# Patient Record
Sex: Female | Born: 1995 | Race: White | Hispanic: No | Marital: Married | Smoking: Never smoker
Health system: Southern US, Community
[De-identification: ages and names within clinical notes are randomized; demographics above are authoritative.]

## PROBLEM LIST (undated history)

## (undated) ENCOUNTER — Inpatient Hospital Stay (HOSPITAL_COMMUNITY): Payer: Self-pay

## (undated) DIAGNOSIS — N183 Chronic kidney disease, stage 3 unspecified: Secondary | ICD-10-CM

## (undated) DIAGNOSIS — N390 Urinary tract infection, site not specified: Secondary | ICD-10-CM

## (undated) DIAGNOSIS — E079 Disorder of thyroid, unspecified: Secondary | ICD-10-CM

## (undated) DIAGNOSIS — E0590 Thyrotoxicosis, unspecified without thyrotoxic crisis or storm: Secondary | ICD-10-CM

## (undated) DIAGNOSIS — E039 Hypothyroidism, unspecified: Secondary | ICD-10-CM

## (undated) DIAGNOSIS — M797 Fibromyalgia: Secondary | ICD-10-CM

## (undated) HISTORY — PX: WISDOM TOOTH EXTRACTION: SHX21

## (undated) HISTORY — PX: APPENDECTOMY: SHX54

## (undated) HISTORY — DX: Hypothyroidism, unspecified: E03.9

---
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
## 2015-09-23 ENCOUNTER — Encounter: Payer: Self-pay | Admitting: Obstetrics and Gynecology

## 2015-10-09 ENCOUNTER — Ambulatory Visit (INDEPENDENT_AMBULATORY_CARE_PROVIDER_SITE_OTHER): Payer: Medicaid Other | Admitting: Obstetrics and Gynecology

## 2015-10-09 ENCOUNTER — Encounter: Payer: Self-pay | Admitting: Obstetrics and Gynecology

## 2015-10-09 VITALS — BP 100/60 | HR 84 | Ht 59.0 in | Wt 172.0 lb

## 2015-10-09 DIAGNOSIS — E039 Hypothyroidism, unspecified: Secondary | ICD-10-CM | POA: Diagnosis not present

## 2015-10-09 DIAGNOSIS — N970 Female infertility associated with anovulation: Secondary | ICD-10-CM

## 2015-10-09 DIAGNOSIS — N926 Irregular menstruation, unspecified: Secondary | ICD-10-CM | POA: Diagnosis not present

## 2015-10-09 NOTE — Progress Notes (Signed)
Patient ID: Hailey Thompson, female   DOB: 1995-11-21, 20 y.o.   MRN: [id number]   [facility] Visit  @DATE @            Patient name: Hailey Thompson MRN [id number]  Date of birth: 02-09-1996  CC & HPI:  Hailey Thompson is a 20 y.o. female presenting today for shortening menstrual cycles. Pt states her cycles have gotten shorter and she is concerned for fertility. She has been tracking her periods through an app and notes her cycles have decreased from 29-32 to 22-29 days since January 2017. Pt has also noticed new spotting then an increase in bleeding during periods. Last period June 24-30. Pt states she does not skip periods. Pt has one child delivered through cesarean section due to breech presentation. She reports complications with her incision following cesarean section with 3 large stromas. Pt has recently adjusted levothyroxine dosage levels to 112 mg/day, managed by Roberto Scales at [facility].   ROS:  Review of Systems  Genitourinary:       Short menstrual cycles  All other systems reviewed and are negative.   Pertinent History Reviewed:   Reviewed: Significant for cesarean section Medical         Past Medical History  Diagnosis Date  . Migraine   . Thyroid disease   . UTI (lower urinary tract infection)   . Overactive thyroid gland                               Surgical Hx:    Past Surgical History  Procedure Laterality Date  . Appendectomy    . Cesarean section     Medications: Reviewed & Updated - see associated section                       Current outpatient prescriptions:  .  acetaminophen (TYLENOL) 500 MG tablet, Take 500 mg by mouth every 6 (six) hours as needed for headache., Disp: , Rfl:  .  levothyroxine (SYNTHROID, LEVOTHROID) 125 MCG tablet, Take 1 tablet (125 mcg total) by mouth daily., Disp: 30 tablet, Rfl: 6 .  [DISCONTINUED] famotidine (PEPCID) 20 MG tablet, Take 1 tablet (20 mg total) by mouth 2 (two) times daily., Disp: 20 tablet, Rfl:  0   Social History: Reviewed -  reports that she has never smoked. She has never used smokeless tobacco.  Objective Findings:  Vitals: Blood pressure 100/60, pulse 84, height 4\' 11"  (1.499 m), weight 172 lb (78.019 kg), last menstrual period 09/19/2015, unknown if currently breastfeeding.  Physical Examination:  General appearance - alert, well appearing, and in no distress, oriented to person, place, and time and overweight Skin - normal coloration and turgor, no rashes, no suspicious skin lesions noted   Assessment & Plan:   A:  1. Hypothyroidism 2. Possible oligoovulation  P:  1. Continue to manage hypothyroidism 2. Recommended My [email or web address] [email or web address] and ovulation guides 3. Draw progesterone level 7 days after ovulation     By signing my name below, I, Rowan Blase, attest that this documentation has been prepared under the direction and in the presence of Jonnie Kind, MD . Electronically Signed: Rowan Blase, Scribe. 10/09/2015. 10:04 AM.  Alecia Lemming I personally performed the services described in this documentation, which was SCRIBED in my presence. The recorded information has been reviewed and considered accurate. It has been edited as necessary during review. Jonnie Kind, MD

## 2015-10-22 ENCOUNTER — Emergency Department (HOSPITAL_COMMUNITY)
Admission: EM | Admit: 2015-10-22 | Discharge: 2015-10-23 | Disposition: A | Discharge status: Home / Self Care | Payer: Medicaid Other | Attending: Emergency Medicine | Admitting: Emergency Medicine

## 2015-10-22 ENCOUNTER — Encounter (HOSPITAL_COMMUNITY): Payer: Self-pay

## 2015-10-22 ENCOUNTER — Emergency Department (HOSPITAL_COMMUNITY): Payer: Medicaid Other

## 2015-10-22 DIAGNOSIS — M549 Dorsalgia, unspecified: Secondary | ICD-10-CM | POA: Insufficient documentation

## 2015-10-22 DIAGNOSIS — R1011 Right upper quadrant pain: Secondary | ICD-10-CM

## 2015-10-22 DIAGNOSIS — E039 Hypothyroidism, unspecified: Secondary | ICD-10-CM | POA: Insufficient documentation

## 2015-10-22 DIAGNOSIS — K8020 Calculus of gallbladder without cholecystitis without obstruction: Secondary | ICD-10-CM | POA: Insufficient documentation

## 2015-10-22 DIAGNOSIS — Z79899 Other long term (current) drug therapy: Secondary | ICD-10-CM | POA: Diagnosis not present

## 2015-10-22 LAB — URINALYSIS, ROUTINE W REFLEX MICROSCOPIC
Bilirubin Urine: NEGATIVE
GLUCOSE, UA: NEGATIVE mg/dL
KETONES UR: NEGATIVE mg/dL
Nitrite: NEGATIVE
PH: 6 (ref 5.0–8.0)
PROTEIN: NEGATIVE mg/dL
Specific Gravity, Urine: 1.015 (ref 1.005–1.030)

## 2015-10-22 LAB — COMPREHENSIVE METABOLIC PANEL
ALT: 19 U/L (ref 14–54)
ANION GAP: 4 — AB (ref 5–15)
AST: 22 U/L (ref 15–41)
Albumin: 3.3 g/dL — ABNORMAL LOW (ref 3.5–5.0)
Alkaline Phosphatase: 93 U/L (ref 38–126)
BILIRUBIN TOTAL: 0.2 mg/dL — AB (ref 0.3–1.2)
BUN: 13 mg/dL (ref 6–20)
CHLORIDE: 105 mmol/L (ref 101–111)
CO2: 25 mmol/L (ref 22–32)
Calcium: 8.1 mg/dL — ABNORMAL LOW (ref 8.9–10.3)
Creatinine, Ser: 0.78 mg/dL (ref 0.44–1.00)
Glucose, Bld: 105 mg/dL — ABNORMAL HIGH (ref 65–99)
POTASSIUM: 3.9 mmol/L (ref 3.5–5.1)
Sodium: 134 mmol/L — ABNORMAL LOW (ref 135–145)
TOTAL PROTEIN: 7.6 g/dL (ref 6.5–8.1)

## 2015-10-22 LAB — CBC
HEMATOCRIT: 35 % — AB (ref 36.0–46.0)
Hemoglobin: 10.7 g/dL — ABNORMAL LOW (ref 12.0–15.0)
MCH: 24 pg — ABNORMAL LOW (ref 26.0–34.0)
MCHC: 30.6 g/dL (ref 30.0–36.0)
MCV: 78.7 fL (ref 78.0–100.0)
Platelets: 324 10*3/uL (ref 150–400)
RBC: 4.45 MIL/uL (ref 3.87–5.11)
RDW: 15.1 % (ref 11.5–15.5)
WBC: 13.6 10*3/uL — AB (ref 4.0–10.5)

## 2015-10-22 LAB — URINE MICROSCOPIC-ADD ON

## 2015-10-22 LAB — LIPASE, BLOOD: LIPASE: 173 U/L — AB (ref 11–51)

## 2015-10-22 LAB — PREGNANCY, URINE: Preg Test, Ur: NEGATIVE

## 2015-10-22 MED ORDER — IOPAMIDOL (ISOVUE-300) INJECTION 61%
100.0000 mL | Freq: Once | INTRAVENOUS | Status: AC | PRN
  Administered 2015-10-23: 100 mL via INTRAVENOUS

## 2015-10-22 NOTE — ED Triage Notes (Signed)
Reports of upper abdominal pain that radiates to back. Denies n/v/d. Reports pain increases after eating.

## 2015-10-22 NOTE — ED Provider Notes (Signed)
[facility] DEPT Provider Note   CSN: [id number] Arrival date & time: 10/22/15  2104  First Provider Contact:  First MD Initiated Contact with Patient 10/22/15 2252        History   Chief Complaint Chief Complaint  Patient presents with  . Abdominal Pain    HPI Hailey Thompson is a 20 y.o. female.    Pain started yesterday about lunch. The pain felt like a pushing sensation. Pain got worse last night and even more intense today. No vomiting, No diarrhea, No constipation. No injury to abd or back. Pt has similar pain during pregnancy. It was suggested that she may have a gall bladder problem. She did not have this checked during this time.  Pt reports having tatar tots for lunch yesterday. Dinner was potatoes and gravy, and steak and squash. Pt does not eat breakfast.  No blood in the stool or the urine.   The history is provided by the patient.    Past Medical History:  Diagnosis Date  . Migraine   . Overactive thyroid gland   . Thyroid disease   . UTI (lower urinary tract infection)     Patient Active Problem List   Diagnosis Date Noted  . Irregular menses 05/01/2013  . Vitiligo 10/25/2011  . Short stature 07/19/2011  . Hypothyroidism 07/19/2011  . Obesity 07/19/2011  . Hypercholesterolemia with hypertriglyceridemia 07/19/2011    Past Surgical History:  Procedure Laterality Date  . APPENDECTOMY    . CESAREAN SECTION      OB History    Gravida Para Term Preterm AB Living   1             SAB TAB Ectopic Multiple Live Births                   Home Medications    Prior to Admission medications   Medication Sig Start Date End Date Taking? Authorizing Provider  acetaminophen (TYLENOL) 500 MG tablet Take 500 mg by mouth every 6 (six) hours as needed for headache.    Historical Provider, MD  levothyroxine (SYNTHROID, LEVOTHROID) 125 MCG tablet Take 1 tablet (125 mcg total) by mouth daily. 08/09/13   Lelon Huh, MD    Family History Family History    Problem Relation Age of Onset  . Short stature Sister     s/p kidney failure  . Kidney disease Sister   . Deafness Sister   . Diabetes Mother     type 1 dx as young child. now with multiple complications  . Thyroid disease Mother   . Deep vein thrombosis Mother   . Cancer Maternal Grandmother     breast  . Cancer Maternal Grandfather   . Short stature Sister     paternal half sister 42'8"    Social History Social History  Substance Use Topics  . Smoking status: Never Smoker  . Smokeless tobacco: Never Used  . Alcohol use No     Allergies   Mushroom extract complex; Chocolate; Oxycodone; and Prednisone   Review of Systems Review of Systems  Gastrointestinal: Positive for abdominal pain. Negative for blood in stool, constipation, nausea and vomiting.  Musculoskeletal: Positive for back pain.  All other systems reviewed and are negative.    Physical Exam Updated Vital Signs BP 119/74   Pulse 93   Temp 98.4 F (36.9 C) (Oral)   Resp 20   Ht 4\' 6"  (1.372 m)   Wt 78 kg   LMP 09/13/2015   SpO2  99%   BMI 41.47 kg/m   Physical Exam  Constitutional: She is oriented to person, place, and time. She appears well-developed and well-nourished.  Non-toxic appearance.  HENT:  Head: Normocephalic.  Right Ear: Tympanic membrane and external ear normal.  Left Ear: Tympanic membrane and external ear normal.  Eyes: EOM and lids are normal. Pupils are equal, round, and reactive to light.  Neck: Normal range of motion. Neck supple. Carotid bruit is not present.  Cardiovascular: Normal rate, regular rhythm, normal heart sounds, intact distal pulses and normal pulses.   Pulmonary/Chest: Breath sounds normal. No respiratory distress.  Abdominal: Soft. Bowel sounds are normal. There is tenderness in the right upper quadrant and epigastric area. There is no rigidity, no guarding and no CVA tenderness.  Musculoskeletal: Normal range of motion.  Lymphadenopathy:       Head (right  side): No submandibular adenopathy present.       Head (left side): No submandibular adenopathy present.    She has no cervical adenopathy.  Neurological: She is alert and oriented to person, place, and time. She has normal strength. No cranial nerve deficit or sensory deficit.  Skin: Skin is warm and dry.  Psychiatric: She has a normal mood and affect. Her speech is normal.  Nursing note and vitals reviewed.    ED Treatments / Results  Labs (all labs ordered are listed, but only abnormal results are displayed) Labs Reviewed  LIPASE, BLOOD - Abnormal; Notable for the following:       Result Value   Lipase 173 (*)    All other components within normal limits  COMPREHENSIVE METABOLIC PANEL - Abnormal; Notable for the following:    Sodium 134 (*)    Glucose, Bld 105 (*)    Calcium 8.1 (*)    Albumin 3.3 (*)    Total Bilirubin 0.2 (*)    Anion gap 4 (*)    All other components within normal limits  CBC - Abnormal; Notable for the following:    WBC 13.6 (*)    Hemoglobin 10.7 (*)    HCT 35.0 (*)    MCH 24.0 (*)    All other components within normal limits  URINALYSIS, ROUTINE W REFLEX MICROSCOPIC (NOT AT [facility]) - Abnormal; Notable for the following:    Hgb urine dipstick MODERATE (*)    Leukocytes, UA TRACE (*)    All other components within normal limits  URINE MICROSCOPIC-ADD ON - Abnormal; Notable for the following:    Squamous Epithelial / LPF 6-30 (*)    Bacteria, UA MANY (*)    All other components within normal limits  PREGNANCY, URINE    EKG  EKG Interpretation None       Radiology No results found.  Procedures Procedures (including critical care time)  Medications Ordered in ED Medications - No data to display   Initial Impression / Assessment and Plan / ED Course  I have reviewed the triage vital signs and the nursing notes.  Pertinent labs & imaging results that were available during my care of the patient were reviewed by me and considered in my  medical decision making (see chart for details).  Clinical Course    *I have reviewed nursing notes, vital signs, and all appropriate lab and imaging results for this patient.  Final Clinical Impressions(s) / ED Diagnoses  There is elevation of the white blood cell count. There is elevation of the lipase at 173. Urine pregnancy is negative. Urinalysis is negative for kidney stone  or urinary tract infection. CT scan reveals a contracted gallbladder, but otherwise within normal limits.  The plan at this time is for the patient to have an ultrasound of the right upper quadrant as an outpatient. The patient is scheduled for 3:15 on July 28. I discussed with the patient the importance of remaining nothing by mouth after 10 AM. I also discussed with patient the need to come to the waiting area of the emergency department after her tests to receive her results. I discussed with the patient the possibility of need for consultation with general surgery, and/or GI after the results of the ultrasound and then released. Patient is in agreement with this discharge plan. A to go pack of Zofran is been given to the patient.    Final diagnoses:  Right upper quadrant pain    New Prescriptions New Prescriptions   No medications on file     Lily Kocher, PA-C 10/23/15 0122    Daleen Bo, MD 10/27/15 [phone]

## 2015-10-23 ENCOUNTER — Telehealth (HOSPITAL_COMMUNITY): Payer: Self-pay | Admitting: Emergency Medicine

## 2015-10-23 ENCOUNTER — Ambulatory Visit (HOSPITAL_COMMUNITY)
Admit: 2015-10-23 | Discharge: 2015-10-23 | Disposition: A | Discharge status: Home / Self Care | Payer: Medicaid Other | Attending: Physician Assistant | Admitting: Physician Assistant

## 2015-10-23 DIAGNOSIS — R1011 Right upper quadrant pain: Secondary | ICD-10-CM | POA: Insufficient documentation

## 2015-10-23 DIAGNOSIS — K8020 Calculus of gallbladder without cholecystitis without obstruction: Secondary | ICD-10-CM

## 2015-10-23 MED ORDER — ONDANSETRON 4 MG PREPACK (~~LOC~~): ORAL_TABLET | ORAL | 0 refills | Status: DC

## 2015-10-23 MED ORDER — HYDROCODONE-ACETAMINOPHEN 5-325 MG PO TABS: 1.0000 | ORAL_TABLET | ORAL | 0 refills | Status: DC | PRN

## 2015-10-23 NOTE — Telephone Encounter (Signed)
Pt returning for RUQ [place]. Cholelithiasis. No pericholecystic fluid. Normal diameter CBD. Symptoms improved. Script for PRN pain meds provided. New instructions with general surgery contact information. Return precautions discussed.

## 2015-10-23 NOTE — Discharge Instructions (Addendum)
Your white blood cell count is elevated. Your lipase, a test that monitors your pancreas is also elevated. Your CT scan reveals your gallbladder to be contracted. An ultrasound of your abdomen has been ordered. Please come to the radiology department at 3:15 on July 28. Please do not eat or drink after 10 AM. Please use Tylenol for soreness or fever. Use Zofran for nausea.

## 2015-10-23 NOTE — ED Provider Notes (Signed)
Pt returned for RUQ [place]. Cholelithiasis. No GB wall thickening. No pericholecystic fluid. CBD normal diameter. Will be discharged with PRN pain meds and general surgical contact information. Results and return precautions discussed.    Virgel Manifold, MD 10/23/15 1524

## 2015-11-03 NOTE — Patient Instructions (Signed)
Hailey Thompson  11/03/2015     @PREFPERIOPPHARMACY @   Your procedure is scheduled on  11/06/2015   Report to [facility] at  730  A.M.  Call this number if you have problems the morning of surgery:  [phone]   Remember:  Do not eat food or drink liquids after midnight.  Take these medicines the morning of surgery with A SIP OF WATER norco, levothyroxine, zofran.   Do not wear jewelry, make-up or nail polish.  Do not wear lotions, powders, or perfumes.  You may wear deoderant.  Do not shave 48 hours prior to surgery.  Men may shave face and neck.  Do not bring valuables to the hospital.  [facility] is not responsible for any belongings or valuables.  Contacts, dentures or bridgework may not be worn into surgery.  Leave your suitcase in the car.  After surgery it may be brought to your room.  For patients admitted to the hospital, discharge time will be determined by your treatment team.  Patients discharged the day of surgery will not be allowed to drive home.   Name and phone number of your driver:   family Special instructions:  none  Please read over the following fact sheets that you were given. Anesthesia Post-op Instructions and Care and Recovery After Surgery       Laparoscopic Cholecystectomy Laparoscopic cholecystectomy is surgery to remove the gallbladder. The gallbladder is located in the upper right part of the abdomen, behind the liver. It is a storage sac for bile, which is produced in the liver. Bile aids in the digestion and absorption of fats. Cholecystectomy is often done for inflammation of the gallbladder (cholecystitis). This condition is usually caused by a buildup of gallstones (cholelithiasis) in the gallbladder. Gallstones can block the flow of bile, and that can result in inflammation and pain. In severe cases, emergency surgery may be required. If emergency surgery is not required, you will have time to prepare for the  procedure. Laparoscopic surgery is an alternative to open surgery. Laparoscopic surgery has a shorter recovery time. Your common bile duct may also need to be examined during the procedure. If stones are found in the common bile duct, they may be removed. LET [facility] CARE PROVIDER KNOW ABOUT:  Any allergies you have.  All medicines you are taking, including vitamins, herbs, eye drops, creams, and over-the-counter medicines.  Previous problems you or members of your family have had with the use of anesthetics.  Any blood disorders you have.  Previous surgeries you have had.  Any medical conditions you have. RISKS AND COMPLICATIONS Generally, this is a safe procedure. However, problems may occur, including:  Infection.  Bleeding.  Allergic reactions to medicines.  Damage to other structures or organs.  A stone remaining in the common bile duct.  A bile leak from the cyst duct that is clipped when your gallbladder is removed.  The need to convert to open surgery, which requires a larger incision in the abdomen. This may be necessary if your surgeon thinks that it is not safe to continue with a laparoscopic procedure. BEFORE THE PROCEDURE  Ask your health care provider about:  Changing or stopping your regular medicines. This is especially important if you are taking diabetes medicines or blood thinners.  Taking medicines such as aspirin and ibuprofen. These medicines can thin your blood. Do not take these medicines before your procedure if your health care provider  instructs you not to.  Follow instructions from your health care provider about eating or drinking restrictions.  Let your health care provider know if you develop a cold or an infection before surgery.  Plan to have someone take you home after the procedure.  Ask your health care provider how your surgical site will be marked or identified.  You may be given antibiotic medicine to help prevent  infection. PROCEDURE  To reduce your risk of infection:  Your health care team will wash or sanitize their hands.  Your skin will be washed with soap.  An IV tube may be inserted into one of your veins.  You will be given a medicine to make you fall asleep (general anesthetic).  A breathing tube will be placed in your mouth.  The surgeon will make several small cuts (incisions) in your abdomen.  A thin, lighted tube (laparoscope) that has a tiny camera on the end will be inserted through one of the small incisions. The camera on the laparoscope will send a picture to a TV screen (monitor) in the operating room. This will give the surgeon a good view inside your abdomen.  A gas will be pumped into your abdomen. This will expand your abdomen to give the surgeon more room to perform the surgery.  Other tools that are needed for the procedure will be inserted through the other incisions. The gallbladder will be removed through one of the incisions.  After your gallbladder has been removed, the incisions will be closed with stitches (sutures), staples, or skin glue.  Your incisions may be covered with a bandage (dressing). The procedure may vary among health care providers and hospitals. AFTER THE PROCEDURE  Your blood pressure, heart rate, breathing rate, and blood oxygen level will be monitored often until the medicines you were given have worn off.  You will be given medicines as needed to control your pain.   This information is not intended to replace advice given to you by your health care provider. Make sure you discuss any questions you have with your health care provider.   Document Released: 03/14/2005 Document Revised: 12/03/2014 Document Reviewed: 10/24/2012 Elsevier Interactive Patient Education 2016 [employer]  Laparoscopic Cholecystectomy, Care After Refer to this sheet in the next few weeks. These instructions provide you with information about caring for  yourself after your procedure. Your health care provider may also give you more specific instructions. Your treatment has been planned according to current medical practices, but problems sometimes occur. Call your health care provider if you have any problems or questions after your procedure. WHAT TO EXPECT AFTER THE PROCEDURE After your procedure, it is common to have:  Pain at your incision sites. You will be given pain medicines to control your pain.  Mild nausea or vomiting. This should improve after the first 24 hours.  Bloating and possible shoulder pain from the gas that was used during the procedure. This will improve after the first 24 hours. HOME CARE INSTRUCTIONS Incision Care  Follow instructions from your health care provider about how to take care of your incisions. Make sure you:  Wash your hands with soap and water before you change your bandage (dressing). If soap and water are not available, use hand sanitizer.  Change your dressing as told by your health care provider.  Leave stitches (sutures), skin glue, or adhesive strips in place. These skin closures may need to be in place for 2 weeks or longer. If adhesive strip edges start  to loosen and curl up, you may trim the loose edges. Do not remove adhesive strips completely unless your health care provider tells you to do that.  Do not take baths, swim, or use a hot tub until your health care provider approves. Ask your health care provider if you can take showers. You may only be allowed to take sponge baths for bathing. General Instructions  Take over-the-counter and prescription medicines only as told by your health care provider.  Do not drive or operate heavy machinery while taking prescription pain medicine.  Return to your normal diet as told by your health care provider.  Do not lift anything that is heavier than 10 lb (4.5 kg).  Do not play contact sports for one week or until your health care provider  approves. SEEK MEDICAL CARE IF:   You have redness, swelling, or pain at the site of your incision.  You have fluid, blood, or pus coming from your incision.  You notice a bad smell coming from your incision area.  Your surgical incisions break open.  You have a fever. SEEK IMMEDIATE MEDICAL CARE IF:  You develop a rash.  You have difficulty breathing.  You have chest pain.  You have increasing pain in your shoulders (shoulder strap areas).  You faint or have dizzy episodes while you are standing.  You have severe pain in your abdomen.  You have nausea or vomiting that lasts for more than one day.   This information is not intended to replace advice given to you by your health care provider. Make sure you discuss any questions you have with your health care provider.   Document Released: 03/14/2005 Document Revised: 12/03/2014 Document Reviewed: 10/24/2012 Elsevier Interactive Patient Education 2016 [place] Anesthesia, Adult General anesthesia is a sleep-like state of non-feeling produced by medicines (anesthetics). General anesthesia prevents you from being alert and feeling pain during a medical procedure. Your caregiver may recommend general anesthesia if your procedure:  Is long.  Is painful or uncomfortable.  Would be frightening to see or hear.  Requires you to be still.  Affects your breathing.  Causes significant blood loss. LET YOUR CAREGIVER KNOW ABOUT:  Allergies to food or medicine.  Medicines taken, including vitamins, herbs, eyedrops, over-the-counter medicines, and creams.  Use of steroids (by mouth or creams).  Previous problems with anesthetics or numbing medicines, including problems experienced by relatives.  History of bleeding problems or blood clots.  Previous surgeries and types of anesthetics received.  Possibility of pregnancy, if this applies.  Use of cigarettes, alcohol, or illegal drugs.  Any health  condition(s), especially diabetes, sleep apnea, and high blood pressure. RISKS AND COMPLICATIONS General anesthesia rarely causes complications. However, if complications do occur, they can be life threatening. Complications include:  A lung infection.  A stroke.  A heart attack.  Waking up during the procedure. When this occurs, the patient may be unable to move and communicate that he or she is awake. The patient may feel severe pain. Older adults and adults with serious medical problems are more likely to have complications than adults who are young and healthy. Some complications can be prevented by answering all of your caregiver's questions thoroughly and by following all pre-procedure instructions. It is important to tell your caregiver if any of the pre-procedure instructions, especially those related to diet, were not followed. Any food or liquid in the stomach can cause problems when you are under general anesthesia. BEFORE THE PROCEDURE  Ask your  caregiver if you will have to spend the night at the hospital. If you will not have to spend the night, arrange to have an adult drive you and stay with you for 24 hours.  Follow your caregiver's instructions if you are taking dietary supplements or medicines. Your caregiver may tell you to stop taking them or to reduce your dosage.  Do not smoke for as long as possible before your procedure. If possible, stop smoking 3-6 weeks before the procedure.  Do not take new dietary supplements or medicines within 1 week of your procedure unless your caregiver approves them.  Do not eat within 8 hours of your procedure or as directed by your caregiver. Drink only clear liquids, such as water, black coffee (without milk or cream), and fruit juices (without pulp).  Do not drink within 3 hours of your procedure or as directed by your caregiver.  You may brush your teeth on the morning of the procedure, but make sure to spit out the toothpaste and  water when finished. PROCEDURE  You will receive anesthetics through a mask, through an intravenous (IV) access tube, or through both. A doctor who specializes in anesthesia (anesthesiologist) or a nurse who specializes in anesthesia (nurse anesthetist) or both will stay with you throughout the procedure to make sure you remain unconscious. He or she will also watch your blood pressure, pulse, and oxygen levels to make sure that the anesthetics do not cause any problems. Once you are asleep, a breathing tube or mask may be used to help you breathe. AFTER THE PROCEDURE You will wake up after the procedure is complete. You may be in the room where the procedure was performed or in a recovery area. You may have a sore throat if a breathing tube was used. You may also feel:  Dizzy.  Weak.  Drowsy.  Confused.  Nauseous.  Cold. These are all normal responses and can be expected to last for up to 24 hours after the procedure is complete. A caregiver will tell you when you are ready to go home. This will usually be when you are fully awake and in stable condition.   This information is not intended to replace advice given to you by your health care provider. Make sure you discuss any questions you have with your health care provider.   Document Released: 06/21/2007 Document Revised: 04/04/2014 Document Reviewed: 07/13/2011 Elsevier Interactive Patient Education 2016 [place] Anesthesia, Adult, Care After Refer to this sheet in the next few weeks. These instructions provide you with information on caring for yourself after your procedure. Your health care provider may also give you more specific instructions. Your treatment has been planned according to current medical practices, but problems sometimes occur. Call your health care provider if you have any problems or questions after your procedure. WHAT TO EXPECT AFTER THE PROCEDURE After the procedure, it is typical to  experience:  Sleepiness.  Nausea and vomiting. HOME CARE INSTRUCTIONS  For the first 24 hours after general anesthesia:  Have a responsible person with you.  Do not drive a car. If you are alone, do not take public transportation.  Do not drink alcohol.  Do not take medicine that has not been prescribed by your health care provider.  Do not sign important papers or make important decisions.  You may resume a normal diet and activities as directed by your health care provider.  Change bandages (dressings) as directed.  If you have questions or problems that  seem related to general anesthesia, call the hospital and ask for the anesthetist or anesthesiologist on call. SEEK MEDICAL CARE IF:  You have nausea and vomiting that continue the day after anesthesia.  You develop a rash. SEEK IMMEDIATE MEDICAL CARE IF:   You have difficulty breathing.  You have chest pain.  You have any allergic problems.   This information is not intended to replace advice given to you by your health care provider. Make sure you discuss any questions you have with your health care provider.   Document Released: 06/20/2000 Document Revised: 04/04/2014 Document Reviewed: 07/13/2011 Elsevier Interactive Patient Education [employer].

## 2015-11-04 ENCOUNTER — Encounter (HOSPITAL_COMMUNITY): Payer: Self-pay

## 2015-11-04 ENCOUNTER — Encounter (HOSPITAL_COMMUNITY)
Admission: RE | Admit: 2015-11-04 | Discharge: 2015-11-04 | Disposition: A | Discharge status: Home / Self Care | Payer: Medicaid Other | Source: Ambulatory Visit | Attending: Surgery | Admitting: Surgery

## 2015-11-04 DIAGNOSIS — Z01812 Encounter for preprocedural laboratory examination: Secondary | ICD-10-CM | POA: Insufficient documentation

## 2015-11-04 LAB — HCG, SERUM, QUALITATIVE: Preg, Serum: NEGATIVE

## 2015-11-04 NOTE — Pre-Procedure Instructions (Signed)
Patient given information to sign up for my chart at home. 

## 2015-11-06 ENCOUNTER — Ambulatory Visit (HOSPITAL_COMMUNITY): Payer: Medicaid Other | Admitting: Anesthesiology

## 2015-11-06 ENCOUNTER — Encounter (HOSPITAL_COMMUNITY)
Admission: RE | Disposition: A | Discharge status: Home / Self Care | Payer: Self-pay | Source: Ambulatory Visit | Attending: Surgery

## 2015-11-06 ENCOUNTER — Ambulatory Visit (HOSPITAL_COMMUNITY): Payer: Medicaid Other

## 2015-11-06 ENCOUNTER — Ambulatory Visit (HOSPITAL_COMMUNITY)
Admission: RE | Admit: 2015-11-06 | Discharge: 2015-11-06 | Disposition: A | Discharge status: Home / Self Care | Payer: Medicaid Other | Source: Ambulatory Visit | Attending: Surgery | Admitting: Surgery

## 2015-11-06 ENCOUNTER — Encounter (HOSPITAL_COMMUNITY): Payer: Self-pay | Admitting: *Deleted

## 2015-11-06 DIAGNOSIS — E039 Hypothyroidism, unspecified: Secondary | ICD-10-CM | POA: Insufficient documentation

## 2015-11-06 DIAGNOSIS — Z6841 Body Mass Index (BMI) 40.0 and over, adult: Secondary | ICD-10-CM | POA: Diagnosis not present

## 2015-11-06 DIAGNOSIS — Z8349 Family history of other endocrine, nutritional and metabolic diseases: Secondary | ICD-10-CM | POA: Diagnosis not present

## 2015-11-06 DIAGNOSIS — K8010 Calculus of gallbladder with chronic cholecystitis without obstruction: Secondary | ICD-10-CM | POA: Diagnosis not present

## 2015-11-06 DIAGNOSIS — Z01811 Encounter for preprocedural respiratory examination: Secondary | ICD-10-CM

## 2015-11-06 DIAGNOSIS — Z841 Family history of disorders of kidney and ureter: Secondary | ICD-10-CM | POA: Insufficient documentation

## 2015-11-06 DIAGNOSIS — Z809 Family history of malignant neoplasm, unspecified: Secondary | ICD-10-CM | POA: Diagnosis not present

## 2015-11-06 DIAGNOSIS — E785 Hyperlipidemia, unspecified: Secondary | ICD-10-CM | POA: Insufficient documentation

## 2015-11-06 DIAGNOSIS — Z833 Family history of diabetes mellitus: Secondary | ICD-10-CM | POA: Diagnosis not present

## 2015-11-06 DIAGNOSIS — G43909 Migraine, unspecified, not intractable, without status migrainosus: Secondary | ICD-10-CM | POA: Diagnosis not present

## 2015-11-06 HISTORY — PX: CHOLECYSTECTOMY: SHX55

## 2015-11-06 SURGERY — LAPAROSCOPIC CHOLECYSTECTOMY
Anesthesia: General

## 2015-11-06 MED ORDER — DEXAMETHASONE SODIUM PHOSPHATE 4 MG/ML IJ SOLN
INTRAMUSCULAR | Status: AC
  Filled 2015-11-06: qty 1

## 2015-11-06 MED ORDER — PROPOFOL 10 MG/ML IV BOLUS
INTRAVENOUS | Status: DC | PRN
  Administered 2015-11-06: 120 mg via INTRAVENOUS

## 2015-11-06 MED ORDER — FENTANYL CITRATE (PF) 250 MCG/5ML IJ SOLN
INTRAMUSCULAR | Status: AC
  Filled 2015-11-06: qty 5

## 2015-11-06 MED ORDER — MIDAZOLAM HCL 2 MG/2ML IJ SOLN
INTRAMUSCULAR | Status: AC
  Filled 2015-11-06: qty 2

## 2015-11-06 MED ORDER — MIDAZOLAM HCL 2 MG/2ML IJ SOLN
1.0000 mg | INTRAMUSCULAR | Status: DC | PRN
  Administered 2015-11-06 (×2): 1 mg via INTRAVENOUS

## 2015-11-06 MED ORDER — CHLORHEXIDINE GLUCONATE CLOTH 2 % EX PADS: 6.0000 | MEDICATED_PAD | Freq: Once | CUTANEOUS | Status: DC

## 2015-11-06 MED ORDER — ONDANSETRON HCL 4 MG/2ML IJ SOLN
INTRAMUSCULAR | Status: AC
  Filled 2015-11-06: qty 2

## 2015-11-06 MED ORDER — BUPIVACAINE HCL (PF) 0.5 % IJ SOLN
INTRAMUSCULAR | Status: AC
  Filled 2015-11-06: qty 30

## 2015-11-06 MED ORDER — NEOSTIGMINE METHYLSULFATE 10 MG/10ML IV SOLN
INTRAVENOUS | Status: DC | PRN
  Administered 2015-11-06: 3.5 mg via INTRAVENOUS

## 2015-11-06 MED ORDER — CEFAZOLIN SODIUM-DEXTROSE 2-4 GM/100ML-% IV SOLN
INTRAVENOUS | Status: AC
  Filled 2015-11-06: qty 100

## 2015-11-06 MED ORDER — LACTATED RINGERS IV SOLN
INTRAVENOUS | Status: DC
  Administered 2015-11-06 (×2): via INTRAVENOUS

## 2015-11-06 MED ORDER — GLYCOPYRROLATE 0.2 MG/ML IJ SOLN
INTRAMUSCULAR | Status: DC | PRN
  Administered 2015-11-06: .65 mg via INTRAVENOUS

## 2015-11-06 MED ORDER — ONDANSETRON HCL 4 MG/2ML IJ SOLN
4.0000 mg | Freq: Once | INTRAMUSCULAR | Status: AC
  Administered 2015-11-06: 4 mg via INTRAVENOUS

## 2015-11-06 MED ORDER — HYDROMORPHONE HCL 1 MG/ML IJ SOLN
0.2500 mg | INTRAMUSCULAR | Status: DC | PRN
  Administered 2015-11-06 (×2): 0.5 mg via INTRAVENOUS
  Filled 2015-11-06: qty 1

## 2015-11-06 MED ORDER — SUCCINYLCHOLINE CHLORIDE 20 MG/ML IJ SOLN
INTRAMUSCULAR | Status: AC
  Filled 2015-11-06: qty 1

## 2015-11-06 MED ORDER — OXYCODONE-ACETAMINOPHEN 5-325 MG PO TABS: 1.0000 | ORAL_TABLET | ORAL | 0 refills | Status: DC | PRN

## 2015-11-06 MED ORDER — ROCURONIUM BROMIDE 50 MG/5ML IV SOLN
INTRAVENOUS | Status: AC
  Filled 2015-11-06: qty 1

## 2015-11-06 MED ORDER — LIDOCAINE HCL 1 % IJ SOLN
INTRAMUSCULAR | Status: DC | PRN
  Administered 2015-11-06: 20 mL via INTRAMUSCULAR

## 2015-11-06 MED ORDER — LIDOCAINE HCL (PF) 1 % IJ SOLN
INTRAMUSCULAR | Status: AC
  Filled 2015-11-06: qty 30

## 2015-11-06 MED ORDER — HEMOSTATIC AGENTS (NO CHARGE) OPTIME
TOPICAL | Status: DC | PRN
  Administered 2015-11-06: 1 via TOPICAL

## 2015-11-06 MED ORDER — CEFAZOLIN SODIUM-DEXTROSE 2-4 GM/100ML-% IV SOLN
2.0000 g | INTRAVENOUS | Status: AC
  Administered 2015-11-06: 2 g via INTRAVENOUS

## 2015-11-06 MED ORDER — PROPOFOL 10 MG/ML IV BOLUS
INTRAVENOUS | Status: AC
  Filled 2015-11-06: qty 20

## 2015-11-06 MED ORDER — LIDOCAINE HCL (CARDIAC) 10 MG/ML IV SOLN
INTRAVENOUS | Status: DC | PRN
  Administered 2015-11-06: 40 mg via INTRAVENOUS

## 2015-11-06 MED ORDER — FENTANYL CITRATE (PF) 100 MCG/2ML IJ SOLN
INTRAMUSCULAR | Status: DC | PRN
  Administered 2015-11-06 (×2): 50 ug via INTRAVENOUS

## 2015-11-06 MED ORDER — DIPHENHYDRAMINE HCL 50 MG/ML IJ SOLN
25.0000 mg | Freq: Once | INTRAMUSCULAR | Status: AC
  Administered 2015-11-06: 25 mg via INTRAVENOUS

## 2015-11-06 MED ORDER — SUCCINYLCHOLINE 20MG/ML (10ML) SYRINGE FOR MEDFUSION PUMP - OPTIME
INTRAMUSCULAR | Status: DC | PRN
  Administered 2015-11-06: 100 mg via INTRAVENOUS

## 2015-11-06 MED ORDER — GLYCOPYRROLATE 0.2 MG/ML IJ SOLN
INTRAMUSCULAR | Status: AC
  Filled 2015-11-06: qty 3

## 2015-11-06 MED ORDER — ROCURONIUM 10MG/ML (10ML) SYRINGE FOR MEDFUSION PUMP - OPTIME
INTRAVENOUS | Status: DC | PRN
  Administered 2015-11-06: 24 mg via INTRAVENOUS
  Administered 2015-11-06: 6 mg via INTRAVENOUS

## 2015-11-06 MED ORDER — LIDOCAINE HCL (PF) 1 % IJ SOLN
INTRAMUSCULAR | Status: AC
  Filled 2015-11-06: qty 5

## 2015-11-06 MED ORDER — DIPHENHYDRAMINE HCL 50 MG/ML IJ SOLN
INTRAMUSCULAR | Status: AC
  Filled 2015-11-06: qty 1

## 2015-11-06 MED ORDER — SODIUM CHLORIDE 0.9 % IR SOLN
Status: DC | PRN
  Administered 2015-11-06: 1000 mL

## 2015-11-06 MED FILL — Oxycodone w/ Acetaminophen Tab 5-325 MG: ORAL | Qty: 6 | Status: CN

## 2015-11-06 MED FILL — Ondansetron HCl Tab 4 MG: ORAL | Qty: 4 | Status: AC

## 2015-11-06 SURGICAL SUPPLY — 45 items
APPLIER CLIP LAPSCP 10X32 DD (CLIP) ×3 IMPLANT
BAG HAMPER (MISCELLANEOUS) ×3 IMPLANT
CHLORAPREP W/TINT 26ML (MISCELLANEOUS) ×3 IMPLANT
CLOTH BEACON ORANGE TIMEOUT ST (SAFETY) ×3 IMPLANT
COVER LIGHT HANDLE STERIS (MISCELLANEOUS) ×6 IMPLANT
DECANTER SPIKE VIAL GLASS SM (MISCELLANEOUS) ×6 IMPLANT
DERMABOND ADVANCED (GAUZE/BANDAGES/DRESSINGS) ×2
DERMABOND ADVANCED .7 DNX12 (GAUZE/BANDAGES/DRESSINGS) ×1 IMPLANT
DEVICE TROCAR PUNCTURE CLOSURE (ENDOMECHANICALS) ×3 IMPLANT
ELECT REM PT RETURN 9FT ADLT (ELECTROSURGICAL) ×3
ELECTRODE REM PT RTRN 9FT ADLT (ELECTROSURGICAL) ×1 IMPLANT
FILTER SMOKE EVAC LAPAROSHD (FILTER) ×3 IMPLANT
FORMALIN 10 PREFIL 120ML (MISCELLANEOUS) ×3 IMPLANT
GLOVE BIOGEL M 7.0 STRL (GLOVE) ×3 IMPLANT
GLOVE BIOGEL PI IND STRL 7.0 (GLOVE) ×3 IMPLANT
GLOVE BIOGEL PI IND STRL 7.5 (GLOVE) ×1 IMPLANT
GLOVE BIOGEL PI INDICATOR 7.0 (GLOVE) ×6
GLOVE BIOGEL PI INDICATOR 7.5 (GLOVE) ×2
GLOVE ECLIPSE 6.5 STRL STRAW (GLOVE) ×3 IMPLANT
GLOVE ECLIPSE 7.0 STRL STRAW (GLOVE) ×3 IMPLANT
GOWN STRL REUS W/ TWL XL LVL3 (GOWN DISPOSABLE) ×1 IMPLANT
GOWN STRL REUS W/TWL LRG LVL3 (GOWN DISPOSABLE) ×6 IMPLANT
GOWN STRL REUS W/TWL XL LVL3 (GOWN DISPOSABLE) ×2
HEMOSTAT SNOW SURGICEL 2X4 (HEMOSTASIS) ×3 IMPLANT
INST SET LAPROSCOPIC AP (KITS) ×3 IMPLANT
IV NS IRRIG 3000ML ARTHROMATIC (IV SOLUTION) IMPLANT
KIT ROOM TURNOVER APOR (KITS) ×3 IMPLANT
MANIFOLD NEPTUNE II (INSTRUMENTS) ×3 IMPLANT
NEEDLE INSUFFLATION 14GA 120MM (NEEDLE) ×3 IMPLANT
NS IRRIG 1000ML POUR BTL (IV SOLUTION) ×3 IMPLANT
PACK LAP CHOLE LZT030E (CUSTOM PROCEDURE TRAY) ×3 IMPLANT
PAD ARMBOARD 7.5X6 YLW CONV (MISCELLANEOUS) ×3 IMPLANT
POUCH SPECIMEN RETRIEVAL 10MM (ENDOMECHANICALS) ×3 IMPLANT
SET BASIN LINEN APH (SET/KITS/TRAYS/PACK) ×3 IMPLANT
SET TUBE IRRIG SUCTION NO TIP (IRRIGATION / IRRIGATOR) IMPLANT
SLEEVE ENDOPATH XCEL 5M (ENDOMECHANICALS) ×6 IMPLANT
SUT VIC AB 4-0 PS2 27 (SUTURE) ×3 IMPLANT
SUT VICRYL 0 UR6 27IN ABS (SUTURE) ×3 IMPLANT
SUT VICRYL AB 3-0 FS1 BRD 27IN (SUTURE) ×3 IMPLANT
TROCAR ENDO BLADELESS 11MM (ENDOMECHANICALS) ×3 IMPLANT
TROCAR XCEL NON-BLD 5MMX100MML (ENDOMECHANICALS) ×3 IMPLANT
TUBE CONNECTING 12'X1/4 (SUCTIONS)
TUBE CONNECTING 12X1/4 (SUCTIONS) IMPLANT
TUBING INSUFFLATION (TUBING) ×3 IMPLANT
WARMER LAPAROSCOPE (MISCELLANEOUS) ×3 IMPLANT

## 2015-11-06 NOTE — Anesthesia Procedure Notes (Signed)
Procedure Name: Intubation Date/Time: 11/06/2015 9:45 AM Performed by: Tressie Stalker E Pre-anesthesia Checklist: Patient identified, Patient being monitored, Timeout performed, Emergency Drugs available and Suction available Patient Re-evaluated:Patient Re-evaluated prior to inductionOxygen Delivery Method: Circle system utilized Preoxygenation: Pre-oxygenation with 100% oxygen Intubation Type: IV induction Ventilation: Mask ventilation without difficulty Laryngoscope Size: Mac and 3 Grade View: Grade I Tube type: Oral Tube size: 7.0 mm Number of attempts: 1 Placement Confirmation: ETT inserted through vocal cords under direct vision,  positive ETCO2 and breath sounds checked- equal and bilateral Secured at: 21 cm Tube secured with: Tape Dental Injury: Teeth and Oropharynx as per pre-operative assessment

## 2015-11-06 NOTE — Discharge Instructions (Signed)
In addition to included general post-operative instructions for Laparoscopic Cholecystectomy,  Diet: Resume home heart healthy diet.   Activity: No heavy lifting (children, pets, laundry) or strenuous activity until follow-up, but light activity and walking are encouraged. Do not drive or drink alcohol if taking narcotic pain medications.   Wound care: 2 days after surgery (Sunday, 8/13), may shower/get incision wet with soapy water and pat dry (do not rub incisions), but no baths or submerging incision underwater until follow-up.   Medications: Resume all home medications. For mild to moderate pain: acetaminophen (Tylenol) or ibuprofen (if no kidney disease). Narcotic pain medications, if prescribed, can be used for severe pain, though may cause nausea, constipation, and drowsiness. Do not combine Tylenol and Percocet within a 6 hour period as Percocet contains Tylenol.  Call office [phone]) at any time if any questions, worsening pain, fevers/chills, bleeding, drainage from incision site, or other concerns.

## 2015-11-06 NOTE — Anesthesia Preprocedure Evaluation (Signed)
Anesthesia Evaluation  Patient identified by MRN, date of birth, ID band Patient awake    Reviewed: Allergy & Precautions, NPO status , Patient's Chart, lab work & pertinent test results  Airway Mallampati: II  TM Distance: >3 FB     Dental  (+) Poor Dentition, Chipped, Dental Advisory Given,    Pulmonary neg pulmonary ROS,    breath sounds clear to auscultation       Cardiovascular negative cardio ROS   Rhythm:Regular Rate:Normal     Neuro/Psych  Headaches,    GI/Hepatic negative GI ROS,   Endo/Other  Hypothyroidism Morbid obesity  Renal/GU      Musculoskeletal   Abdominal   Peds  Hematology   Anesthesia Other Findings   Reproductive/Obstetrics                             Anesthesia Physical Anesthesia Plan  ASA: II  Anesthesia Plan: General   Post-op Pain Management:    Induction: Intravenous  Airway Management Planned: Oral ETT  Additional Equipment:   Intra-op Plan:   Post-operative Plan: Extubation in OR  Informed Consent: I have reviewed the patients History and Physical, chart, labs and discussed the procedure including the risks, benefits and alternatives for the proposed anesthesia with the patient or authorized representative who has indicated his/her understanding and acceptance.     Plan Discussed with:   Anesthesia Plan Comments:         Anesthesia Quick Evaluation

## 2015-11-06 NOTE — Interval H&P Note (Signed)
History and Physical Interval Note:  11/06/2015 9:30 AM  Hailey Thompson  has presented today for surgery, with the diagnosis of cholelithiasis  The various methods of treatment have been discussed with the patient and family. After consideration of risks, benefits and other options for treatment, the patient has consented to  Procedure(s): LAPAROSCOPIC CHOLECYSTECTOMY (N/A) as a surgical intervention .  The patient's history has been reviewed, patient examined, no change in status, stable for surgery.  I have reviewed the patient's chart and labs.  Questions were answered to the patient's satisfaction.     Vickie Epley

## 2015-11-06 NOTE — Anesthesia Postprocedure Evaluation (Signed)
Anesthesia Post Note  Patient: Hailey Thompson  Procedure(s) Performed: Procedure(s) (LRB): LAPAROSCOPIC CHOLECYSTECTOMY (N/A)  Patient location during evaluation: PACU Anesthesia Type: General Level of consciousness: awake and alert and oriented Pain management: pain level controlled Vital Signs Assessment: post-procedure vital signs reviewed and stable Respiratory status: spontaneous breathing Cardiovascular status: blood pressure returned to baseline Postop Assessment: no signs of nausea or vomiting Anesthetic complications: no    Last Vitals:  Vitals:   11/06/15 1130 11/06/15 1140  BP: 121/61   Pulse: 72 75  Resp: (!) 24 (!) 24  Temp:      Last Pain:  Vitals:   11/06/15 1140  TempSrc:   PainSc: Asleep                 Latrish Mogel

## 2015-11-06 NOTE — Op Note (Signed)
SURGICAL OPERATIVE REPORT   DATE OF PROCEDURE: 11/06/2015  ATTENDING Surgeon(s): Vickie Epley, MD  ANESTHESIA: GETA  PRE-OPERATIVE DIAGNOSIS: Symptomatic Cholelithiasis  POST-OPERATIVE DIAGNOSIS: Symptomatic Cholelithiasis  PROCEDURE(S):  1.) Laparoscopic Cholecystectomy  INTRAOPERATIVE FINDINGS: Abnormally dilated mildly inflamed gallbladder   INTRAOPERATIVE FLUIDS: 1000 mL crystalloid   ESTIMATED BLOOD LOSS: Minimal (<30 mL)   URINE OUTPUT: No foley  SPECIMENS: Gallbladder  IMPLANTS: None  DRAINS: None   COMPLICATIONS: None apparent   CONDITION AT COMPLETION: Hemodynamically stable and extubated  DISPOSITION: PACU   INDICATION(S) FOR PROCEDURE:  Patient is a 20 y.o. female who previously presented with post-prandial RUQ > epigastric abdominal pain after eating fatty foods in particular. Ultrasound suggested cholelithiasis. All risks, benefits, and alternatives to above elective procedures were discussed with the patient, who elected to proceed, and informed consent was accordingly obtained at that time.   DETAILS OF PROCEDURE:  Patient was brought to the operating suite and appropriately identified. General anesthesia was administered along with peri-operative prophylactic IV antibiotics, and endotracheal intubation was performed by anesthesiologist, along with NG/OG tube for gastric decompression. In supine position, operative site was prepped and draped in usual sterile fashion, and following a brief time out, initial 5 mm incision was made in a natural skin crease just above the umbilicus. Fascia was then elevated, and a Verress needle was inserted and its proper position confirmed using aspiration and saline meniscus test.  Upon insufflation of the abdominal cavity with carbon dioxide to a well-tolerated pressure of 12-15 mmHg, 5 mm peri-umbilical port followed by laparoscope were inserted and used to inspect the abdominal cavity and its contents with no injuries  from insertion of the first trochar noted. Three additional trocars were inserted, one at the epigastric position (10 mm) and two along the Right costal margin (5 mm). The table was then placed in reverse Trendelenburg position with the Right side up. Filmy adhesions between the gallbladder and omentum/duodenum/transverse colon were lysed using combined blunt and sharp dissection. The apex/dome of the gallbladder was grasped with an atraumatic grasper passed through the lateral port and retracted apically over the liver. The infundibulum was also grasped and retracted, exposing Calot's triangle. The peritoneum overlying the gallbladder infundibulum was incised and dissected free of surrounding peritoneal attachments, revealing the cystic duct and cystic artery, which were clipped twice on the patient side and once on the gallbladder specimen side close to the gallbladder. The gallbladder was then dissected from its peritoneal attachments to the liver using electrocautery, and the gallbladder was placed into a laparoscopic specimen bag and removed from the abdominal cavity via the epigastric port site. Hemostasis and secure placement of clips were confirmed, and intra-peritoneal cavity was inspected with no additional findings. Endoclose laparoscopic fascial closure device was then used to re-approximate fascia at the 10 mm epigastric port site.  All ports were then removed under direct visualization, and abdominal cavity was desuflated. All port sites were irrigated/cleaned, additional local anesthetic was injected at each incision, 3-0 Vicryl was used to re-approximate dermis at 10 mm port site(s), and subcuticular 4-0 Vicryl suture was used to re-approximate skin. Skin was then cleaned, dried, and sterile skin glue was applied. Patient was then safely able to be awakened, extubated, and transferred to PACU for post-operative monitoring and care.   I was present for all aspects of procedure, and there were no  intra-operative complications apparent.

## 2015-11-06 NOTE — H&P (Signed)
Surgical History and Physical  Subjective: This 20 year old female presents forpost-prandial abdominal pain that she notes is worse after fatty foods and began during her pregnancy, at which time she was advised her symptoms were likely due to her gallbladder. However, it was not until she experienced worse symptoms 10/21/2015 after eating tater tots, potatoes and gravy, and steak that she sought attention at the ED and was diagnosed with cholelithiasis. Since she was advised to avoid fatty foods, she has not since had recurrence of the pain. She expresses that she would like to have the problem fixed, so she doesn't have to worry about everything she eats. Of note, her WBC and lipase at the time of that presentation were 13.6 and 173, respectively, but CT was unrevealing, and patient was discharged with plans for outpatient RUQ abdominal ultrasound, which demonstrated cholelithiasis.   Review of Symptoms:  Constitutional:No fevers, chills, or unexplained weight loss Head:Atraumatic; no masses; no abnormalities Eyes:No visual changes or eye pain Cardiovascular: No chest pain or palpitations.  Respiratory:No cough, shortness of breath or wheezing  GastrointestinAbdominal pain as per HPI, no diarrhea, constipation, blood in stools, vomiting or heartburn Genitourinary:No urinary frequency, hematuria, incontinence, or dysuria Musculoskeletal:No arthalgias, myalgias or joint swelling Hematolgic/Lymphatic:No easy bruising, easy bleeding or swollen glands   Past Medical History:Reviewed  Past Medical History  Pregnancy Gravida:  1 Pregnancy Para:  1 Surgical History:  appendectomy, cesarean childbirth Medical Problems:  hypothyroidism, migraine headaches, obesity, hyperlipidemia, short stature   Social History:Reviewed  Social History  Preferred Language: English Race:  White Ethnicity: Not Hispanic / Latino Age: 48 year Marital Status:  M  Smoking Status: Never smoker  reviewed on 11/04/2015  Functional Status ------------------------------------------------ Bathing: Normal Cooking: Normal Dressing: Normal Driving: Normal Eating: Normal Managing Meds: Normal Oral Care: Normal Shopping: Normal Toileting: Normal Transferring: Normal Walking: Normal  Cognitive Status ------------------------------------------------ Attention: Normal Decision Making: Normal Language: Normal Memory: Normal Motor: Normal Perception: Normal Problem Solving: Normal Visual and Spatial: Normal   Family History:Reviewed  Family Health History Mother, Living; Diabetes mellitus, type 1 (childhood); Disorder of thyroid gland;  Father  Sister, Living; Short stature disorder; Complete deafness; Kidney disease;  Maternal Grandfather, Unknown; Cancer unspecified;  Maternal Grandmother, Unknown; Cancer unspecified;   Vital Signs as of [id number]:  Systolic 99991111: Diastolic 62: Heart Rate 95: Temp 98.28F (Temporal) Height 94ft 6in: Weight 173Lbs 0 Ounces   BMI: 41.71 kg/m2  Physical Exam: General:Well appearing, well nourished in no distress. Skin:no rash or prominent lesions Head:Atraumatic; no masses; no abnormalities Eyes:conjunctiva clear, EOM intact, PERRL Neck:Supple without lymphadenopathy.  Heart:RRR, no murmur Lungs:CTA bilaterally, no wheezes, rhonchi, rales.  Breathing unlabored. Abdomen:Soft, NT/ND, no HSM, no masses. Extremities:Short stature; otherwise no deformities, clubbing, cyanosis, or edema.   Assessment: 20 year old Female with symptomatic (painful) cholelithiasis (gallstones)   Plan:      - avoid/minimize fatty foods      - all risks, benefits, and alternatives to cholecystectomy discussed with the patient, who elects to proceed, and informed consent was obtained at that time      - will plan for elective outpatient laparoscopic cholecystectomy 8/11      - surgical follow-up 2 weeks after surgery  -- Corene Cornea E. Rosana Hoes, MD,  [place] Office #: [phone]

## 2015-11-06 NOTE — Transfer of Care (Signed)
Immediate Anesthesia Transfer of Care Note  Patient: Hailey Thompson  Procedure(s) Performed: Procedure(s): LAPAROSCOPIC CHOLECYSTECTOMY (N/A)  Patient Location: PACU  Anesthesia Type:General  Level of Consciousness: awake  Airway & Oxygen Therapy: Patient Spontanous Breathing and Patient connected to face mask oxygen  Post-op Assessment: Report given to RN  Post vital signs: Reviewed and stable  Last Vitals:  Vitals:   11/06/15 0845 11/06/15 0850  BP: (!) 96/54 (!) 102/58  Pulse:    Resp: 17 20  Temp:      Last Pain:  Vitals:   11/06/15 0738  TempSrc: Oral      Patients Stated Pain Goal: 6 ([id number])  Complications: No apparent anesthesia complications

## 2015-11-06 NOTE — Progress Notes (Signed)
Patient given versed and zofran IV in preop. After second dose of versed whelps noted to appear to left arm above IV access. Dr. Patsey Berthold notified. Patient allergic to prednisone. Benadryl given IV at this time.

## 2015-11-12 ENCOUNTER — Encounter (HOSPITAL_COMMUNITY): Payer: Self-pay | Admitting: Surgery

## 2015-12-10 ENCOUNTER — Ambulatory Visit (INDEPENDENT_AMBULATORY_CARE_PROVIDER_SITE_OTHER): Payer: Medicaid Other | Admitting: Adult Health

## 2015-12-10 ENCOUNTER — Encounter: Payer: Self-pay | Admitting: Adult Health

## 2015-12-10 ENCOUNTER — Ambulatory Visit: Payer: Medicaid Other | Admitting: Obstetrics and Gynecology

## 2015-12-10 VITALS — BP 100/70 | HR 96 | Ht <= 58 in | Wt 174.0 lb

## 2015-12-10 DIAGNOSIS — Z319 Encounter for procreative management, unspecified: Secondary | ICD-10-CM | POA: Insufficient documentation

## 2015-12-10 DIAGNOSIS — N926 Irregular menstruation, unspecified: Secondary | ICD-10-CM | POA: Diagnosis not present

## 2015-12-10 MED ORDER — PRENATAL PLUS 27-1 MG PO TABS: 1.0000 | ORAL_TABLET | Freq: Every day | ORAL | 11 refills | Status: DC

## 2015-12-10 NOTE — Patient Instructions (Signed)
Have sex day 7-24 of cycle every other day Pee before sex and lay there 30 minutes after sex Check progesterone level October 3 Follow up prn

## 2015-12-10 NOTE — Progress Notes (Signed)
Subjective:     Patient ID: Hailey Thompson, female   DOB: 09-May-1995, 20 y.o.   MRN: [id number]  HPI Yasmyn is a 20 year old white female in to discuss her periods, PMP 8/17, LMP 9/13, so they were 28 days apart, but cycles have seemed to have shortened over last several months, she had seen Dr Glo Herring in July, but was confused over what to do next.She has had unprotected sex for over 6 months, and wants a baby. She has a 33 month old little boy.  Review of Systems  Irregular periods  Desires pregnancy  Reviewed past medical,surgical, social and family history. Reviewed medications and allergies.     Objective:   Physical Exam BP 100/70 (BP Location: Left Arm, Patient Position: Sitting, Cuff Size: Normal)   Pulse 96   Ht 4\' 6"  (1.372 m)   Wt 174 lb (78.9 kg)   LMP 12/09/2015 (Exact Date)   Breastfeeding? No   BMI 41.95 kg/m  Skin warm and dry. Neck: mid line trachea, normal thyroid, good ROM, no lymphadenopathy noted. Lungs: clear to ausculation bilaterally. Cardiovascular: regular rate and rhythm.Discussed timing of sex and will check progesterone level to assess ovulation.   She is aware could take 6-18 months of active trying, but if not ovulating could try clomid. Face time 15 minutes with 50% counseling.   Assessment:     1. Patient desires pregnancy    2.   Irregular periods     Plan:     Rx prenatal plus #30 take 1 daily with 11 refills Check progesterone level October 3 Have sex day 7-24 of cycle every other day Pee before sex and lay there 30 minutes after sex Follow up prn

## 2015-12-23 ENCOUNTER — Encounter: Payer: Self-pay | Admitting: Adult Health

## 2015-12-30 ENCOUNTER — Telehealth: Payer: Self-pay | Admitting: Adult Health

## 2015-12-30 LAB — PROGESTERONE: Progesterone: 9.7 ng/mL

## 2015-12-30 NOTE — Telephone Encounter (Signed)
Pt aware progesterone 9.7, which looks like you ovulated

## 2016-01-11 ENCOUNTER — Ambulatory Visit: Payer: Medicaid Other | Admitting: Adult Health

## 2016-03-11 ENCOUNTER — Encounter: Payer: Self-pay | Admitting: Adult Health

## 2016-03-11 ENCOUNTER — Ambulatory Visit (INDEPENDENT_AMBULATORY_CARE_PROVIDER_SITE_OTHER): Payer: Medicaid Other | Admitting: Adult Health

## 2016-03-11 VITALS — BP 122/78 | HR 72 | Ht <= 58 in | Wt 176.0 lb

## 2016-03-11 DIAGNOSIS — F329 Major depressive disorder, single episode, unspecified: Secondary | ICD-10-CM | POA: Diagnosis not present

## 2016-03-11 DIAGNOSIS — F32A Depression, unspecified: Secondary | ICD-10-CM

## 2016-03-11 DIAGNOSIS — R51 Headache: Secondary | ICD-10-CM

## 2016-03-11 DIAGNOSIS — Z3202 Encounter for pregnancy test, result negative: Secondary | ICD-10-CM | POA: Diagnosis not present

## 2016-03-11 DIAGNOSIS — R12 Heartburn: Secondary | ICD-10-CM

## 2016-03-11 DIAGNOSIS — Z319 Encounter for procreative management, unspecified: Secondary | ICD-10-CM

## 2016-03-11 LAB — POCT URINE PREGNANCY: Preg Test, Ur: NEGATIVE

## 2016-03-11 NOTE — Progress Notes (Signed)
Subjective:     Patient ID: Hailey Thompson, female   DOB: 1995/12/25, 20 y.o.   MRN: [id number]  HPI Hailey Thompson is a 20 year old white female in because she is having heartburn, and cravings and headaches every other day(had these symptoms with last pregnancy) and had +HPT 12/3 then bleed lightly 12/4 to 12/6, and she wants to be pregnant. She said it took a while with last pregnancy for test to be show up positive.  Review of Systems +heartburn Headaches every other day +cravings Reviewed past medical,surgical, social and family history. Reviewed medications and allergies.     Objective:   Physical Exam BP 122/78 (BP Location: Left Arm, Patient Position: Sitting, Cuff Size: Normal)   Pulse 72   Ht 4\' 6"  (1.372 m)   Wt 176 lb (79.8 kg)   LMP 02/29/2016   BMI 42.44 kg/m UPT negative, Skin warm and dry.  Lungs: clear to ausculation bilaterally. Cardiovascular: regular rate and rhythm.   PHQ 9 score 8, denies any suicidal ideations but is depressed at times, declines meds.She says it comes and goes with her thyroid. Will check [facility] today, and discuss Monday.  Face time 15 minutes with 50 % talking.  Assessment:     1. Heartburn   2. Patient desires pregnancy   3. Pregnancy examination or test, negative result   4 .     Depression    Plan:     Take TUMS prn Check [facility] Get thyroid labs as scheduled 12/18 from clinic  Follow up prn

## 2016-03-12 LAB — BETA HCG QUANT (REF LAB): hCG Quant: <1 m[IU]/mL

## 2016-03-14 ENCOUNTER — Encounter: Payer: Self-pay | Admitting: Adult Health

## 2016-06-22 LAB — TSH: TSH: 100 u[IU]/mL — AB (ref <=5.90)

## 2016-08-03 ENCOUNTER — Ambulatory Visit (INDEPENDENT_AMBULATORY_CARE_PROVIDER_SITE_OTHER): Payer: Medicaid Other | Admitting: "Endocrinology

## 2016-08-03 ENCOUNTER — Encounter: Payer: Self-pay | Admitting: "Endocrinology

## 2016-08-03 VITALS — BP 113/73 | HR 74 | Ht <= 58 in | Wt 186.0 lb

## 2016-08-03 DIAGNOSIS — E039 Hypothyroidism, unspecified: Secondary | ICD-10-CM | POA: Diagnosis not present

## 2016-08-03 DIAGNOSIS — R6252 Short stature (child): Secondary | ICD-10-CM

## 2016-08-03 DIAGNOSIS — Z6841 Body Mass Index (BMI) 40.0 and over, adult: Secondary | ICD-10-CM

## 2016-08-03 MED ORDER — LEVOTHYROXINE SODIUM 112 MCG PO TABS: 112.0000 ug | ORAL_TABLET | Freq: Every day | ORAL | 2 refills | Status: DC

## 2016-08-03 NOTE — Progress Notes (Signed)
Subjective:    Patient ID: Hailey Thompson, female    DOB: [birth year], PCP Muse, Noel Journey., PA-C   Past Medical History:  Diagnosis Date  . Migraine   . Overactive thyroid gland   . Thyroid disease   . UTI (lower urinary tract infection)    Past Surgical History:  Procedure Laterality Date  . APPENDECTOMY    . CESAREAN SECTION    . CHOLECYSTECTOMY N/A 11/06/2015   Procedure: LAPAROSCOPIC CHOLECYSTECTOMY;  Surgeon: Vickie Epley, MD;  Location: [facility];  Service: General;  Laterality: N/A;   Social History   Social History  . Marital status: Married    Spouse name: N/A  . Number of children: N/A  . Years of education: N/A   Social History Main Topics  . Smoking status: Never Smoker  . Smokeless tobacco: Never Used  . Alcohol use No  . Drug use: No  . Sexual activity: Yes    Birth control/ protection: None   Other Topics Concern  . None   Social History Narrative   Hailey Thompson is in 11 th grade at [employer].  Lives with Hailey Thompson (Cousin/legal guardian)   Outpatient Encounter Prescriptions as of 08/03/2016  Medication Sig  . [DISCONTINUED] levothyroxine (SYNTHROID, LEVOTHROID) 88 MCG tablet Take 88 mcg by mouth daily before breakfast.  . levothyroxine (SYNTHROID, LEVOTHROID) 112 MCG tablet Take 1 tablet (112 mcg total) by mouth daily before breakfast.  . [DISCONTINUED] acetaminophen (TYLENOL) 500 MG tablet Take 500 mg by mouth every 6 (six) hours as needed for headache.  . [DISCONTINUED] levothyroxine (SYNTHROID, LEVOTHROID) 112 MCG tablet Take 112 mcg by mouth daily before breakfast.  . [DISCONTINUED] prenatal vitamin w/FE, FA (PRENATAL 1 + 1) 27-1 MG TABS tablet Take 1 tablet by mouth daily at 12 noon.   No facility-administered encounter medications on file as of 08/03/2016.    ALLERGIES: Allergies  Allergen Reactions  . Mushroom Extract Complex Anaphylaxis  . Chocolate Itching  . Oxycodone Diarrhea  . Prednisone Other (See Comments)    hallucinations   . Versed [Midazolam] Hives    Hives after IV administration of zofran and versed. Unsure which medication caused reaction  . Zofran [Ondansetron Hcl] Hives    Hives after IV adminstration of zofran and versed. Unsure of which medication caused reaction.     VACCINATION STATUS: Immunization History  Administered Date(s) Administered  . Influenza Whole 02/14/2011    HPI 21 year old female patient with medical history as above. She is being seen in consultation for long-standing hypothyroidism requested by [facility]. - She reports that she was diagnosed with hypothyroidism at age 8 years, treated with various doses of levothyroxine over the years. She is currently on 88 g of levothyroxine by mouth every morning. - There has been documented inconsistencies in her intake of his thyroid hormone. - She states that she has been more consistent lately, especially over the last 2 years since when she gave birth to her first child. - She has gained significant amount of weight over the years progressively, gained 12 pounds since September 2017. She reports chronic fatigue. - She has family history of hypothyroidism in her mother. She denies family history of thyroid cancer. She denies any personal history of goiter. - Her most recent labs from 06/22/2016 showed that her TSH was > 150. No associated free T4 with that.  Review of Systems  Constitutional: + weight gain, + fatigue, no subjective hyperthermia, no subjective hypothermia Eyes: no blurry  vision, no xerophthalmia ENT: no sore throat, no nodules palpated in throat, no dysphagia/odynophagia, no hoarseness Cardiovascular: no Chest Pain, no Shortness of Breath, no palpitations, no leg swelling Respiratory: no cough, no SOB Gastrointestinal: no Nausea/Vomiting/Diarhhea Musculoskeletal: + Short stature, no muscle/joint aches Skin: no rashes, hypopigmented patches consistent with vitiligo. Neurological: no tremors, no  numbness, no tingling, no dizziness Psychiatric: no depression, no anxiety  Objective:    BP 113/73   Pulse 74   Ht 4\' 6"  (1.372 m)   Wt 186 lb (84.4 kg)   BMI 44.85 kg/m   Wt Readings from Last 3 Encounters:  08/03/16 186 lb (84.4 kg)  03/11/16 176 lb (79.8 kg)  12/10/15 174 lb (78.9 kg)    Physical Exam  Constitutional: Significantly over weight for hight, not in acute distress, normal state of mind Eyes: PERRLA, EOMI, no exophthalmos ENT: moist mucous membranes, no thyromegaly, no cervical lymphadenopathy Cardiovascular: normal precordial activity, Regular Rate and Rhythm, no Murmur/Rubs/Gallops Respiratory:  adequate breathing efforts, no gross chest deformity, Clear to auscultation bilaterally Gastrointestinal: abdomen soft, Non -tender, No distension, Bowel Sounds present Musculoskeletal: no gross deformities, strength intact in all four extremities Skin: moist, warm, no rashes, +vitiligo Neurological: no tremor with outstretched hands, Deep tendon reflexes normal in all four extremities.  CMP ( most recent) CMP     Component Value Date/Time   NA 134 (L) 10/22/2015 2141   K 3.9 10/22/2015 2141   CL 105 10/22/2015 2141   CO2 25 10/22/2015 2141   GLUCOSE 105 (H) 10/22/2015 2141   BUN 13 10/22/2015 2141   CREATININE 0.78 10/22/2015 2141   CREATININE 0.56 07/23/2013 0815   CALCIUM 8.1 (L) 10/22/2015 2141   CALCIUM 8.9 07/19/2011 1547   PROT 7.6 10/22/2015 2141   ALBUMIN 3.3 (L) 10/22/2015 2141   AST 22 10/22/2015 2141   ALT 19 10/22/2015 2141   ALKPHOS 93 10/22/2015 2141   BILITOT 0.2 (L) 10/22/2015 2141   GFRNONAA >60 10/22/2015 2141   GFRAA >60 10/22/2015 2141     Diabetic Labs (most recent): Lab Results  Component Value Date   HGBA1C 5.1 07/16/2012   HGBA1C 5.4 10/25/2011   HGBA1C 4.9 07/19/2011     Lipid Panel ( most recent) Lipid Panel     Component Value Date/Time   CHOL 156 07/23/2013 0815   TRIG 131 07/23/2013 0815   HDL 42 07/23/2013 0815    CHOLHDL 3.7 07/23/2013 0815   VLDL 26 07/23/2013 0815   LDLCALC 88 07/23/2013 0815   06/22/2016: TSH > 150  Assessment & Plan:   1. Hypothyroidism, unspecified type 2. Class 3 severe obesity due to excess calories with serious comorbidity and body mass index (BMI) of 40.0 to 44.9 in adult [facility]) - I have reviewed her available records and clinically evaluated this patient. She appears to have long-standing hypothyroidism since age 56 years. - Based on her most recent labs showing TSH of >150 , she will require significantly higher dose of levothyroxine. - I will increase her levothyroxine to 112 g by mouth every morning.  - We discussed about correct intake of levothyroxine, at fasting, with water, separated by at least 30 minutes from breakfast, and separated by more than 4 hours from calcium, iron, multivitamins, acid reflux medications (PPIs). -Patient is made aware of the fact that thyroid hormone replacement is needed for life, dose to be adjusted by periodic monitoring of thyroid function tests.  -Given her obesity, I will add A1c to screen for diabetes along  with her next thyroid function test in 7 weeks. I have given her individualized carbs/exercise regimen. - I advised patient to maintain close follow up with Raiford Simmonds., PA-C for primary care needs. Follow up plan: Return in about 8 weeks (around 09/28/2016) for follow up with pre-visit labs.  Glade Lloyd, MD Phone: [phone]  Fax: [phone]   08/03/2016, 3:23 PM

## 2016-10-06 ENCOUNTER — Ambulatory Visit: Payer: Medicaid Other | Admitting: "Endocrinology

## 2016-11-01 ENCOUNTER — Other Ambulatory Visit: Payer: Self-pay | Admitting: "Endocrinology

## 2016-11-08 ENCOUNTER — Ambulatory Visit: Payer: Medicaid Other | Admitting: "Endocrinology

## 2016-11-14 ENCOUNTER — Ambulatory Visit: Payer: Medicaid Other | Admitting: Adult Health

## 2016-11-24 ENCOUNTER — Ambulatory Visit: Payer: Medicaid Other | Admitting: "Endocrinology

## 2016-12-07 ENCOUNTER — Ambulatory Visit: Payer: Medicaid Other | Admitting: "Endocrinology

## 2017-02-06 LAB — TSH: TSH: 0.13 mIU/L — ABNORMAL LOW

## 2017-02-06 LAB — THYROGLOBULIN ANTIBODY: Thyroglobulin Ab: 2 IU/mL — ABNORMAL HIGH (ref <=1)

## 2017-02-06 LAB — HEMOGLOBIN A1C
HEMOGLOBIN A1C: 5 %{Hb} (ref <5.7)
Mean Plasma Glucose: 97 (calc)
eAG (mmol/L): 5.4 (calc)

## 2017-02-06 LAB — T4, FREE: FREE T4: 1.8 ng/dL (ref 0.8–1.8)

## 2017-02-06 LAB — THYROID PEROXIDASE ANTIBODY: Thyroperoxidase Ab SerPl-aCnc: <1 IU/mL (ref <9)

## 2017-02-10 ENCOUNTER — Encounter: Payer: Self-pay | Admitting: "Endocrinology

## 2017-02-10 ENCOUNTER — Ambulatory Visit (INDEPENDENT_AMBULATORY_CARE_PROVIDER_SITE_OTHER): Payer: Self-pay | Admitting: "Endocrinology

## 2017-02-10 VITALS — BP 119/81 | HR 103 | Ht <= 58 in | Wt 179.0 lb

## 2017-02-10 DIAGNOSIS — E039 Hypothyroidism, unspecified: Secondary | ICD-10-CM

## 2017-02-10 DIAGNOSIS — Z6841 Body Mass Index (BMI) 40.0 and over, adult: Secondary | ICD-10-CM

## 2017-02-10 MED ORDER — LEVOTHYROXINE SODIUM 112 MCG PO TABS: ORAL_TABLET | ORAL | 6 refills | Status: DC

## 2017-02-10 NOTE — Patient Instructions (Signed)

## 2017-02-10 NOTE — Progress Notes (Signed)
Subjective:    Patient ID: Hailey Thompson, female    DOB: July 19, 1995, PCP Muse, Noel Journey., PA-C   Past Medical History:  Diagnosis Date  . Migraine   . Overactive thyroid gland   . Thyroid disease   . UTI (lower urinary tract infection)    Past Surgical History:  Procedure Laterality Date  . APPENDECTOMY    . CESAREAN SECTION    . LAPAROSCOPIC CHOLECYSTECTOMY N/A 11/06/2015   Performed by Vickie Epley, MD at [facility]   Social History   Socioeconomic History  . Marital status: Married    Spouse name: None  . Number of children: None  . Years of education: None  . Highest education level: None  Social Needs  . Financial resource strain: None  . Food insecurity - worry: None  . Food insecurity - inability: None  . Transportation needs - medical: None  . Transportation needs - non-medical: None  Occupational History  . None  Tobacco Use  . Smoking status: Never Smoker  . Smokeless tobacco: Never Used  Substance and Sexual Activity  . Alcohol use: No  . Drug use: No  . Sexual activity: Yes    Birth control/protection: None  Other Topics Concern  . None  Social History Narrative   Hailey Thompson is in 11 th grade at [employer].  Lives with Benita Stabile (Cousin/legal guardian)   Outpatient Encounter Medications as of 02/10/2017  Medication Sig  . levothyroxine (SYNTHROID, LEVOTHROID) 112 MCG tablet take 1 tablet by mouth BEFORE BREAKFAST  . [DISCONTINUED] levothyroxine (SYNTHROID, LEVOTHROID) 112 MCG tablet take 1 tablet by mouth BEFORE BREAKFAST   No facility-administered encounter medications on file as of 02/10/2017.    ALLERGIES: Allergies  Allergen Reactions  . Mushroom Extract Complex Anaphylaxis  . Chocolate Itching  . Oxycodone Diarrhea  . Prednisone Other (See Comments)    hallucinations  . Versed [Midazolam] Hives    Hives after IV administration of zofran and versed. Unsure which medication caused reaction  . Zofran [Ondansetron Hcl] Hives    Hives after IV adminstration of zofran and versed. Unsure of which medication caused reaction.     VACCINATION STATUS: Immunization History  Administered Date(s) Administered  . Influenza Whole 02/14/2011    HPI 21 year old female patient with medical history as above. She is being seen in f/u for long-standing hypothyroidism. - She reports that she was diagnosed with hypothyroidism at age 89 years, treated with various doses of levothyroxine over the years. She is currently on 112 g of levothyroxine by mouth every morning. - There has been documented inconsistencies in her intake of his thyroid hormone prior to her last visit. - She began to take her medication more consistently lately. - She lost 7 pounds since last visit. - She has family history of hypothyroidism in her mother. She denies family history of thyroid cancer. She denies any personal history of goiter.   Review of Systems  Constitutional: + weight loss, + fatigue, no subjective hyperthermia, no subjective hypothermia Eyes: no blurry vision, no xerophthalmia ENT: no sore throat, no nodules palpated in throat, no dysphagia/odynophagia, no hoarseness Cardiovascular: no Chest Pain, no Shortness of Breath, no palpitations, no leg swelling Respiratory: no cough, no SOB Gastrointestinal: no Nausea/Vomiting/Diarhhea Musculoskeletal: + Short stature, no muscle/joint aches Skin: no rashes, hypopigmented patches consistent with vitiligo. Neurological: no tremors, no numbness, no tingling, no dizziness Psychiatric: no depression, no anxiety  Objective:    BP 119/81   Pulse (!) 103  Ht 4\' 6"  (1.372 m)   Wt 179 lb (81.2 kg)   BMI 43.16 kg/m   Wt Readings from Last 3 Encounters:  02/10/17 179 lb (81.2 kg)  08/03/16 186 lb (84.4 kg)  03/11/16 176 lb (79.8 kg)    Physical Exam  Constitutional: Significantly over weight for hight, not in acute distress, normal state of mind Eyes: PERRLA, EOMI, no exophthalmos ENT:  moist mucous membranes, no thyromegaly, no cervical lymphadenopathy Cardiovascular: normal precordial activity, Regular Rate and Rhythm, no Murmur/Rubs/Gallops Respiratory:  adequate breathing efforts, no gross chest deformity, Clear to auscultation bilaterally Gastrointestinal: abdomen soft, Non -tender, No distension, Bowel Sounds present Musculoskeletal: no gross deformities, strength intact in all four extremities Skin: moist, warm, no rashes, +vitiligo Neurological: no tremor with outstretched hands, Deep tendon reflexes normal in all four extremities.  CMP ( most recent) CMP     Component Value Date/Time   NA 134 (L) 10/22/2015 2141   K 3.9 10/22/2015 2141   CL 105 10/22/2015 2141   CO2 25 10/22/2015 2141   GLUCOSE 105 (H) 10/22/2015 2141   BUN 13 10/22/2015 2141   CREATININE 0.78 10/22/2015 2141   CREATININE 0.56 07/23/2013 0815   CALCIUM 8.1 (L) 10/22/2015 2141   CALCIUM 8.9 07/19/2011 1547   PROT 7.6 10/22/2015 2141   ALBUMIN 3.3 (L) 10/22/2015 2141   AST 22 10/22/2015 2141   ALT 19 10/22/2015 2141   ALKPHOS 93 10/22/2015 2141   BILITOT 0.2 (L) 10/22/2015 2141   GFRNONAA >60 10/22/2015 2141   GFRAA >60 10/22/2015 2141     Diabetic Labs (most recent): Lab Results  Component Value Date   HGBA1C 5.0 02/04/2017   HGBA1C 5.1 07/16/2012   HGBA1C 5.4 10/25/2011     Lipid Panel ( most recent) Lipid Panel     Component Value Date/Time   CHOL 156 07/23/2013 0815   TRIG 131 07/23/2013 0815   HDL 42 07/23/2013 0815   CHOLHDL 3.7 07/23/2013 0815   VLDL 26 07/23/2013 0815   LDLCALC 88 07/23/2013 0815   Results for Hailey, Thompson (MRN [id number]) as of 02/10/2017 10:00  Ref. Range 06/22/2016 00:00 02/04/2017 09:53  TSH Latest Units: mIU/L 100.00 (A) 0.13 (L)  T4,Free(Direct) Latest Ref Range: 0.8 - 1.8 ng/dL  1.8  Thyroglobulin Ab Latest Ref Range: < or = 1 IU/mL  2 (H)  Thyroperoxidase Ab SerPl-aCnc Latest Ref Range: <9 IU/mL  <1    Assessment & Plan:   1.  Hypothyroidism due to Hashimoto's thyroiditis 2. Class 3 severe obesity due to excess calories with serious comorbidity and body mass index (BMI) of 40.0 to 44.9 in adult [facility]) - I have reviewed her available records and clinically evaluated this patient. She appears to have long-standing hypothyroidism since age 64 years. - Based on her most recent labs , her thyroid function tests are consistent with appropriate replacement. - I will continue her levothyroxine to 112 g by mouth every morning.  - We discussed about correct intake of levothyroxine, at fasting, with water, separated by at least 30 minutes from breakfast, and separated by more than 4 hours from calcium, iron, multivitamins, acid reflux medications (PPIs). -Patient is made aware of the fact that thyroid hormone replacement is needed for life, dose to be adjusted by periodic monitoring of thyroid function tests.  -Given her obesity, A1c was measured indicates 5% indicating absence of prediabetes/diabetes. - She will benefit from more weight loss.  -  Suggestion is made for her to avoid simple carbohydrates  from her diet including Cakes, Sweet Desserts / Pastries, Ice Cream, Soda (diet and regular), Sweet Tea, Candies, Chips, Cookies, Store Bought Juices, Alcohol in Excess of  1-2 drinks a day, Artificial Sweeteners, and "Sugar-free" Products. This will help patient to have stable blood glucose profile and potentially avoid unintended weight gain.  - I advised patient to maintain close follow up with Raiford Simmonds., PA-C for primary care needs. Follow up plan: Return in about 6 months (around 08/10/2017) for follow up with pre-visit labs.  Glade Lloyd, MD Phone: [phone]  Fax: [phone]  -  This note was partially dictated with voice recognition software. Similar sounding words can be transcribed inadequately or may not  be corrected upon review.  02/10/2017, 9:57 AM

## 2017-02-22 ENCOUNTER — Ambulatory Visit: Payer: Self-pay | Admitting: "Endocrinology

## 2017-07-12 IMAGING — CT CT ABD-PELV W/ CM
2 of 4 series · 16 of 46 positions shown, 18 images · IV contrast (iopamidol)
Comparison: None.

CLINICAL DATA: 20-year-old female mid upper abdominal pain
radiating to the back

EXAM:
CT ABDOMEN AND PELVIS WITH CONTRAST
TECHNIQUE: Multidetector CT imaging of the abdomen and pelvis was performed
using the standard protocol following bolus administration of
intravenous contrast.
CONTRAST:  100mL [id number] IOPAMIDOL ([id number]) INJECTION 61%

[Series 2: routine abd pel with · axial · 0.75mm/px · z∈[-422,-17]mm · 13 of 89 slices shown, 15 images]
[im 4/89  soft-tissue]
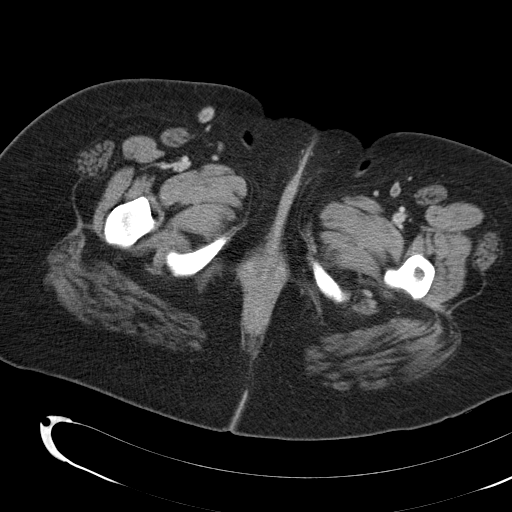
[im 4/89  bone]
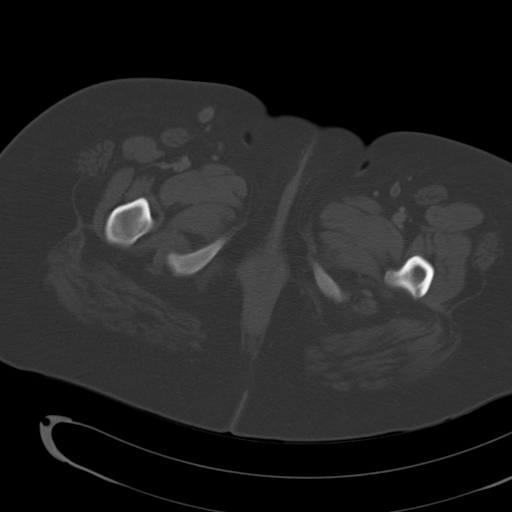
[im 12/89  soft-tissue]
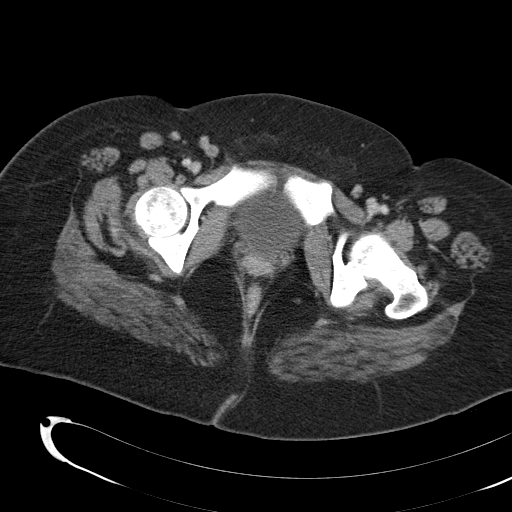
[im 19/89  soft-tissue]
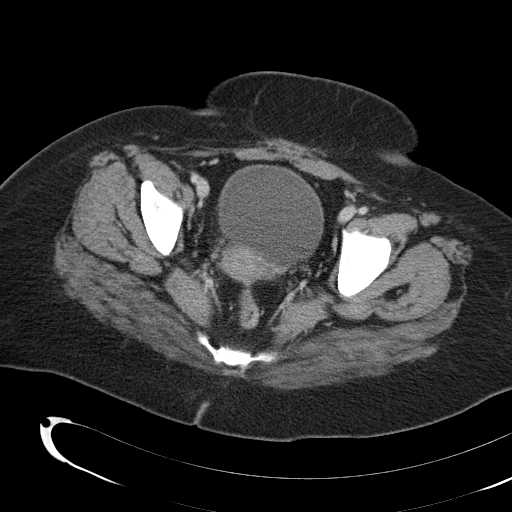
[im 26/89  soft-tissue]
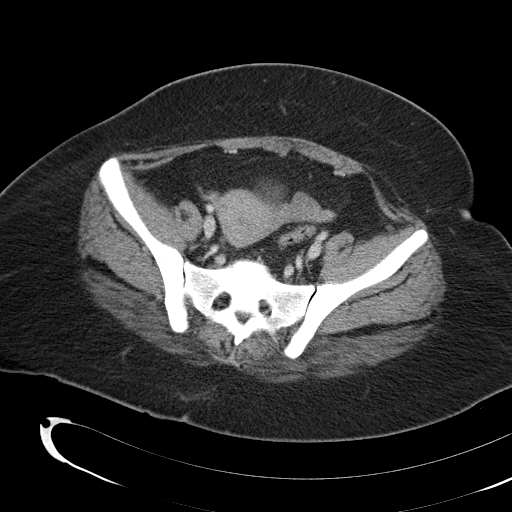
[im 30/89  soft-tissue]
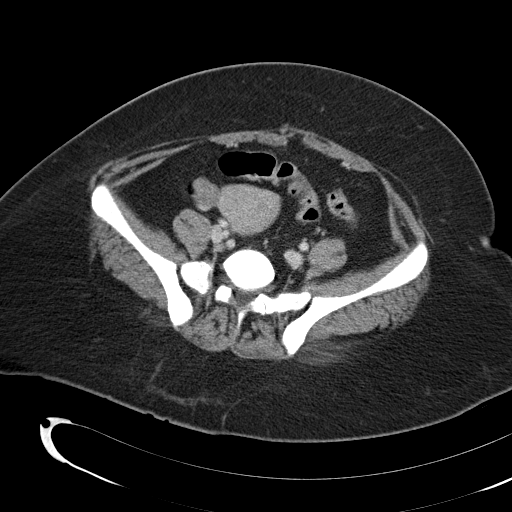
[im 37/89  soft-tissue]
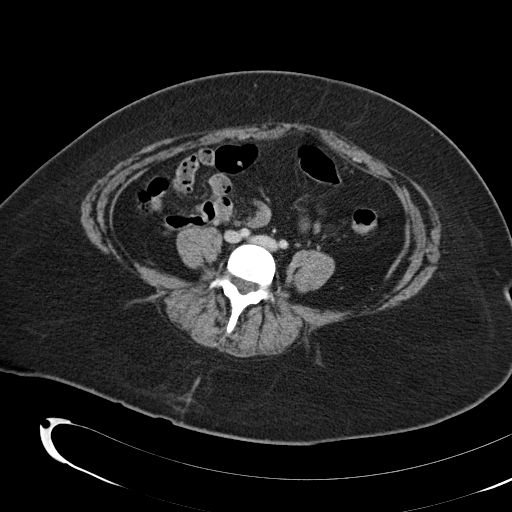
[im 45/89  soft-tissue]
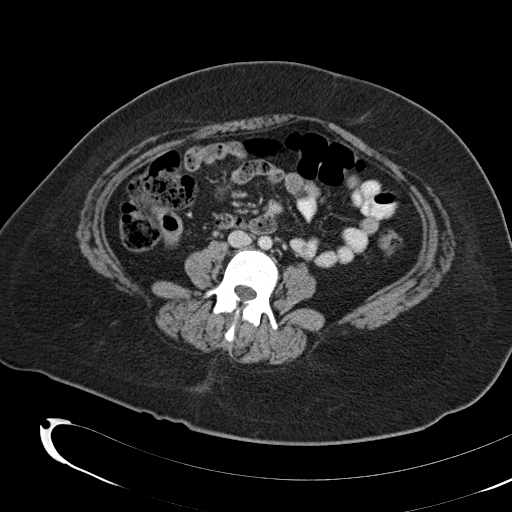
[im 52/89  soft-tissue]
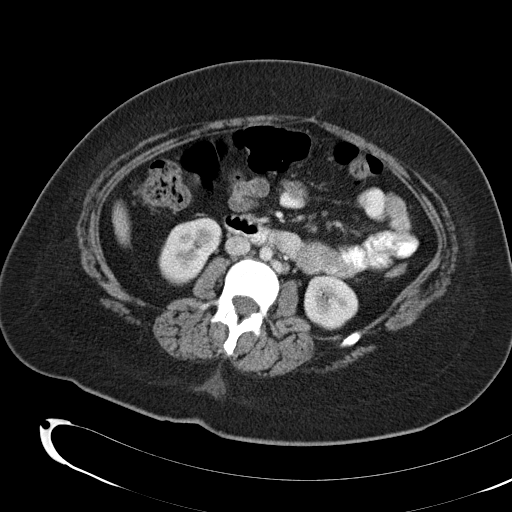
[im 59/89  soft-tissue]
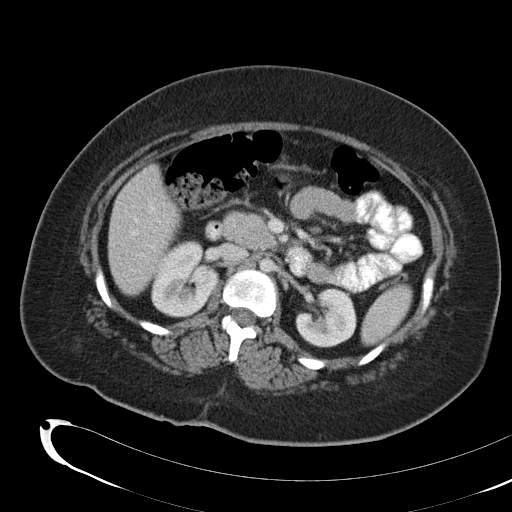
[im 59/89  bone]
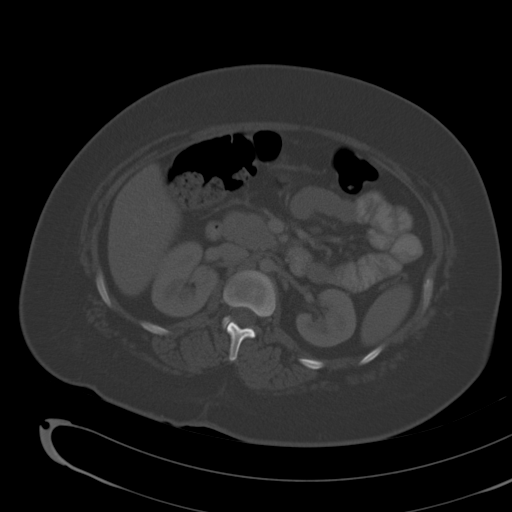
[im 63/89  soft-tissue]
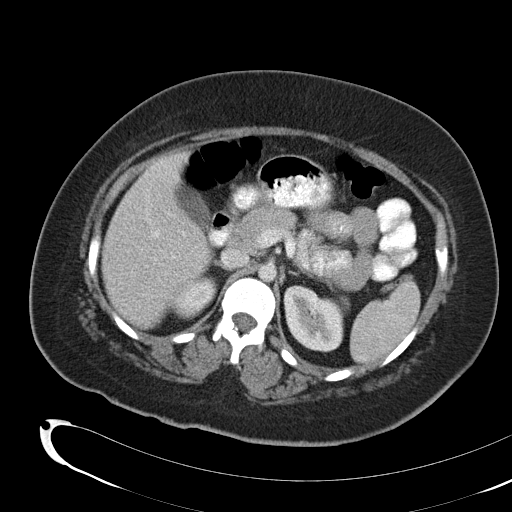
[im 70/89  soft-tissue]
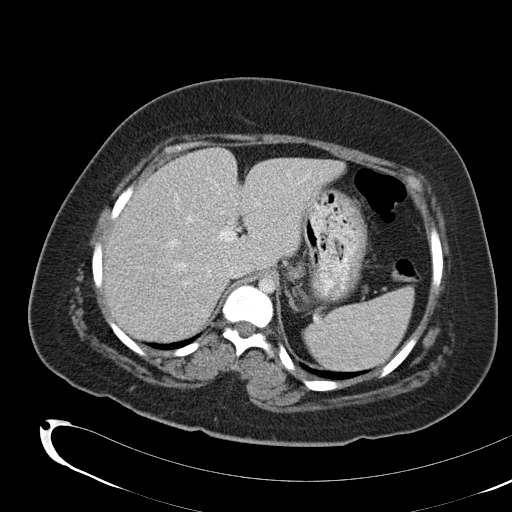
[im 78/89  soft-tissue]
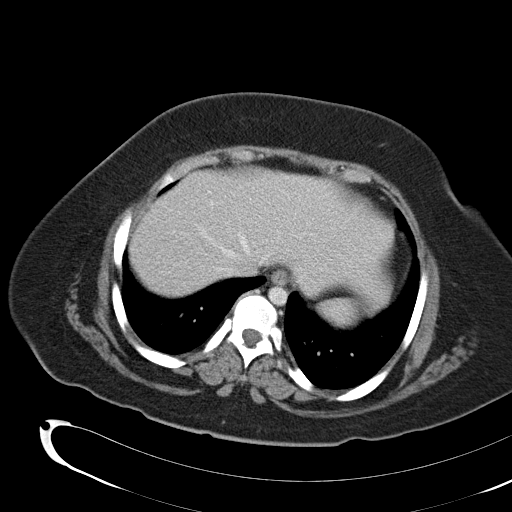
[im 85/89  soft-tissue]
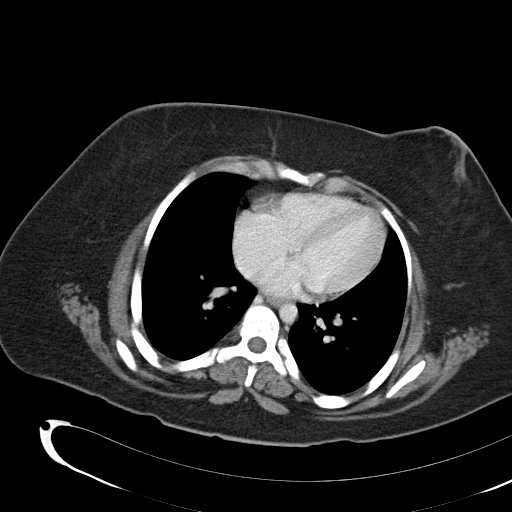

[Series 3: coronal · coronal · 0.66mm/px · 3 of 108 slices shown]
[im 36/108  soft-tissue]
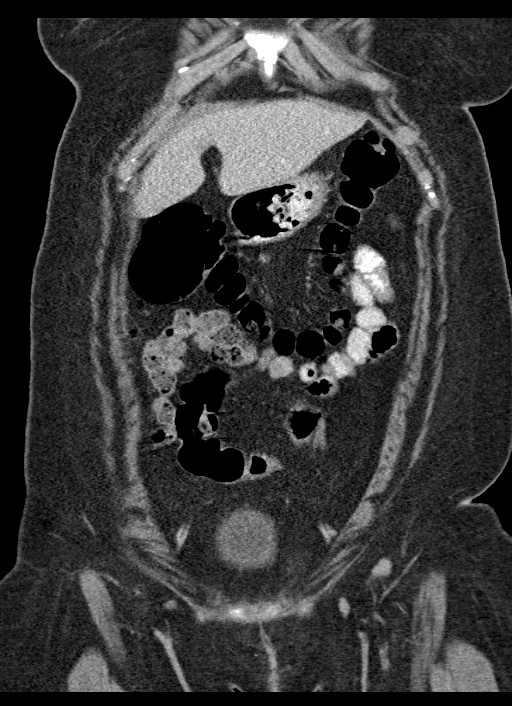
[im 48/108  soft-tissue]
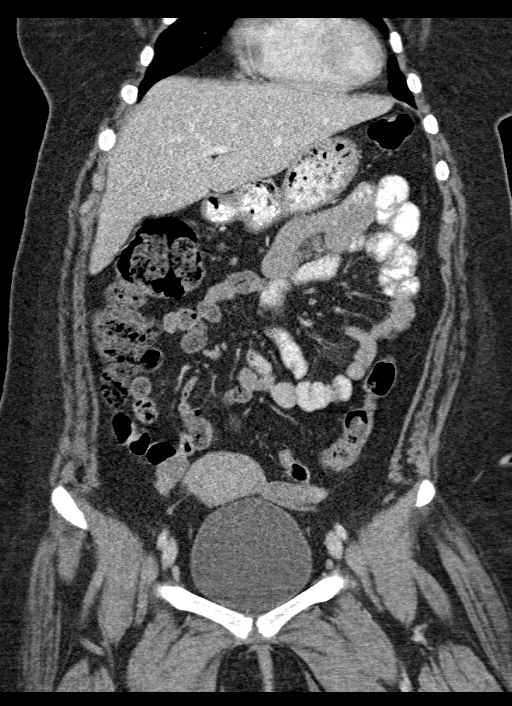
[im 60/108  soft-tissue]
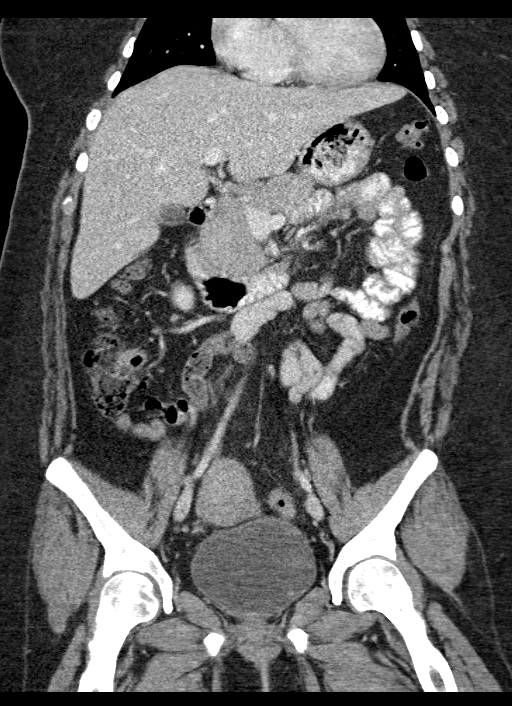

[16 of 46 positions shown; findings below may reference images not displayed]

FINDINGS: The visualized lung bases are clear. No intra-abdominal free air or
free fluid.

The gallbladder is contracted. The liver, pancreas, spleen, adrenal
glands, kidneys, visualized ureters, and urinary bladder appear
unremarkable. The uterus is anteverted and grossly unremarkable. The
ovaries appear unremarkable as well.

There is no evidence of bowel obstruction or active inflammation.
Appendectomy.

The abdominal aorta and IVC appear unremarkable. No portal venous
gas identified. There is no adenopathy. There is a small fat
containing umbilical hernia. The abdominal wall soft tissues are
otherwise unremarkable. The osseous structures are intact.
IMPRESSION: No acute intra-abdominal pelvic pathology.

## 2017-08-10 ENCOUNTER — Ambulatory Visit: Payer: Self-pay | Admitting: "Endocrinology

## 2017-09-10 ENCOUNTER — Other Ambulatory Visit: Payer: Self-pay | Admitting: "Endocrinology

## 2017-10-11 ENCOUNTER — Other Ambulatory Visit (HOSPITAL_COMMUNITY)
Admission: RE | Admit: 2017-10-11 | Discharge: 2017-10-11 | Disposition: A | Discharge status: Home / Self Care | Payer: Medicaid Other | Source: Ambulatory Visit | Attending: "Endocrinology | Admitting: "Endocrinology

## 2017-10-11 DIAGNOSIS — E039 Hypothyroidism, unspecified: Secondary | ICD-10-CM | POA: Diagnosis present

## 2017-10-11 LAB — T4, FREE: FREE T4: 1.36 ng/dL (ref 0.82–1.77)

## 2017-10-11 LAB — TSH: TSH: 0.124 u[IU]/mL — AB (ref 0.350–4.500)

## 2017-10-18 ENCOUNTER — Ambulatory Visit: Payer: Self-pay | Admitting: "Endocrinology

## 2017-10-18 ENCOUNTER — Ambulatory Visit (INDEPENDENT_AMBULATORY_CARE_PROVIDER_SITE_OTHER): Payer: Self-pay | Admitting: "Endocrinology

## 2017-10-18 ENCOUNTER — Encounter: Payer: Self-pay | Admitting: "Endocrinology

## 2017-10-18 VITALS — BP 108/72 | HR 102 | Ht <= 58 in | Wt 172.0 lb

## 2017-10-18 DIAGNOSIS — E039 Hypothyroidism, unspecified: Secondary | ICD-10-CM

## 2017-10-18 MED ORDER — LEVOTHYROXINE SODIUM 100 MCG PO TABS: ORAL_TABLET | ORAL | 4 refills | Status: DC

## 2017-10-18 NOTE — Patient Instructions (Signed)

## 2017-10-18 NOTE — Progress Notes (Signed)
Endocrinology follow-up note  Subjective:    Patient ID: Hailey Thompson, female    DOB: 12-31-1995, PCP Muse, Noel Journey., PA-C   Past Medical History:  Diagnosis Date  . Migraine   . Overactive thyroid gland   . Thyroid disease   . UTI (lower urinary tract infection)    Past Surgical History:  Procedure Laterality Date  . APPENDECTOMY    . CESAREAN SECTION    . CHOLECYSTECTOMY N/A 11/06/2015   Procedure: LAPAROSCOPIC CHOLECYSTECTOMY;  Surgeon: Vickie Epley, MD;  Location: [facility];  Service: General;  Laterality: N/A;   Social History   Socioeconomic History  . Marital status: Married    Spouse name: Not on file  . Number of children: Not on file  . Years of education: Not on file  . Highest education level: Not on file  Occupational History  . Not on file  Social Needs  . Financial resource strain: Not on file  . Food insecurity:    Worry: Not on file    Inability: Not on file  . Transportation needs:    Medical: Not on file    Non-medical: Not on file  Tobacco Use  . Smoking status: Never Smoker  . Smokeless tobacco: Never Used  Substance and Sexual Activity  . Alcohol use: No  . Drug use: No  . Sexual activity: Yes    Birth control/protection: None  Lifestyle  . Physical activity:    Days per week: Not on file    Minutes per session: Not on file  . Stress: Not on file  Relationships  . Social connections:    Talks on phone: Not on file    Gets together: Not on file    Attends religious service: Not on file    Active member of club or organization: Not on file    Attends meetings of clubs or organizations: Not on file    Relationship status: Not on file  Other Topics Concern  . Not on file  Social History Narrative   Hailey Thompson is in 44 th grade at [employer].  Lives with Benita Stabile (Cousin/legal guardian)   Outpatient Encounter Medications as of 10/18/2017  Medication Sig  . levothyroxine (SYNTHROID, LEVOTHROID) 100 MCG tablet TAKE 1 TABLET  BY MOUTH ONCE DAILY BEFORE BREAKFAST  . [DISCONTINUED] famotidine (PEPCID) 20 MG tablet Take 1 tablet (20 mg total) by mouth 2 (two) times daily.  . [DISCONTINUED] levothyroxine (SYNTHROID, LEVOTHROID) 112 MCG tablet TAKE 1 TABLET BY MOUTH ONCE DAILY BEFORE BREAKFAST   No facility-administered encounter medications on file as of 10/18/2017.    ALLERGIES: Allergies  Allergen Reactions  . Mushroom Extract Complex Anaphylaxis  . Chocolate Itching  . Oxycodone Diarrhea  . Prednisone Other (See Comments)    hallucinations  . Versed [Midazolam] Hives    Hives after IV administration of zofran and versed. Unsure which medication caused reaction  . Zofran [Ondansetron Hcl] Hives    Hives after IV adminstration of zofran and versed. Unsure of which medication caused reaction.     VACCINATION STATUS: Immunization History  Administered Date(s) Administered  . Influenza Whole 02/14/2011    HPI 22 year old female patient with medical history as above.  She is being seen in follow-up for hypothyroidism.  She is on levothyroxine 112 mcg p.o. every morning.   - She reports that she was diagnosed with hypothyroidism at age 24 years, treated with various doses of levothyroxine over the years. - There has been documented inconsistencies in her  intake of his thyroid hormone prior to her last visit. - She began to take her medication more consistently lately. - She lost 14 pounds since May 2019. . - She has family history of hypothyroidism in her mother. She denies family history of thyroid cancer. She denies any personal history of goiter.   Review of Systems  Constitutional: + weight loss, + fatigue, no subjective hyperthermia, no subjective hypothermia Eyes: no blurry vision, no xerophthalmia ENT: no sore throat, no nodules palpated in throat, no dysphagia/odynophagia, no hoarseness Cardiovascular: no Chest Pain, no Shortness of Breath, + palpitations, no leg swelling Respiratory: no cough, no  SOB Gastrointestinal: no Nausea/Vomiting/Diarhhea Musculoskeletal: + Short stature, no muscle/joint aches Skin: no rashes, hypopigmented patches consistent with vitiligo. Neurological: + tremors, no numbness, no tingling, no dizziness Psychiatric: no depression, + anxiety  Objective:    BP 108/72   Pulse (!) 102   Ht 4\' 6"  (1.372 m)   Wt 172 lb (78 kg)   BMI 41.47 kg/m   Wt Readings from Last 3 Encounters:  10/18/17 172 lb (78 kg)  02/10/17 179 lb (81.2 kg)  08/03/16 186 lb (84.4 kg)    Physical Exam  Constitutional: Significantly over weight for hight, not in acute distress, normal state of mind Eyes: PERRLA, EOMI, no exophthalmos ENT: moist mucous membranes, no thyromegaly, no cervical lymphadenopathy Cardiovascular: normal precordial activity, + mildly tachycardic at 102, no Murmur/Rubs/Gallops Respiratory:  adequate breathing efforts, no gross chest deformity, Clear to auscultation bilaterally Gastrointestinal: abdomen soft, Non -tender, No distension, Bowel Sounds present Musculoskeletal: no gross deformities, strength intact in all four extremities Skin: moist, warm, no rashes, +vitiligo Neurological: no tremor with outstretched hands   CMP ( most recent) CMP     Component Value Date/Time   NA 134 (L) 10/22/2015 2141   K 3.9 10/22/2015 2141   CL 105 10/22/2015 2141   CO2 25 10/22/2015 2141   GLUCOSE 105 (H) 10/22/2015 2141   BUN 13 10/22/2015 2141   CREATININE 0.78 10/22/2015 2141   CREATININE 0.56 07/23/2013 0815   CALCIUM 8.1 (L) 10/22/2015 2141   CALCIUM 8.9 07/19/2011 1547   PROT 7.6 10/22/2015 2141   ALBUMIN 3.3 (L) 10/22/2015 2141   AST 22 10/22/2015 2141   ALT 19 10/22/2015 2141   ALKPHOS 93 10/22/2015 2141   BILITOT 0.2 (L) 10/22/2015 2141   GFRNONAA >60 10/22/2015 2141   GFRAA >60 10/22/2015 2141     Diabetic Labs (most recent): Lab Results  Component Value Date   HGBA1C 5.0 02/04/2017   HGBA1C 5.1 07/16/2012   HGBA1C 5.4 10/25/2011      Lipid Panel ( most recent) Lipid Panel     Component Value Date/Time   CHOL 156 07/23/2013 0815   TRIG 131 07/23/2013 0815   HDL 42 07/23/2013 0815   CHOLHDL 3.7 07/23/2013 0815   VLDL 26 07/23/2013 0815   LDLCALC 88 07/23/2013 0815   Results for Hailey Thompson, Hailey Thompson (MRN [id number]) as of 10/18/2017 17:32  Ref. Range 02/04/2017 09:53 10/11/2017 15:29  TSH Latest Ref Range: 0.350 - 4.500 uIU/mL 0.13 (L) 0.124 (L)  T4,Free(Direct) Latest Ref Range: 0.82 - 1.77 ng/dL 1.8 1.36  Thyroglobulin Ab Latest Ref Range: < or = 1 IU/mL 2 (H)   Thyroperoxidase Ab SerPl-aCnc Latest Ref Range: <9 IU/mL <1      Assessment & Plan:   1. Hypothyroidism due to Hashimoto's thyroiditis 2. Class 3 severe obesity due to excess calories with serious comorbidity and body mass index (BMI) of  40.0 to 44.9 in adult [facility]) -Review of her medical records indicate that she  has long-standing hypothyroidism since age 47 years. -Her previsit labs indicate slight over replacement with thyroid hormone.  -I discussed and decrease her levothyroxine to 100 mcg p.o. every morning.    - We discussed about correct intake of levothyroxine, at fasting, with water, separated by at least 30 minutes from breakfast, and separated by more than 4 hours from calcium, iron, multivitamins, acid reflux medications (PPIs). -Patient is made aware of the fact that thyroid hormone replacement is needed for life, dose to be adjusted by periodic monitoring of thyroid function tests.   -Given her obesity, A1c was measured indicates 5% indicating absence of prediabetes/diabetes. - She has lost 14 pounds since May 2019, will benefit from more weight loss. -  Suggestion is made for her to avoid simple carbohydrates  from her diet including Cakes, Sweet Desserts / Pastries, Ice Cream, Soda (diet and regular), Sweet Tea, Candies, Chips, Cookies, Store Bought Juices, Alcohol in Excess of  1-2 drinks a day, Artificial Sweeteners, and "Sugar-free" Products. This  will help patient to have stable blood glucose profile and potentially avoid unintended weight gain.   - I advised patient to maintain close follow up with Raiford Simmonds., PA-C for primary care needs.  Follow up plan: Return in about 3 months (around 01/18/2018) for follow up with pre-visit labs.  Glade Lloyd, MD Phone: [phone]  Fax: [phone]  -  This note was partially dictated with voice recognition software. Similar sounding words can be transcribed inadequately or may not  be corrected upon review.  10/18/2017, 5:29 PM

## 2018-01-15 ENCOUNTER — Other Ambulatory Visit (HOSPITAL_COMMUNITY)
Admission: RE | Admit: 2018-01-15 | Discharge: 2018-01-15 | Disposition: A | Discharge status: Home / Self Care | Payer: Medicaid Other | Source: Ambulatory Visit | Attending: "Endocrinology | Admitting: "Endocrinology

## 2018-01-15 DIAGNOSIS — E039 Hypothyroidism, unspecified: Secondary | ICD-10-CM | POA: Insufficient documentation

## 2018-01-15 LAB — T4, FREE: Free T4: 1.28 ng/dL (ref 0.82–1.77)

## 2018-01-15 LAB — TSH: TSH: 1.276 u[IU]/mL (ref 0.350–4.500)

## 2018-01-24 ENCOUNTER — Encounter: Payer: Self-pay | Admitting: "Endocrinology

## 2018-01-24 ENCOUNTER — Ambulatory Visit (INDEPENDENT_AMBULATORY_CARE_PROVIDER_SITE_OTHER): Payer: Medicaid Other | Admitting: "Endocrinology

## 2018-01-24 VITALS — BP 105/72 | HR 102 | Ht <= 58 in | Wt 172.0 lb

## 2018-01-24 DIAGNOSIS — E038 Other specified hypothyroidism: Secondary | ICD-10-CM

## 2018-01-24 DIAGNOSIS — E063 Autoimmune thyroiditis: Secondary | ICD-10-CM

## 2018-01-24 DIAGNOSIS — R5383 Other fatigue: Secondary | ICD-10-CM

## 2018-01-24 MED ORDER — LEVOTHYROXINE SODIUM 100 MCG PO TABS: ORAL_TABLET | ORAL | 4 refills | Status: DC

## 2018-01-24 MED ORDER — LEVOTHYROXINE SODIUM 100 MCG PO TABS: ORAL_TABLET | ORAL | 3 refills | Status: DC

## 2018-01-24 NOTE — Progress Notes (Signed)
Endocrinology follow-up note  Subjective:    Patient ID: Hailey Thompson, female    DOB: [birth year], PCP Muse, Noel Journey., PA-C   Past Medical History:  Diagnosis Date  . Migraine   . Overactive thyroid gland   . Thyroid disease   . UTI (lower urinary tract infection)    Past Surgical History:  Procedure Laterality Date  . APPENDECTOMY    . CESAREAN SECTION    . CHOLECYSTECTOMY N/A 11/06/2015   Procedure: LAPAROSCOPIC CHOLECYSTECTOMY;  Surgeon: Vickie Epley, MD;  Location: [facility];  Service: General;  Laterality: N/A;   Social History   Socioeconomic History  . Marital status: Married    Spouse name: Not on file  . Number of children: Not on file  . Years of education: Not on file  . Highest education level: Not on file  Occupational History  . Not on file  Social Needs  . Financial resource strain: Not on file  . Food insecurity:    Worry: Not on file    Inability: Not on file  . Transportation needs:    Medical: Not on file    Non-medical: Not on file  Tobacco Use  . Smoking status: Never Smoker  . Smokeless tobacco: Never Used  Substance and Sexual Activity  . Alcohol use: No  . Drug use: No  . Sexual activity: Yes    Birth control/protection: None  Lifestyle  . Physical activity:    Days per week: Not on file    Minutes per session: Not on file  . Stress: Not on file  Relationships  . Social connections:    Talks on phone: Not on file    Gets together: Not on file    Attends religious service: Not on file    Active member of club or organization: Not on file    Attends meetings of clubs or organizations: Not on file    Relationship status: Not on file  Other Topics Concern  . Not on file  Social History Narrative   Hailey Thompson is in 82 th grade at [employer].  Lives with Hailey Thompson (Cousin/legal guardian)   Outpatient Encounter Medications as of 01/24/2018  Medication Sig  . levothyroxine (SYNTHROID, LEVOTHROID) 100 MCG tablet TAKE 1 TABLET  BY MOUTH ONCE DAILY BEFORE BREAKFAST  . [DISCONTINUED] famotidine (PEPCID) 20 MG tablet Take 1 tablet (20 mg total) by mouth 2 (two) times daily.  . [DISCONTINUED] levothyroxine (SYNTHROID, LEVOTHROID) 100 MCG tablet TAKE 1 TABLET BY MOUTH ONCE DAILY BEFORE BREAKFAST  . [DISCONTINUED] levothyroxine (SYNTHROID, LEVOTHROID) 100 MCG tablet TAKE 1 TABLET BY MOUTH ONCE DAILY BEFORE BREAKFAST   No facility-administered encounter medications on file as of 01/24/2018.    ALLERGIES: Allergies  Allergen Reactions  . Mushroom Extract Complex Anaphylaxis  . Chocolate Itching  . Oxycodone Diarrhea  . Prednisone Other (See Comments)    hallucinations  . Versed [Midazolam] Hives    Hives after IV administration of zofran and versed. Unsure which medication caused reaction  . Zofran [Ondansetron Hcl] Hives    Hives after IV adminstration of zofran and versed. Unsure of which medication caused reaction.     VACCINATION STATUS: Immunization History  Administered Date(s) Administered  . Influenza Whole 02/14/2011    HPI 22 year old female patient with medical history as above.  She is being seen in follow-up for hypothyroidism.  She is on levothyroxine 100 mcg p.o. every morning.  She reports with better compliance with this medication at this time. - She  reports that she was diagnosed with hypothyroidism at age 22 years, treated with various doses of levothyroxine over the years. - There has been documented inconsistencies in her intake of his thyroid hormone in the past for various reasons including lapses in her insurance coverage.    - She began to take her medication more consistently lately. - She has kept off the 14 pounds she lost prior to her last visit.   -He still complains of fatigue.  Her husband corroborates history of snoring regularly at night. - She has family history of hypothyroidism in her mother. She denies family history of thyroid cancer. She denies any personal history of  goiter.   Review of Systems  Constitutional: + Steady weight, + fatigue, no subjective hyperthermia, no subjective hypothermia Eyes: no blurry vision, no xerophthalmia ENT: no sore throat, no nodules palpated in throat, no dysphagia/odynophagia, no hoarseness Cardiovascular: no Chest Pain, no Shortness of Breath, + palpitations, no leg swelling Respiratory: no cough, no SOB Gastrointestinal: no Nausea/Vomiting/Diarhhea Musculoskeletal: + Short stature, no muscle/joint aches Skin: no rashes, hypopigmented patches consistent with vitiligo. Neurological: + tremors, no numbness, no tingling, no dizziness Psychiatric: no depression, + anxiety  Objective:    BP 105/72   Pulse (!) 102   Ht 4\' 6"  (1.372 m)   Wt 172 lb (78 kg)   BMI 41.47 kg/m   Wt Readings from Last 3 Encounters:  01/24/18 172 lb (78 kg)  10/18/17 172 lb (78 kg)  02/10/17 179 lb (81.2 kg)    Physical Exam  Constitutional: Significantly over weight for hight, not in acute distress, normal state of mind Eyes: PERRLA, EOMI, no exophthalmos ENT: moist mucous membranes, no thyromegaly, no cervical lymphadenopathy Cardiovascular: normal precordial activity, + mildly tachycardic at 102, no Murmur/Rubs/Gallops Respiratory:  adequate breathing efforts, no gross chest deformity, Clear to auscultation bilaterally Gastrointestinal: abdomen soft, Non -tender, No distension, Bowel Sounds present Musculoskeletal: no gross deformities, strength intact in all four extremities Skin: moist, warm, no rashes, +vitiligo Neurological: no tremor with outstretched hands   CMP ( most recent) CMP     Component Value Date/Time   NA 134 (L) 10/22/2015 2141   K 3.9 10/22/2015 2141   CL 105 10/22/2015 2141   CO2 25 10/22/2015 2141   GLUCOSE 105 (H) 10/22/2015 2141   BUN 13 10/22/2015 2141   CREATININE 0.78 10/22/2015 2141   CREATININE 0.56 07/23/2013 0815   CALCIUM 8.1 (L) 10/22/2015 2141   CALCIUM 8.9 07/19/2011 1547   PROT 7.6  10/22/2015 2141   ALBUMIN 3.3 (L) 10/22/2015 2141   AST 22 10/22/2015 2141   ALT 19 10/22/2015 2141   ALKPHOS 93 10/22/2015 2141   BILITOT 0.2 (L) 10/22/2015 2141   GFRNONAA >60 10/22/2015 2141   GFRAA >60 10/22/2015 2141     Diabetic Labs (most recent): Lab Results  Component Value Date   HGBA1C 5.0 02/04/2017   HGBA1C 5.1 07/16/2012   HGBA1C 5.4 10/25/2011     Lipid Panel ( most recent) Lipid Panel     Component Value Date/Time   CHOL 156 07/23/2013 0815   TRIG 131 07/23/2013 0815   HDL 42 07/23/2013 0815   CHOLHDL 3.7 07/23/2013 0815   VLDL 26 07/23/2013 0815   LDLCALC 88 07/23/2013 0815   Recent Results (from the past 2160 hour(s))  TSH     Status: None   Collection Time: 01/15/18  3:13 PM  Result Value Ref Range   TSH 1.276 0.350 - 4.500 uIU/mL  Comment: Performed by a 3rd Generation assay with a functional sensitivity of <=0.01 uIU/mL. Performed at [facility], [address], [place], [place] [address]   T4, free     Status: None   Collection Time: 01/15/18  3:13 PM  Result Value Ref Range   Free T4 1.28 0.82 - 1.77 ng/dL    Comment: (NOTE) Biotin ingestion may interfere with free T4 tests. If the results are inconsistent with the TSH level, previous test results, or the clinical presentation, then consider biotin interference. If needed, order repeat testing after stopping biotin. Performed at [facility], [place] [address], [place], [place] [address]      Assessment & Plan:   1. Hypothyroidism due to Hashimoto's thyroiditis 2. Class 3 severe obesity due to excess calories with serious comorbidity and body mass index (BMI) of 40.0 to 44.9 in adult [facility]) -Review of her medical records indicate that she  has long-standing hypothyroidism since age 88 years. -Her previsit labs are consistent with appropriate replacement with thyroid hormone this time. -She is advised to continue levothyroxine 100 mcg p.o. every morning.  -She is advised to call  clinic if she confirms pregnancy, as she needs up titration of her thyroid hormone during the first 5 weeks of pregnancy.  -She does not have diabetes/prediabetes, her recent A1c was 5%.    -Given her fatigue/snoring, she may have sleep apnea.  She would benefit significantly from weight loss.  She is not ready to give up soda consumption. -  Suggestion is made for her to avoid simple carbohydrates  from her diet including Cakes, Sweet Desserts / Pastries, Ice Cream, Soda (diet and regular), Sweet Tea, Candies, Chips, Cookies, Store Bought Juices, Alcohol in Excess of  1-2 drinks a day, Artificial Sweeteners, and "Sugar-free" Products. This will help patient to have stable blood glucose profile and potentially avoid unintended weight gain.  She will need lab updates including CBC, vitamin D.  I advised her to continue follow-up with her PCP Muse, Noel Journey., PA-C for primary care needs.  Follow up plan: Return in about 1 year (around 01/25/2019) for Follow up with Pre-visit Labs.  Glade Lloyd, MD Phone: [phone]  Fax: [phone]  -  This note was partially dictated with voice recognition software. Similar sounding words can be transcribed inadequately or may not  be corrected upon review.  01/24/2018, 4:47 PM

## 2018-06-04 ENCOUNTER — Telehealth: Payer: Self-pay

## 2018-06-04 NOTE — Telephone Encounter (Signed)
Hailey Thompson is calling asking if it would be ok if she takes Melatonin along with her prescribed medications and she is also asking if you would order a  Hemoglobin she feels her iron is low again, please advise?

## 2018-06-04 NOTE — Telephone Encounter (Signed)
Short-term low-dose melatonin is safe to use.  However, she is supposed to take her thyroid hormone in the morning before breakfast and melatonin before going to bed.  She has to follow up with her PMD regarding her hemoglobin.

## 2018-06-05 NOTE — Telephone Encounter (Signed)
Patient is aware of the recommendation 

## 2018-06-19 ENCOUNTER — Other Ambulatory Visit: Payer: Self-pay

## 2018-06-19 MED ORDER — LEVOTHYROXINE SODIUM 100 MCG PO TABS: ORAL_TABLET | ORAL | 1 refills | Status: DC

## 2018-09-14 ENCOUNTER — Ambulatory Visit: Payer: Medicaid Other | Admitting: "Endocrinology

## 2018-09-14 ENCOUNTER — Other Ambulatory Visit: Payer: Self-pay

## 2018-09-18 ENCOUNTER — Other Ambulatory Visit: Payer: Self-pay

## 2018-09-18 ENCOUNTER — Other Ambulatory Visit (HOSPITAL_COMMUNITY)
Admission: RE | Admit: 2018-09-18 | Discharge: 2018-09-18 | Disposition: A | Discharge status: Home / Self Care | Payer: Medicaid Other | Source: Ambulatory Visit | Attending: "Endocrinology | Admitting: "Endocrinology

## 2018-09-18 DIAGNOSIS — E038 Other specified hypothyroidism: Secondary | ICD-10-CM | POA: Insufficient documentation

## 2018-09-18 DIAGNOSIS — E063 Autoimmune thyroiditis: Secondary | ICD-10-CM | POA: Insufficient documentation

## 2018-09-18 DIAGNOSIS — R5383 Other fatigue: Secondary | ICD-10-CM | POA: Diagnosis present

## 2018-09-18 LAB — CBC WITH DIFFERENTIAL/PLATELET
Abs Immature Granulocytes: 0.03 10*3/uL (ref 0.00–0.07)
Basophils Absolute: 0 10*3/uL (ref 0.0–0.1)
Basophils Relative: 0 %
Eosinophils Absolute: 0.3 10*3/uL (ref 0.0–0.5)
Eosinophils Relative: 3 %
HCT: 30 % — ABNORMAL LOW (ref 36.0–46.0)
Hemoglobin: 8.2 g/dL — ABNORMAL LOW (ref 12.0–15.0)
Immature Granulocytes: 0 %
Lymphocytes Relative: 20 %
Lymphs Abs: 2.1 10*3/uL (ref 0.7–4.0)
MCH: 19.9 pg — ABNORMAL LOW (ref 26.0–34.0)
MCHC: 27.3 g/dL — ABNORMAL LOW (ref 30.0–36.0)
MCV: 72.6 fL — ABNORMAL LOW (ref 80.0–100.0)
Monocytes Absolute: 0.5 10*3/uL (ref 0.1–1.0)
Monocytes Relative: 5 %
Neutro Abs: 7.6 10*3/uL (ref 1.7–7.7)
Neutrophils Relative %: 72 %
Platelets: 433 10*3/uL — ABNORMAL HIGH (ref 150–400)
RBC: 4.13 MIL/uL (ref 3.87–5.11)
RDW: 17.7 % — ABNORMAL HIGH (ref 11.5–15.5)
WBC: 10.6 10*3/uL — ABNORMAL HIGH (ref 4.0–10.5)
nRBC: 0 % (ref 0.0–0.2)

## 2018-09-18 LAB — TSH: TSH: 11.439 u[IU]/mL — ABNORMAL HIGH (ref 0.350–4.500)

## 2018-09-18 LAB — T4, FREE: Free T4: 0.92 ng/dL (ref 0.61–1.12)

## 2018-09-19 ENCOUNTER — Ambulatory Visit: Payer: Medicaid Other | Admitting: "Endocrinology

## 2018-09-19 LAB — VITAMIN D 25 HYDROXY (VIT D DEFICIENCY, FRACTURES): Vit D, 25-Hydroxy: 16.3 ng/mL — ABNORMAL LOW (ref 30.0–100.0)

## 2018-09-26 ENCOUNTER — Encounter: Payer: Self-pay | Admitting: Adult Health

## 2018-09-26 ENCOUNTER — Other Ambulatory Visit: Payer: Self-pay

## 2018-09-26 ENCOUNTER — Encounter: Payer: Self-pay | Admitting: "Endocrinology

## 2018-09-26 ENCOUNTER — Ambulatory Visit (INDEPENDENT_AMBULATORY_CARE_PROVIDER_SITE_OTHER): Payer: Medicaid Other | Admitting: "Endocrinology

## 2018-09-26 ENCOUNTER — Ambulatory Visit (INDEPENDENT_AMBULATORY_CARE_PROVIDER_SITE_OTHER): Payer: Medicaid Other | Admitting: Adult Health

## 2018-09-26 VITALS — Ht <= 58 in

## 2018-09-26 DIAGNOSIS — R5383 Other fatigue: Secondary | ICD-10-CM

## 2018-09-26 DIAGNOSIS — E063 Autoimmune thyroiditis: Secondary | ICD-10-CM

## 2018-09-26 DIAGNOSIS — O3680X0 Pregnancy with inconclusive fetal viability, not applicable or unspecified: Secondary | ICD-10-CM | POA: Diagnosis not present

## 2018-09-26 DIAGNOSIS — Z3201 Encounter for pregnancy test, result positive: Secondary | ICD-10-CM

## 2018-09-26 DIAGNOSIS — E559 Vitamin D deficiency, unspecified: Secondary | ICD-10-CM

## 2018-09-26 DIAGNOSIS — D500 Iron deficiency anemia secondary to blood loss (chronic): Secondary | ICD-10-CM

## 2018-09-26 DIAGNOSIS — E038 Other specified hypothyroidism: Secondary | ICD-10-CM | POA: Diagnosis not present

## 2018-09-26 DIAGNOSIS — Z3A01 Less than 8 weeks gestation of pregnancy: Secondary | ICD-10-CM | POA: Insufficient documentation

## 2018-09-26 DIAGNOSIS — Z98891 History of uterine scar from previous surgery: Secondary | ICD-10-CM | POA: Insufficient documentation

## 2018-09-26 MED ORDER — FERROUS SULFATE 325 (65 FE) MG PO TABS: 325.0000 mg | ORAL_TABLET | Freq: Every day | ORAL | 3 refills | Status: DC

## 2018-09-26 MED ORDER — LEVOTHYROXINE SODIUM 125 MCG PO TABS: 125.0000 ug | ORAL_TABLET | Freq: Every day | ORAL | 3 refills | Status: DC

## 2018-09-26 MED ORDER — VITAMIN D3 125 MCG (5000 UT) PO CAPS: 5000.0000 [IU] | ORAL_CAPSULE | Freq: Every day | ORAL | 0 refills | Status: DC

## 2018-09-26 NOTE — Progress Notes (Signed)
09/26/2018                                Endocrinology Telehealth Visit Follow up Note -During COVID -19 Pandemic  I connected with Hailey Thompson on 09/26/2018   by telephone and verified that I am speaking with the correct person using two identifiers. Hailey Thompson, [birth year]. she has verbally consented to this visit. All issues noted in this document were discussed and addressed. The format was not optimal for physical exam.    Subjective:    Patient ID: Hailey Thompson, female    DOB: 1995/04/20, PCP Muse, Noel Journey., PA-C   Past Medical History:  Diagnosis Date  . Migraine   . Overactive thyroid gland   . Thyroid disease   . UTI (lower urinary tract infection)    Past Surgical History:  Procedure Laterality Date  . APPENDECTOMY    . CESAREAN SECTION    . CHOLECYSTECTOMY N/A 11/06/2015   Procedure: LAPAROSCOPIC CHOLECYSTECTOMY;  Surgeon: Vickie Epley, MD;  Location: [facility];  Service: General;  Laterality: N/A;   Social History   Socioeconomic History  . Marital status: Married    Spouse name: Not on file  . Number of children: Not on file  . Years of education: Not on file  . Highest education level: Not on file  Occupational History  . Occupation: homemaker  Social Needs  . Financial resource strain: Not on file  . Food insecurity    Worry: Not on file    Inability: Not on file  . Transportation needs    Medical: Not on file    Non-medical: Not on file  Tobacco Use  . Smoking status: Never Smoker  . Smokeless tobacco: Never Used  Substance and Sexual Activity  . Alcohol use: No  . Drug use: No  . Sexual activity: Yes    Birth control/protection: None  Lifestyle  . Physical activity    Days per week: Not on file    Minutes per session: Not on file  . Stress: Not on file  Relationships  . Social Herbalist on phone: Not on file    Gets together: Not on file    Attends religious service: Not on file    Active member of club or organization: Not on  file    Attends meetings of clubs or organizations: Not on file    Relationship status: Not on file  Other Topics Concern  . Not on file  Social History Narrative   [name] is in 27 th grade at [employer].  Lives with Benita Stabile (Cousin/legal guardian)   Outpatient Encounter Medications as of 09/26/2018  Medication Sig  . Cholecalciferol (VITAMIN D3) 125 MCG (5000 UT) CAPS Take 1 capsule (5,000 Units total) by mouth daily.  . ferrous sulfate (FERROUSUL) 325 (65 FE) MG tablet Take 1 tablet (325 mg total) by mouth daily with breakfast.  . levothyroxine (SYNTHROID) 125 MCG tablet Take 1 tablet (125 mcg total) by mouth daily before breakfast.  . [DISCONTINUED] famotidine (PEPCID) 20 MG tablet Take 1 tablet (20 mg total) by mouth 2 (two) times daily.   No facility-administered encounter medications on file as of 09/26/2018.    ALLERGIES: Allergies  Allergen Reactions  . Mushroom Extract Complex Anaphylaxis  . Chocolate Itching  . Oxycodone Diarrhea  . Prednisone Other (See Comments)    hallucinations  . Versed [Midazolam] Hives  Hives after IV administration of zofran and versed. Unsure which medication caused reaction  . Zofran [Ondansetron Hcl] Hives    Hives after IV adminstration of zofran and versed. Unsure of which medication caused reaction.     VACCINATION STATUS: Immunization History  Administered Date(s) Administered  . Influenza Whole 02/14/2011    HPI 23 year old female patient with medical history as above.  She is being engaged in telehealth for follow-up of her hypothyroidism.    She is on levothyroxine 100 mcg p.o. every morning.  She reports with better compliance with this medication at this time. - She reports that she was diagnosed with hypothyroidism at age 26 years, treated with various doses of levothyroxine over the years. -The past, there has been documented inconsistencies in her intake of his thyroid hormone .    -She has no new complaints  today, however still complains of fatigue. -Her labs incidentally showed anemia of iron deficiency which she has known previously about.  She has not taken her iron supplements in several.  She also responds affirmatively for heavy menstrual cycles.   -She has not discussed this with her OB/GYN providers. - She has family history of hypothyroidism in her mother. She denies family history of thyroid cancer. She denies any personal history of goiter.   Review of Systems Limited as above.    Objective:    LMP 08/14/2018   Wt Readings from Last 3 Encounters:  01/24/18 172 lb (78 kg)  10/18/17 172 lb (78 kg)  02/10/17 179 lb (81.2 kg)     CMP     Component Value Date/Time   NA 134 (L) 10/22/2015 2141   K 3.9 10/22/2015 2141   CL 105 10/22/2015 2141   CO2 25 10/22/2015 2141   GLUCOSE 105 (H) 10/22/2015 2141   BUN 13 10/22/2015 2141   CREATININE 0.78 10/22/2015 2141   CREATININE 0.56 07/23/2013 0815   CALCIUM 8.1 (L) 10/22/2015 2141   CALCIUM 8.9 07/19/2011 1547   PROT 7.6 10/22/2015 2141   ALBUMIN 3.3 (L) 10/22/2015 2141   AST 22 10/22/2015 2141   ALT 19 10/22/2015 2141   ALKPHOS 93 10/22/2015 2141   BILITOT 0.2 (L) 10/22/2015 2141   GFRNONAA >60 10/22/2015 2141   GFRAA >60 10/22/2015 2141     Diabetic Labs (most recent): Lab Results  Component Value Date   HGBA1C 5.0 02/04/2017   HGBA1C 5.1 07/16/2012   HGBA1C 5.4 10/25/2011     Lipid Panel ( most recent) Lipid Panel     Component Value Date/Time   CHOL 156 07/23/2013 0815   TRIG 131 07/23/2013 0815   HDL 42 07/23/2013 0815   CHOLHDL 3.7 07/23/2013 0815   VLDL 26 07/23/2013 0815   LDLCALC 88 07/23/2013 0815   Recent Results (from the past 2160 hour(s))  TSH     Status: Abnormal   Collection Time: 09/18/18 10:45 AM  Result Value Ref Range   TSH 11.439 (H) 0.350 - 4.500 uIU/mL    Comment: Performed by a 3rd Generation assay with a functional sensitivity of <=0.01 uIU/mL. Performed at [facility],  [address], [place], [place] [address]   T4, free     Status: None   Collection Time: 09/18/18 10:45 AM  Result Value Ref Range   Free T4 0.92 0.61 - 1.12 ng/dL    Comment: (NOTE) Biotin ingestion may interfere with free T4 tests. If the results are inconsistent with the TSH level, previous test results, or the clinical presentation, then  consider biotin interference. If needed, order repeat testing after stopping biotin. Performed at [facility], [place] [address], [place], [place] [address]   VITAMIN D 25 Hydroxy (Vit-D Deficiency, Fractures)     Status: Abnormal   Collection Time: 09/18/18 10:45 AM  Result Value Ref Range   Vit D, 25-Hydroxy 16.3 (L) 30.0 - 100.0 ng/mL    Comment: (NOTE) Vitamin D deficiency has been defined by the Institute of Medicine and an Endocrine Society practice guideline as a level of serum 25-OH vitamin D less than 20 ng/mL (1,2). The Endocrine Society went on to further define vitamin D insufficiency as a level between 21 and 29 ng/mL (2). 1. IOM (Institute of Medicine). 2010. Dietary reference   intakes for calcium and D. [place]: The   [employer]. 2. Holick MF, Binkley Descanso, Bischoff-Ferrari HA, et al.   Evaluation, treatment, and prevention of vitamin D   deficiency: an Endocrine Society clinical practice   guideline. JCEM. 2011 Jul; 96(7):1911-30. Performed At: [facility] [place], [place] [id number] Rush Farmer MD IW:[id number]   CBC with Differential/Platelet     Status: Abnormal   Collection Time: 09/18/18 10:45 AM  Result Value Ref Range   WBC 10.6 (H) 4.0 - 10.5 K/uL   RBC 4.13 3.87 - 5.11 MIL/uL   Hemoglobin 8.2 (L) 12.0 - 15.0 g/dL    Comment: Reticulocyte Hemoglobin testing may be clinically indicated, consider ordering this additional test [id number]    HCT 30.0 (L) 36.0 - 46.0 %   MCV 72.6 (L) 80.0 - 100.0 fL   MCH 19.9 (L) 26.0 - 34.0 pg   MCHC 27.3 (L) 30.0 - 36.0 g/dL   RDW  17.7 (H) 11.5 - 15.5 %   Platelets 433 (H) 150 - 400 K/uL   nRBC 0.0 0.0 - 0.2 %   Neutrophils Relative % 72 %   Neutro Abs 7.6 1.7 - 7.7 K/uL   Lymphocytes Relative 20 %   Lymphs Abs 2.1 0.7 - 4.0 K/uL   Monocytes Relative 5 %   Monocytes Absolute 0.5 0.1 - 1.0 K/uL   Eosinophils Relative 3 %   Eosinophils Absolute 0.3 0.0 - 0.5 K/uL   Basophils Relative 0 %   Basophils Absolute 0.0 0.0 - 0.1 K/uL   Immature Granulocytes 0 %   Abs Immature Granulocytes 0.03 0.00 - 0.07 K/uL    Comment: Performed at [facility], [address], [place], [place] [address]     Assessment & Plan:   1. Hypothyroidism due to Hashimoto's thyroiditis 2. Class 3 severe obesity  3.  Microcytic anemia-chronic blood loss 4.  Vitamin D deficiency -Thyroid function tests is consistent with under replacement with levothyroxine.  I discussed and increase her levothyroxine to 125 mcg p.o. before breakfast.  - We discussed about the correct intake of her thyroid hormone, on empty stomach at fasting, with water, separated by at least 30 minutes from breakfast and other medications,  and separated by more than 4 hours from calcium, iron, multivitamins, acid reflux medications (PPIs). -Patient is made aware of the fact that thyroid hormone replacement is needed for life, dose to be adjusted by periodic monitoring of thyroid function tests.  -Labs incidentally showed severe anemia with hemoglobin of 8.2.  She has known about iron deficiency anemia for some time, rather interrupted her iron supplements for unclear reasons.  I discussed and initiated ferrous sulfate 325 mg p.o.3 times daily for her.  She will have repeat  CBC in 53-month.  She is also advised to seek pelvic ultrasound evaluation through her OB/GYN given her heavy menorrhagia.  -Discussed and initiated vitamin D3 supplement 5000 units daily for the next 90 days. -She is advised to call clinic if she confirms pregnancy, as she needs up titration of her thyroid  hormone during the first 5 weeks of pregnancy.  -She does not have diabetes/prediabetes, her recent A1c was 5%.   - she  admits there is a room for improvement in her diet and drink choices. -  Suggestion is made for her to avoid simple carbohydrates  from her diet including Cakes, Sweet Desserts / Pastries, Ice Cream, Soda (diet and regular), Sweet Tea, Candies, Chips, Cookies, Sweet Pastries,  Store Bought Juices, Alcohol in Excess of  1-2 drinks a day, Artificial Sweeteners, Coffee Creamer, and "Sugar-free" Products. This will help patient to have stable blood glucose profile and potentially avoid unintended weight gain.   Time for this visit: 15 minutes. Hailey Thompson  participated in the discussions, expressed understanding, and voiced agreement with the above plans.  All questions were answered to her satisfaction. she is encouraged to contact clinic should she have any questions or concerns prior to her return visit.   Follow up plan: Return in about 4 months (around 01/27/2019), or she will do CBC 2 x (  in 1 month , as well as part of her next previsit labs).Glade Lloyd, MD Phone: [phone]  Fax: [phone]  -  This note was partially dictated with voice recognition software. Similar sounding words can be transcribed inadequately or may not  be corrected upon review.  09/26/2018, 7:35 PM

## 2018-09-26 NOTE — Progress Notes (Signed)
Patient ID: Hailey Thompson, female   DOB: November 15, 1995, 23 y.o.   MRN: [id number]   TELEHEALTH VIRTUAL GYNECOLOGY VISIT ENCOUNTER NOTE  I connected with Hailey Thompson on 09/26/18 at  1:30 PM EDT by telephone at home and verified that I am speaking with the correct person using two identifiers.   I discussed the limitations, risks, security and privacy concerns of performing an evaluation and management service by telephone and the availability of in person appointments. I also discussed with the patient that there may be a patient responsible charge related to this service. The patient expressed understanding and agreed to proceed.   History:  Herbert Aguinaldo is a 23 y.o. [id number],white female, married, being evaluated today for missing a period and had 4+HPTs, so about 6+1 week by LMP, 08/14/18, with EDD 05/21/19.She has had some nausea and mild cramping every other day.  She denies any abnormal vaginal discharge, bleeding,  or other concerns.    PCP is [employer] PA.    Past Medical History:  Diagnosis Date  . Migraine   . Overactive thyroid gland   . Thyroid disease   . UTI (lower urinary tract infection)    Past Surgical History:  Procedure Laterality Date  . APPENDECTOMY    . CESAREAN SECTION    . CHOLECYSTECTOMY N/A 11/06/2015   Procedure: LAPAROSCOPIC CHOLECYSTECTOMY;  Surgeon: Vickie Epley, MD;  Location: [facility];  Service: General;  Laterality: N/A;   The following portions of the patient's history were reviewed and updated as appropriate: allergies, current medications, past family history, past medical history, past social history, past surgical history and problem list.   Health Maintenance:  Pap at new OB  Review of Systems:  Pertinent items noted in HPI and remainder of comprehensive ROS otherwise negative.  Physical Exam:   General:  Alert, oriented and cooperative.   Mental Status: Normal mood and affect perceived. Normal judgment and thought content.  Physical exam  deferred due to nature of the encounter Ht 4\' 6"  (1.372 m)   LMP 08/14/2018   BMI 41.47 kg/m per pt. Fall risk is low.  Labs and Imaging Results for orders placed or performed during the hospital encounter of 09/18/18 (from the past 336 hour(s))  TSH   Collection Time: 09/18/18 10:45 AM  Result Value Ref Range   TSH 11.439 (H) 0.350 - 4.500 uIU/mL  T4, free   Collection Time: 09/18/18 10:45 AM  Result Value Ref Range   Free T4 0.92 0.61 - 1.12 ng/dL  VITAMIN D 25 Hydroxy (Vit-D Deficiency, Fractures)   Collection Time: 09/18/18 10:45 AM  Result Value Ref Range   Vit D, 25-Hydroxy 16.3 (L) 30.0 - 100.0 ng/mL  CBC with Differential/Platelet   Collection Time: 09/18/18 10:45 AM  Result Value Ref Range   WBC 10.6 (H) 4.0 - 10.5 K/uL   RBC 4.13 3.87 - 5.11 MIL/uL   Hemoglobin 8.2 (L) 12.0 - 15.0 g/dL   HCT 30.0 (L) 36.0 - 46.0 %   MCV 72.6 (L) 80.0 - 100.0 fL   MCH 19.9 (L) 26.0 - 34.0 pg   MCHC 27.3 (L) 30.0 - 36.0 g/dL   RDW 17.7 (H) 11.5 - 15.5 %   Platelets 433 (H) 150 - 400 K/uL   nRBC 0.0 0.0 - 0.2 %   Neutrophils Relative % 72 %   Neutro Abs 7.6 1.7 - 7.7 K/uL   Lymphocytes Relative 20 %   Lymphs Abs 2.1 0.7 - 4.0 K/uL   Monocytes Relative  5 %   Monocytes Absolute 0.5 0.1 - 1.0 K/uL   Eosinophils Relative 3 %   Eosinophils Absolute 0.3 0.0 - 0.5 K/uL   Basophils Relative 0 %   Basophils Absolute 0.0 0.0 - 0.1 K/uL   Immature Granulocytes 0 %   Abs Immature Granulocytes 0.03 0.00 - 0.07 K/uL   No results found.    Assessment and Plan:     1. Positive pregnancy test -4 +HPTs   2. Less than [redacted] weeks gestation of pregnancy -take PNV -eat often and push fluids  3. Encounter to determine fetal viability of pregnancy, single or unspecified fetus -dating [place] in 2 weeks  - US OB Comp Less 14 Wks; Future  4. History of cesarean section -she wants repeat  C-section       I discussed the assessment and treatment plan with the patient. The patient was provided an  opportunity to ask questions and all were answered. The patient agreed with the plan and demonstrated an understanding of the instructions.   The patient was advised to call back or seek an in-person evaluation/go to the ED if the symptoms worsen or if the condition fails to improve as anticipated.  I provided 6  minutes of non-face-to-face time during this encounter.   Derrek Monaco, NP [facility], [place]

## 2018-10-01 ENCOUNTER — Telehealth: Payer: Self-pay | Admitting: Adult Health

## 2018-10-01 MED ORDER — PROMETHAZINE HCL 25 MG PO TABS: 25.0000 mg | ORAL_TABLET | Freq: Four times a day (QID) | ORAL | 1 refills | Status: DC | PRN

## 2018-10-01 NOTE — Addendum Note (Signed)
Addended by: Derrek Monaco A on: 10/01/2018 01:12 PM   Modules accepted: Orders

## 2018-10-01 NOTE — Telephone Encounter (Signed)
Pt would like a nurse to call her needs to get something called in for nausea .  Uses  [facility]

## 2018-10-01 NOTE — Telephone Encounter (Signed)
rx sent for phenergan for nausea to [facility]

## 2018-10-04 ENCOUNTER — Ambulatory Visit (INDEPENDENT_AMBULATORY_CARE_PROVIDER_SITE_OTHER): Payer: Medicaid Other

## 2018-10-04 ENCOUNTER — Other Ambulatory Visit: Payer: Self-pay

## 2018-10-04 DIAGNOSIS — Z3A01 Less than 8 weeks gestation of pregnancy: Secondary | ICD-10-CM

## 2018-10-04 DIAGNOSIS — O3680X0 Pregnancy with inconclusive fetal viability, not applicable or unspecified: Secondary | ICD-10-CM

## 2018-10-04 NOTE — Progress Notes (Signed)
[place] 7+2 wks,single IUP w/ys,positive fht 143 bpm,normal ovaries bilat,crl 11.89 mm

## 2018-10-08 ENCOUNTER — Other Ambulatory Visit: Payer: Medicaid Other

## 2018-10-16 ENCOUNTER — Telehealth: Payer: Self-pay | Admitting: Women's Health

## 2018-10-16 ENCOUNTER — Telehealth: Payer: Self-pay | Admitting: *Deleted

## 2018-10-16 ENCOUNTER — Ambulatory Visit: Payer: Medicaid Other | Admitting: *Deleted

## 2018-10-16 ENCOUNTER — Other Ambulatory Visit: Payer: Self-pay

## 2018-10-16 DIAGNOSIS — Z87440 Personal history of urinary (tract) infections: Secondary | ICD-10-CM

## 2018-10-16 DIAGNOSIS — R102 Pelvic and perineal pain: Secondary | ICD-10-CM

## 2018-10-16 DIAGNOSIS — O26899 Other specified pregnancy related conditions, unspecified trimester: Secondary | ICD-10-CM

## 2018-10-16 LAB — POCT URINALYSIS DIPSTICK OB
Blood, UA: NEGATIVE
Glucose, UA: NEGATIVE
Ketones, UA: NEGATIVE
Leukocytes, UA: NEGATIVE
Nitrite, UA: NEGATIVE
POC,PROTEIN,UA: NEGATIVE

## 2018-10-16 NOTE — Telephone Encounter (Signed)
Patient states she woke up this morning with extreme lower abdominal pressure.  She has not noticed any bleeding and pain is mainly located on her right side and into her back.  States she had issues with UTI's during her last pregnancy.  No pain or burning noted during urination.  Advised patient to come to our office for urine dip and culture.  Verbalized understanding.

## 2018-10-16 NOTE — Telephone Encounter (Signed)
Having concerns she would like to discuss with a nurse

## 2018-10-18 LAB — URINE CULTURE

## 2018-10-29 ENCOUNTER — Other Ambulatory Visit: Payer: Self-pay | Admitting: "Endocrinology

## 2018-10-30 LAB — CBC/DIFF AMBIGUOUS DEFAULT
Basophils Absolute: 0 10*3/uL (ref 0.0–0.2)
Basos: 0 %
EOS (ABSOLUTE): 0.2 10*3/uL (ref 0.0–0.4)
Eos: 2 %
Hematocrit: 32.3 % — ABNORMAL LOW (ref 34.0–46.6)
Hemoglobin: 9.9 g/dL — ABNORMAL LOW (ref 11.1–15.9)
Immature Grans (Abs): 0 10*3/uL (ref 0.0–0.1)
Immature Granulocytes: 0 %
Lymphocytes Absolute: 2.3 10*3/uL (ref 0.7–3.1)
Lymphs: 19 %
MCH: 24.1 pg — ABNORMAL LOW (ref 26.6–33.0)
MCHC: 30.7 g/dL — ABNORMAL LOW (ref 31.5–35.7)
MCV: 79 fL (ref 79–97)
Monocytes Absolute: 0.5 10*3/uL (ref 0.1–0.9)
Monocytes: 4 %
Neutrophils Absolute: 9 10*3/uL — ABNORMAL HIGH (ref 1.4–7.0)
Neutrophils: 75 %
Platelets: 260 10*3/uL (ref 150–450)
RBC: 4.1 x10E6/uL (ref 3.77–5.28)
WBC: 12.1 10*3/uL — ABNORMAL HIGH (ref 3.4–10.8)

## 2018-11-02 ENCOUNTER — Other Ambulatory Visit: Payer: Self-pay | Admitting: Obstetrics & Gynecology

## 2018-11-02 DIAGNOSIS — Z3682 Encounter for antenatal screening for nuchal translucency: Secondary | ICD-10-CM

## 2018-11-05 ENCOUNTER — Encounter: Payer: Self-pay | Admitting: Women's Health

## 2018-11-05 ENCOUNTER — Ambulatory Visit (INDEPENDENT_AMBULATORY_CARE_PROVIDER_SITE_OTHER): Payer: Medicaid Other | Admitting: Women's Health

## 2018-11-05 ENCOUNTER — Other Ambulatory Visit: Payer: Self-pay

## 2018-11-05 ENCOUNTER — Other Ambulatory Visit (HOSPITAL_COMMUNITY)
Admission: RE | Admit: 2018-11-05 | Discharge: 2018-11-05 | Disposition: A | Discharge status: Home / Self Care | Payer: Medicaid Other | Source: Ambulatory Visit | Attending: Obstetrics and Gynecology | Admitting: Obstetrics and Gynecology

## 2018-11-05 ENCOUNTER — Ambulatory Visit (INDEPENDENT_AMBULATORY_CARE_PROVIDER_SITE_OTHER): Payer: Medicaid Other

## 2018-11-05 VITALS — BP 108/64 | HR 84 | Wt 178.0 lb

## 2018-11-05 DIAGNOSIS — Z3481 Encounter for supervision of other normal pregnancy, first trimester: Secondary | ICD-10-CM | POA: Diagnosis not present

## 2018-11-05 DIAGNOSIS — Z1371 Encounter for nonprocreative screening for genetic disease carrier status: Secondary | ICD-10-CM

## 2018-11-05 DIAGNOSIS — E039 Hypothyroidism, unspecified: Secondary | ICD-10-CM

## 2018-11-05 DIAGNOSIS — Z124 Encounter for screening for malignant neoplasm of cervix: Secondary | ICD-10-CM | POA: Insufficient documentation

## 2018-11-05 DIAGNOSIS — Z331 Pregnant state, incidental: Secondary | ICD-10-CM

## 2018-11-05 DIAGNOSIS — Z3A11 11 weeks gestation of pregnancy: Secondary | ICD-10-CM

## 2018-11-05 DIAGNOSIS — Z1389 Encounter for screening for other disorder: Secondary | ICD-10-CM

## 2018-11-05 DIAGNOSIS — Z3682 Encounter for antenatal screening for nuchal translucency: Secondary | ICD-10-CM

## 2018-11-05 DIAGNOSIS — Z98891 History of uterine scar from previous surgery: Secondary | ICD-10-CM | POA: Diagnosis not present

## 2018-11-05 DIAGNOSIS — O99284 Endocrine, nutritional and metabolic diseases complicating childbirth: Secondary | ICD-10-CM

## 2018-11-05 DIAGNOSIS — Z3490 Encounter for supervision of normal pregnancy, unspecified, unspecified trimester: Secondary | ICD-10-CM | POA: Insufficient documentation

## 2018-11-05 DIAGNOSIS — Z363 Encounter for antenatal screening for malformations: Secondary | ICD-10-CM

## 2018-11-05 DIAGNOSIS — Z1379 Encounter for other screening for genetic and chromosomal anomalies: Secondary | ICD-10-CM

## 2018-11-05 LAB — POCT URINALYSIS DIPSTICK OB
Blood, UA: NEGATIVE
Glucose, UA: NEGATIVE
Ketones, UA: NEGATIVE
Leukocytes, UA: NEGATIVE
Nitrite, UA: NEGATIVE
POC,PROTEIN,UA: NEGATIVE

## 2018-11-05 MED ORDER — BLOOD PRESSURE MONITOR MISC: 0 refills | Status: DC

## 2018-11-05 NOTE — Patient Instructions (Signed)
Hailey Thompson, I greatly value your feedback.  If you receive a survey following your visit with [place] today, we appreciate you taking the time to fill it out.  Thanks, Hailey Thompson, CNM, [facility]  [place]!!! It is now [name] ([name], [place] [address]) Entrance located off of [address] parking   Nausea & Vomiting  Have saltine crackers or pretzels by your bed and eat a few bites before you raise your head out of bed in the morning  Eat small frequent meals throughout the day instead of large meals  Drink plenty of fluids throughout the day to stay hydrated, just don't drink a lot of fluids with your meals.  This can make your stomach fill up faster making you feel sick  Do not brush your teeth right after you eat  Products with real ginger are good for nausea, like ginger ale and ginger hard candy Make sure it says made with real ginger!  Sucking on sour candy like lemon heads is also good for nausea  If your prenatal vitamins make you nauseated, take them at night so you will sleep through the nausea  Sea Bands  If you feel like you need medicine for the nausea & vomiting please let us know  If you are unable to keep any fluids or food down please let us know   Constipation  Drink plenty of fluid, preferably water, throughout the day  Eat foods high in fiber such as fruits, vegetables, and grains  Exercise, such as walking, is a good way to keep your bowels regular  Drink warm fluids, especially warm prune juice, or decaf coffee  Eat a 1/2 cup of real oatmeal (not instant), 1/2 cup applesauce, and 1/2-1 cup warm prune juice every day  If needed, you may take Colace (docusate sodium) stool softener once or twice a day to help keep the stool soft.   If you still are having problems with constipation, you may take Miralax once daily as needed to help keep your bowels regular.   Home Blood  Pressure Monitoring for Patients   Your provider has recommended that you check your blood pressure (BP) at least once a week at home. If you do not have a blood pressure cuff at home, one will be provided for you. Contact your provider if you have not received your monitor within 1 week.   Helpful Tips for Accurate Home Blood Pressure Checks  . Don't smoke, exercise, or drink caffeine 30 minutes before checking your BP . Use the restroom before checking your BP (a full bladder can raise your pressure) . Relax in a comfortable upright chair . Feet on the ground . Left arm resting comfortably on a flat surface at the level of your heart . Legs uncrossed . Back supported . Sit quietly and don't talk . Place the cuff on your bare arm . Adjust snuggly, so that only two fingertips can fit between your skin and the top of the cuff . Check 2 readings separated by at least one minute . Keep a log of your BP readings . For a visual, please reference this diagram: http://ccnc.care/bpdiagram  Provider Name: [facility]     Phone: [phone]  Zone 1: ALL CLEAR  Continue to monitor your symptoms:  . BP reading is less than 140 (top number) or less than 90 (bottom number)  . No right upper stomach pain .  No headaches or seeing spots . No feeling nauseated or throwing up . No swelling in face and hands  Zone 2: CAUTION Call your doctor's office for any of the following:  . BP reading is greater than 140 (top number) or greater than 90 (bottom number)  . Stomach pain under your ribs in the middle or right side . Headaches or seeing spots . Feeling nauseated or throwing up . Swelling in face and hands  Zone 3: EMERGENCY  Seek immediate medical care if you have any of the following:  . BP reading is greater than160 (top number) or greater than 110 (bottom number) . Severe headaches not improving with Tylenol . Serious difficulty catching your breath . Any worsening symptoms from Zone  2    First Trimester of Pregnancy The first trimester of pregnancy is from week 1 until the end of week 12 (months 1 through 3). A week after a sperm fertilizes an egg, the egg will implant on the wall of the uterus. This embryo will begin to develop into a baby. Genes from you and your partner are forming the baby. The female genes determine whether the baby is a boy or a girl. At 6-8 weeks, the eyes and face are formed, and the heartbeat can be seen on ultrasound. At the end of 12 weeks, all the baby's organs are formed.  Now that you are pregnant, you will want to do everything you can to have a healthy baby. Two of the most important things are to get good prenatal care and to follow your health care provider's instructions. Prenatal care is all the medical care you receive before the baby's birth. This care will help prevent, find, and treat any problems during the pregnancy and childbirth. BODY CHANGES Your body goes through many changes during pregnancy. The changes vary from woman to woman.   You may gain or lose a couple of pounds at first.  You may feel sick to your stomach (nauseous) and throw up (vomit). If the vomiting is uncontrollable, call your health care provider.  You may tire easily.  You may develop headaches that can be relieved by medicines approved by your health care provider.  You may urinate more often. Painful urination may mean you have a bladder infection.  You may develop heartburn as a result of your pregnancy.  You may develop constipation because certain hormones are causing the muscles that push waste through your intestines to slow down.  You may develop hemorrhoids or swollen, bulging veins (varicose veins).  Your breasts may begin to grow larger and become tender. Your nipples may stick out more, and the tissue that surrounds them (areola) may become darker.  Your gums may bleed and may be sensitive to brushing and flossing.  Dark spots or blotches  (chloasma, mask of pregnancy) may develop on your face. This will likely fade after the baby is born.  Your menstrual periods will stop.  You may have a loss of appetite.  You may develop cravings for certain kinds of food.  You may have changes in your emotions from day to day, such as being excited to be pregnant or being concerned that something may go wrong with the pregnancy and baby.  You may have more vivid and strange dreams.  You may have changes in your hair. These can include thickening of your hair, rapid growth, and changes in texture. Some women also have hair loss during or after pregnancy, or hair that feels dry  or thin. Your hair will most likely return to normal after your baby is born. WHAT TO EXPECT AT YOUR PRENATAL VISITS During a routine prenatal visit:  You will be weighed to make sure you and the baby are growing normally.  Your blood pressure will be taken.  Your abdomen will be measured to track your baby's growth.  The fetal heartbeat will be listened to starting around week 10 or 12 of your pregnancy.  Test results from any previous visits will be discussed. Your health care provider may ask you:  How you are feeling.  If you are feeling the baby move.  If you have had any abnormal symptoms, such as leaking fluid, bleeding, severe headaches, or abdominal cramping.  If you have any questions. Other tests that may be performed during your first trimester include:  Blood tests to find your blood type and to check for the presence of any previous infections. They will also be used to check for low iron levels (anemia) and Rh antibodies. Later in the pregnancy, blood tests for diabetes will be done along with other tests if problems develop.  Urine tests to check for infections, diabetes, or protein in the urine.  An ultrasound to confirm the proper growth and development of the baby.  An amniocentesis to check for possible genetic problems.  Fetal  screens for spina bifida and Down syndrome.  You may need other tests to make sure you and the baby are doing well. HOME CARE INSTRUCTIONS  Medicines  Follow your health care provider's instructions regarding medicine use. Specific medicines may be either safe or unsafe to take during pregnancy.  Take your prenatal vitamins as directed.  If you develop constipation, try taking a stool softener if your health care provider approves. Diet  Eat regular, well-balanced meals. Choose a variety of foods, such as meat or vegetable-based protein, fish, milk and low-fat dairy products, vegetables, fruits, and whole grain breads and cereals. Your health care provider will help you determine the amount of weight gain that is right for you.  Avoid raw meat and uncooked cheese. These carry germs that can cause birth defects in the baby.  Eating four or five small meals rather than three large meals a day may help relieve nausea and vomiting. If you start to feel nauseous, eating a few soda crackers can be helpful. Drinking liquids between meals instead of during meals also seems to help nausea and vomiting.  If you develop constipation, eat more high-fiber foods, such as fresh vegetables or fruit and whole grains. Drink enough fluids to keep your urine clear or pale yellow. Activity and Exercise  Exercise only as directed by your health care provider. Exercising will help you:  Control your weight.  Stay in shape.  Be prepared for labor and delivery.  Experiencing pain or cramping in the lower abdomen or low back is a good sign that you should stop exercising. Check with your health care provider before continuing normal exercises.  Try to avoid standing for long periods of time. Move your legs often if you must stand in one place for a long time.  Avoid heavy lifting.  Wear low-heeled shoes, and practice good posture.  You may continue to have sex unless your health care provider directs you  otherwise. Relief of Pain or Discomfort  Wear a good support bra for breast tenderness.    Take warm sitz baths to soothe any pain or discomfort caused by hemorrhoids. Use hemorrhoid cream if your  health care provider approves.    Rest with your legs elevated if you have leg cramps or low back pain.  If you develop varicose veins in your legs, wear support hose. Elevate your feet for 15 minutes, 3-4 times a day. Limit salt in your diet. Prenatal Care  Schedule your prenatal visits by the twelfth week of pregnancy. They are usually scheduled monthly at first, then more often in the last 2 months before delivery.  Write down your questions. Take them to your prenatal visits.  Keep all your prenatal visits as directed by your health care provider. Safety  Wear your seat belt at all times when driving.  Make a list of emergency phone numbers, including numbers for family, friends, the hospital, and police and fire departments. General Tips  Ask your health care provider for a referral to a local prenatal education class. Begin classes no later than at the beginning of month 6 of your pregnancy.  Ask for help if you have counseling or nutritional needs during pregnancy. Your health care provider can offer advice or refer you to specialists for help with various needs.  Do not use hot tubs, steam rooms, or saunas.  Do not douche or use tampons or scented sanitary pads.  Do not cross your legs for long periods of time.  Avoid cat litter boxes and soil used by cats. These carry germs that can cause birth defects in the baby and possibly loss of the fetus by miscarriage or stillbirth.  Avoid all smoking, herbs, alcohol, and medicines not prescribed by your health care provider. Chemicals in these affect the formation and growth of the baby.  Schedule a dentist appointment. At home, brush your teeth with a soft toothbrush and be gentle when you floss. SEEK MEDICAL CARE IF:   You have  dizziness.  You have mild pelvic cramps, pelvic pressure, or nagging pain in the abdominal area.  You have persistent nausea, vomiting, or diarrhea.  You have a bad smelling vaginal discharge.  You have pain with urination.  You notice increased swelling in your face, hands, legs, or ankles. SEEK IMMEDIATE MEDICAL CARE IF:   You have a fever.  You are leaking fluid from your vagina.  You have spotting or bleeding from your vagina.  You have severe abdominal cramping or pain.  You have rapid weight gain or loss.  You vomit blood or material that looks like coffee grounds.  You are exposed to [place] measles and have never had them.  You are exposed to fifth disease or chickenpox.  You develop a severe headache.  You have shortness of breath.  You have any kind of trauma, such as from a fall or a car accident. Document Released: 03/08/2001 Document Revised: 07/29/2013 Document Reviewed: 01/22/2013 [facility] Patient Information 2015 [place], [place]. This information is not intended to replace advice given to you by your health care provider. Make sure you discuss any questions you have with your health care provider.  Coronavirus (COVID-19) Are you at risk?  Are you at risk for the Coronavirus (COVID-19)?  To be considered HIGH RISK for Coronavirus (COVID-19), you have to meet the following criteria:  . Traveled to [place], [place], [place], [place] or [place]; or in the [place] to [place], [place], [place], or [place]; and have fever, cough, and shortness of breath within the last 2 weeks of travel OR . Been in close contact with a person diagnosed with COVID-19 within the last 2 weeks and  have fever, cough, and shortness of breath . IF YOU DO NOT MEET THESE CRITERIA, YOU ARE CONSIDERED LOW RISK FOR COVID-19.  What to do if you are HIGH RISK for COVID-19?  Marland Kitchen If you are having a medical emergency, call 911. . Seek medical care right away. Before you go to a  doctor's office, urgent care or emergency department, call ahead and tell them about your recent travel, contact with someone diagnosed with COVID-19, and your symptoms. You should receive instructions from your physician's office regarding next steps of care.  . When you arrive at healthcare provider, tell the healthcare staff immediately you have returned from visiting [place], [place], [place], [place] or [place]; or traveled in the [place] to [place], [place], [place], or [place]; in the last two weeks or you have been in close contact with a person diagnosed with COVID-19 in the last 2 weeks.   . Tell the health care staff about your symptoms: fever, cough and shortness of breath. . After you have been seen by a medical provider, you will be either: o Tested for (COVID-19) and discharged home on quarantine except to seek medical care if symptoms worsen, and asked to  - Stay home and avoid contact with others until you get your results (4-5 days)  - Avoid travel on public transportation if possible (such as bus, train, or airplane) or o Sent to the Emergency Department by EMS for evaluation, COVID-19 testing, and possible admission depending on your condition and test results.  What to do if you are LOW RISK for COVID-19?  Reduce your risk of any infection by using the same precautions used for avoiding the common cold or flu:  Marland Kitchen Wash your hands often with soap and warm water for at least 20 seconds.  If soap and water are not readily available, use an alcohol-based hand sanitizer with at least 60% alcohol.  . If coughing or sneezing, cover your mouth and nose by coughing or sneezing into the elbow areas of your shirt or coat, into a tissue or into your sleeve (not your hands). . Avoid shaking hands with others and consider head nods or verbal greetings only. . Avoid touching your eyes, nose, or mouth with unwashed hands.  . Avoid close contact with people who are sick. . Avoid  places or events with large numbers of people in one location, like concerts or sporting events. . Carefully consider travel plans you have or are making. . If you are planning any travel outside or inside the [place], visit the CDC's Travelers' Health webpage for the latest health notices. . If you have some symptoms but not all symptoms, continue to monitor at home and seek medical attention if your symptoms worsen. . If you are having a medical emergency, call 911.   [place] / e-Visit: [email or web address]         [facility]: [facility]: [id number]                   [facility]: [phone]

## 2018-11-05 NOTE — Progress Notes (Signed)
[place] 11+6 wks,measurements c/w dates,crl 60.08 mm,normal ovaries bilat,NB present,NT 1.3 mm,fhr 160 bpm,anterior placenta

## 2018-11-05 NOTE — Progress Notes (Signed)
INITIAL OBSTETRICAL VISIT Patient name: Hailey Thompson MRN [id number]  Date of birth: 18-Sep-1995 Chief Complaint:   Initial Prenatal Visit (nt/it)  History of Present Illness:   Hailey Thompson is a 23 y.o. G30P1021 Caucasian female at [redacted]w[redacted]d by LMP c/w 7wk u/s, with an Estimated Date of Delivery: 05/21/19 being seen today for her initial obstetrical visit.   Her obstetrical history is significant for SAB x 2, term C/S d/t breech, wants RCS  Today she reports doing well. Occ nausea, phenergan helps. .  On synthroid 152mcg for hypothyroidism, last TSH 11.439 on 6/23. On Fe TID for anemia, last hemoglobin 8.2 on 6/23.  Patient's last menstrual period was 08/14/2018. Last pap never. Results were: n/a Review of Systems:   Pertinent items are noted in HPI Denies cramping/contractions, leakage of fluid, vaginal bleeding, abnormal vaginal discharge w/ itching/odor/irritation, headaches, visual changes, shortness of breath, chest pain, abdominal pain, severe nausea/vomiting, or problems with urination or bowel movements unless otherwise stated above.  Pertinent History Reviewed:  Reviewed past medical,surgical, social, obstetrical and family history.  Reviewed problem list, medications and allergies. OB History  Gravida Para Term Preterm AB Living  4 1 1   2 1   SAB TAB Ectopic Multiple Live Births  2       1    # Outcome Date GA Lbr Len/2nd Weight Sex Delivery Anes PTL Lv  4 Current           3 Term 05/05/14 [redacted]w[redacted]d  6 lb 14.7 oz (3.138 kg) M CS-LTranv EPI  LIV     Complications: Breech presentation  2 SAB           1 SAB             Obstetric Comments  Pt has had 2-3 miscarriages in the past.    Physical Assessment:   Vitals:   11/05/18 1221  BP: 108/64  Pulse: 84  Weight: 178 lb (80.7 kg)  Body mass index is 42.92 kg/m.       Physical Examination:  General appearance - well appearing, and in no distress  Mental status - alert, oriented to person, place, and time  Psych:  She has a  normal mood and affect  Skin - warm and dry, normal color, no suspicious lesions noted  Chest - effort normal, all lung fields clear to auscultation bilaterally  Heart - normal rate and regular rhythm  Abdomen - soft, nontender  Extremities:  No swelling or varicosities noted  Pelvic - VULVA: normal appearing vulva with no masses, tenderness or lesions  VAGINA: normal appearing vagina with normal color and discharge, no lesions  CERVIX: normal appearing cervix without discharge or lesions, no CMT  Thin prep pap is done w/ reflex HR HPV cotesting by Guido Sander, SNP  TODAY'S NT [place] 11+6 wks,measurements c/w dates,crl 60.08 mm,normal ovaries bilat,NB present,NT 1.3 mm,fhr 160 bpm,anterior placenta   Results for orders placed or performed in visit on 11/05/18 (from the past 24 hour(s))  POC Urinalysis Dipstick OB   Collection Time: 11/05/18 12:35 PM  Result Value Ref Range   Color, UA     Clarity, UA     Glucose, UA Negative Negative   Bilirubin, UA     Ketones, UA neg    Spec Grav, UA     Blood, UA neg    pH, UA     POC,PROTEIN,UA Negative Negative, Trace, Small (1+), Moderate (2+), Large (3+), 4+   Urobilinogen, UA  Nitrite, UA neg    Leukocytes, UA Negative Negative   Appearance     Odor      Assessment & Plan:  1) Low-Risk Pregnancy G4P1021 at [redacted]w[redacted]d with an Estimated Date of Delivery: 05/21/19   2) Initial OB visit  3) Hypothyroidism> on synthroid 115mcg, check TSH today  4) Anemia> on Fe TID, repeat today  5) Prev c/s for breech> wants RCS  Meds:  Meds ordered this encounter  Medications  . Blood Pressure Monitor MISC    Sig: For regular home bp monitoring during pregnancy    Dispense:  1 each    Refill:  0    Z34.90    Initial labs obtained Continue prenatal vitamins Reviewed n/v relief measures and warning s/s to report Reviewed recommended weight gain based on pre-gravid BMI Encouraged well-balanced diet Genetic Screening discussed: requested, nt/it  and maternit21 Cystic fibrosis, SMA, Fragile X screening discussed requested Ultrasound discussed; fetal survey: requested CCNC completed>PCM not here, form faxed Does not have home bp cuff. Rx faxed to [facility]. Check bp weekly, let us know if >140/90.   Follow-up: Return in about 6 weeks (around 12/17/2018) for LROB, 2nd IT, [id number].   Orders Placed This Encounter  Procedures  . Urine Culture  . US OB Comp + 14 Wk  . Integrated 1  . Obstetric Panel, Including HIV  . Sickle cell screen  . Urinalysis, Routine w reflex microscopic  . Inheritest Core(CF97,SMA,FraX)  . MaterniT 21 plus Core, Blood  . Pain Management Screening Profile (10S)  . TSH  . T4, free  . POC Urinalysis Dipstick OB    Roma Schanz CNM, [facility] 11/05/2018 2:09 PM

## 2018-11-06 ENCOUNTER — Ambulatory Visit: Payer: Medicaid Other | Admitting: *Deleted

## 2018-11-06 ENCOUNTER — Encounter: Payer: Self-pay | Admitting: Women's Health

## 2018-11-06 DIAGNOSIS — O99019 Anemia complicating pregnancy, unspecified trimester: Secondary | ICD-10-CM | POA: Insufficient documentation

## 2018-11-06 LAB — CYTOLOGY - PAP
Chlamydia: NEGATIVE
Diagnosis: NEGATIVE
HPV: NOT DETECTED
Neisseria Gonorrhea: NEGATIVE

## 2018-11-06 LAB — TSH: TSH: 3.26 u[IU]/mL (ref 0.450–4.500)

## 2018-11-06 LAB — T4, FREE: Free T4: 1.79 ng/dL — ABNORMAL HIGH (ref 0.82–1.77)

## 2018-11-08 ENCOUNTER — Other Ambulatory Visit: Payer: Self-pay | Admitting: "Endocrinology

## 2018-11-08 MED ORDER — LEVOTHYROXINE SODIUM 125 MCG PO TABS: 125.0000 ug | ORAL_TABLET | Freq: Every day | ORAL | 3 refills | Status: DC

## 2018-11-13 ENCOUNTER — Other Ambulatory Visit: Payer: Self-pay | Admitting: Women's Health

## 2018-11-13 ENCOUNTER — Telehealth: Payer: Self-pay | Admitting: Advanced Practice Midwife

## 2018-11-13 NOTE — Telephone Encounter (Signed)
Patient called, stated she still has not gotten a bp cuff.  [phone]

## 2018-11-14 ENCOUNTER — Encounter: Payer: Self-pay | Admitting: *Deleted

## 2018-11-22 LAB — OBSTETRIC PANEL, INCLUDING HIV
Antibody Screen: NEGATIVE
Basophils Absolute: 0.1 10*3/uL (ref 0.0–0.2)
Basos: 0 %
EOS (ABSOLUTE): 0.1 10*3/uL (ref 0.0–0.4)
Eos: 1 %
HIV Screen 4th Generation wRfx: NONREACTIVE
Hematocrit: 32 % — ABNORMAL LOW (ref 34.0–46.6)
Hemoglobin: 10.1 g/dL — ABNORMAL LOW (ref 11.1–15.9)
Hepatitis B Surface Ag: NEGATIVE
Immature Grans (Abs): 0 10*3/uL (ref 0.0–0.1)
Immature Granulocytes: 0 %
Lymphocytes Absolute: 1.9 10*3/uL (ref 0.7–3.1)
Lymphs: 15 %
MCH: 24.7 pg — ABNORMAL LOW (ref 26.6–33.0)
MCHC: 31.6 g/dL (ref 31.5–35.7)
MCV: 78 fL — ABNORMAL LOW (ref 79–97)
Monocytes Absolute: 0.4 10*3/uL (ref 0.1–0.9)
Monocytes: 3 %
Neutrophils Absolute: 10 10*3/uL — ABNORMAL HIGH (ref 1.4–7.0)
Neutrophils: 81 %
Platelets: 283 10*3/uL (ref 150–450)
RBC: 4.09 x10E6/uL (ref 3.77–5.28)
RPR Ser Ql: NONREACTIVE
Rh Factor: POSITIVE
Rubella Antibodies, IGG: 2.23 index (ref >0.99)
WBC: 12.5 10*3/uL — ABNORMAL HIGH (ref 3.4–10.8)

## 2018-11-22 LAB — MATERNIT 21 PLUS CORE, BLOOD
Fetal Fraction: 4
Result (T21): NEGATIVE
Trisomy 13 (Patau syndrome): NEGATIVE
Trisomy 18 (Edwards syndrome): NEGATIVE
Trisomy 21 (Down syndrome): NEGATIVE

## 2018-11-22 LAB — INTEGRATED 1
Crown Rump Length: 60.1 mm
Gest. Age on Collection Date: 12.3 weeks
Maternal Age at EDD: 23.9 yr
Nuchal Translucency (NT): 1.3 mm
Number of Fetuses: 1
PAPP-A Value: 327.3 ng/mL
Weight: 178 [lb_av]

## 2018-11-22 LAB — URINALYSIS, ROUTINE W REFLEX MICROSCOPIC
Bilirubin, UA: NEGATIVE
Glucose, UA: NEGATIVE
Ketones, UA: NEGATIVE
Leukocytes,UA: NEGATIVE
Nitrite, UA: NEGATIVE
RBC, UA: NEGATIVE
Specific Gravity, UA: 1.022 (ref 1.005–1.030)
Urobilinogen, Ur: 0.2 mg/dL (ref 0.2–1.0)
pH, UA: 5.5 (ref 5.0–7.5)

## 2018-11-22 LAB — INHERITEST CORE(CF97,SMA,FRAX)

## 2018-11-22 LAB — SICKLE CELL SCREEN: Sickle Cell Screen: NEGATIVE

## 2018-12-17 ENCOUNTER — Encounter: Payer: Self-pay | Admitting: Advanced Practice Midwife

## 2018-12-17 ENCOUNTER — Ambulatory Visit (INDEPENDENT_AMBULATORY_CARE_PROVIDER_SITE_OTHER): Payer: Medicaid Other | Admitting: Advanced Practice Midwife

## 2018-12-17 ENCOUNTER — Other Ambulatory Visit: Payer: Self-pay

## 2018-12-17 ENCOUNTER — Other Ambulatory Visit: Payer: Medicaid Other

## 2018-12-17 VITALS — BP 108/69 | HR 77 | Wt 179.0 lb

## 2018-12-17 DIAGNOSIS — Z3482 Encounter for supervision of other normal pregnancy, second trimester: Secondary | ICD-10-CM

## 2018-12-17 DIAGNOSIS — E039 Hypothyroidism, unspecified: Secondary | ICD-10-CM

## 2018-12-17 DIAGNOSIS — Z1379 Encounter for other screening for genetic and chromosomal anomalies: Secondary | ICD-10-CM

## 2018-12-17 DIAGNOSIS — Z331 Pregnant state, incidental: Secondary | ICD-10-CM

## 2018-12-17 DIAGNOSIS — Z1389 Encounter for screening for other disorder: Secondary | ICD-10-CM

## 2018-12-17 DIAGNOSIS — Z3A17 17 weeks gestation of pregnancy: Secondary | ICD-10-CM

## 2018-12-17 LAB — POCT URINALYSIS DIPSTICK OB
Glucose, UA: NEGATIVE
Leukocytes, UA: NEGATIVE
Nitrite, UA: NEGATIVE
POC,PROTEIN,UA: NEGATIVE

## 2018-12-17 NOTE — Patient Instructions (Addendum)
Hailey Thompson, I greatly value your feedback.  If you receive a survey following your visit with [place] today, we appreciate you taking the time to fill it out.  Thanks, Nigel Berthold, CNM     Second Trimester of Pregnancy The second trimester is from week 14 through week 27 (months 4 through 6). The second trimester is often a time when you feel your best. Your body has adjusted to being pregnant, and you begin to feel better physically. Usually, morning sickness has lessened or quit completely, you may have more energy, and you may have an increase in appetite. The second trimester is also a time when the fetus is growing rapidly. At the end of the sixth month, the fetus is about 9 inches long and weighs about 1 pounds. You will likely begin to feel the baby move (quickening) between 16 and 20 weeks of pregnancy. Body changes during your second trimester Your body continues to go through many changes during your second trimester. The changes vary from woman to woman.  Your weight will continue to increase. You will notice your lower abdomen bulging out.  You may begin to get stretch marks on your hips, abdomen, and breasts.  You may develop headaches that can be relieved by medicines. The medicines should be approved by your health care provider.  You may urinate more often because the fetus is pressing on your bladder.  You may develop or continue to have heartburn as a result of your pregnancy.  You may develop constipation because certain hormones are causing the muscles that push waste through your intestines to slow down.  You may develop hemorrhoids or swollen, bulging veins (varicose veins).  You may have back pain. This is caused by: ? Weight gain. ? Pregnancy hormones that are relaxing the joints in your pelvis. ? A shift in weight and the muscles that support your balance.  Your breasts will continue to grow and they will continue to become tender.  Your gums may bleed  and may be sensitive to brushing and flossing.  Dark spots or blotches (chloasma, mask of pregnancy) may develop on your face. This will likely fade after the baby is born.  A dark line from your belly button to the pubic area (linea nigra) may appear. This will likely fade after the baby is born.  You may have changes in your hair. These can include thickening of your hair, rapid growth, and changes in texture. Some women also have hair loss during or after pregnancy, or hair that feels dry or thin. Your hair will most likely return to normal after your baby is born.  What to expect at prenatal visits During a routine prenatal visit:  You will be weighed to make sure you and the fetus are growing normally.  Your blood pressure will be taken.  Your abdomen will be measured to track your baby's growth.  The fetal heartbeat will be listened to.  Any test results from the previous visit will be discussed.  Your health care provider may ask you:  How you are feeling.  If you are feeling the baby move.  If you have had any abnormal symptoms, such as leaking fluid, bleeding, severe headaches, or abdominal cramping.  If you are using any tobacco products, including cigarettes, chewing tobacco, and electronic cigarettes.  If you have any questions.  Other tests that may be performed during your second trimester include:  Blood tests that check for: ? Low iron levels (anemia). ? High  blood sugar that affects pregnant women (gestational diabetes) between 3 and 28 weeks. ? Rh antibodies. This is to check for a protein on red blood cells (Rh factor).  Urine tests to check for infections, diabetes, or protein in the urine.  An ultrasound to confirm the proper growth and development of the baby.  An amniocentesis to check for possible genetic problems.  Fetal screens for spina bifida and Down syndrome.  HIV (human immunodeficiency virus) testing. Routine prenatal testing includes  screening for HIV, unless you choose not to have this test.  Follow these instructions at home: Medicines  Follow your health care provider's instructions regarding medicine use. Specific medicines may be either safe or unsafe to take during pregnancy.  Take a prenatal vitamin that contains at least 600 micrograms (mcg) of folic acid.  If you develop constipation, try taking a stool softener if your health care provider approves. Eating and drinking  Eat a balanced diet that includes fresh fruits and vegetables, whole grains, good sources of protein such as meat, eggs, or tofu, and low-fat dairy. Your health care provider will help you determine the amount of weight gain that is right for you.  Avoid raw meat and uncooked cheese. These carry germs that can cause birth defects in the baby.  If you have low calcium intake from food, talk to your health care provider about whether you should take a daily calcium supplement.  Limit foods that are high in fat and processed sugars, such as fried and sweet foods.  To prevent constipation: ? Drink enough fluid to keep your urine clear or pale yellow. ? Eat foods that are high in fiber, such as fresh fruits and vegetables, whole grains, and beans. Activity  Exercise only as directed by your health care provider. Most women can continue their usual exercise routine during pregnancy. Try to exercise for 30 minutes at least 5 days a week. Stop exercising if you experience uterine contractions.  Avoid heavy lifting, wear low heel shoes, and practice good posture.  A sexual relationship may be continued unless your health care provider directs you otherwise. Relieving pain and discomfort  Wear a good support bra to prevent discomfort from breast tenderness.  Take warm sitz baths to soothe any pain or discomfort caused by hemorrhoids. Use hemorrhoid cream if your health care provider approves.  Rest with your legs elevated if you have leg cramps  or low back pain.  If you develop varicose veins, wear support hose. Elevate your feet for 15 minutes, 3-4 times a day. Limit salt in your diet. Prenatal Care  Write down your questions. Take them to your prenatal visits.  Keep all your prenatal visits as told by your health care provider. This is important. Safety  Wear your seat belt at all times when driving.  Make a list of emergency phone numbers, including numbers for family, friends, the hospital, and police and fire departments. General instructions  Ask your health care provider for a referral to a local prenatal education class. Begin classes no later than the beginning of month 6 of your pregnancy.  Ask for help if you have counseling or nutritional needs during pregnancy. Your health care provider can offer advice or refer you to specialists for help with various needs.  Do not use hot tubs, steam rooms, or saunas.  Do not douche or use tampons or scented sanitary pads.  Do not cross your legs for long periods of time.  Avoid cat litter boxes and soil  used by cats. These carry germs that can cause birth defects in the baby and possibly loss of the fetus by miscarriage or stillbirth.  Avoid all smoking, herbs, alcohol, and unprescribed drugs. Chemicals in these products can affect the formation and growth of the baby.  Do not use any products that contain nicotine or tobacco, such as cigarettes and e-cigarettes. If you need help quitting, ask your health care provider.  Visit your dentist if you have not gone yet during your pregnancy. Use a soft toothbrush to brush your teeth and be gentle when you floss. Contact a health care provider if:  You have dizziness.  You have mild pelvic cramps, pelvic pressure, or nagging pain in the abdominal area.  You have persistent nausea, vomiting, or diarrhea.  You have a bad smelling vaginal discharge.  You have pain when you urinate. Get help right away if:  You have a  fever.  You are leaking fluid from your vagina.  You have spotting or bleeding from your vagina.  You have severe abdominal cramping or pain.  You have rapid weight gain or weight loss.  You have shortness of breath with chest pain.  You notice sudden or extreme swelling of your face, hands, ankles, feet, or legs.  You have not felt your baby move in over an hour.  You have severe headaches that do not go away when you take medicine.  You have vision changes. Summary  The second trimester is from week 14 through week 27 (months 4 through 6). It is also a time when the fetus is growing rapidly.  Your body goes through many changes during pregnancy. The changes vary from woman to woman.  Avoid all smoking, herbs, alcohol, and unprescribed drugs. These chemicals affect the formation and growth your baby.  Do not use any tobacco products, such as cigarettes, chewing tobacco, and e-cigarettes. If you need help quitting, ask your health care provider.  Contact your health care provider if you have any questions. Keep all prenatal visits as told by your health care provider. This is important. This information is not intended to replace advice given to you by your health care provider. Make sure you discuss any questions you have with your health care provider.      CHILDBIRTH CLASSES [phone] is the phone number for Pregnancy Classes or hospital tours at [place] will be referred to  [email or web address] for more information on childbirth classes  At this site you may register for classes. You may sign up for a waiting list if classes are full. Please SIGN UP FOR THIS!.   When the waiting list becomes long, sometimes new classes can be added.   Round Ligament Pain During Pregnancy   Round ligament pain is a sharp pain or jabbing feeling often felt in the lower belly or  groin area on one or both sides. It is one of the most common complaints during pregnancy and is considered a normal part of pregnancy. It is most often felt during the second trimester.   Here is what you need to know about round ligament pain, including some tips to help you feel better.   Causes of Round Ligament Pain   Several thick ligaments surround and support your womb (uterus) as it grows during pregnancy. One of them is called the round ligament.   The round ligament connects the front part of the womb to your groin, the area where your legs attach to your pelvis.  The round ligament normally tightens and relaxes slowly.   As your baby and womb grow, the round ligament stretches. That makes it more likely to become strained.   Sudden movements can cause the ligament to tighten quickly, like a rubber band snapping. This causes a sudden and quick jabbing feeling.   Symptoms of Round Ligament Pain   Round ligament pain can be concerning and uncomfortable. But it is considered normal as your body changes during pregnancy.   The symptoms of round ligament pain include a sharp, sudden spasm in the belly. It usually affects the right side, but it may happen on both sides. The pain only lasts a few seconds.   Exercise may cause the pain, as will rapid movements such as:  sneezing  coughing  laughing  rolling over in bed  standing up too quickly   Treatment of Round Ligament Pain   Here are some tips that may help reduce your discomfort:   Pain relief. Take over-the-counter acetaminophen for pain, if necessary. Ask your doctor if this is OK.   Exercise. Get plenty of exercise to keep your stomach (core) muscles strong. Doing stretching exercises or prenatal yoga can be helpful. Ask your doctor which exercises are safe for you and your baby.   A helpful exercise involves putting your hands and knees on the floor, lowering your head, and pushing your backside into the air.   Avoid  sudden movements. Change positions slowly (such as standing up or sitting down) to avoid sudden movements that may cause stretching and pain.   Flex your hips. Bend and flex your hips before you cough, sneeze, or laugh to avoid pulling on the ligaments.   Apply warmth. A heating pad or warm bath may be helpful. Ask your doctor if this is OK. Extreme heat can be dangerous to the baby.   You should try to modify your daily activity level and avoid positions that may worsen the condition.   When to Call the Doctor/Midwife   Always tell your doctor or midwife about any type of pain you have during pregnancy. Round ligament pain is quick and doesn't last long.   Call your health care provider immediately if you have:  severe pain  fever  chills  pain on urination  difficulty walking   Belly pain during pregnancy can be due to many different causes. It is important for your doctor to rule out more serious conditions, including pregnancy complications such as placenta abruption or non-pregnancy illnesses such as:  inguinal hernia  appendicitis  stomach, liver, and kidney problems  Preterm labor pains may sometimes be mistaken for round ligament pain.

## 2018-12-17 NOTE — Progress Notes (Signed)
LOW-RISK PREGNANCY VISIT Patient name: Hailey Thompson MRN [id number]  Date of birth: Apr 07, 1995 Chief Complaint:   Routine Prenatal Visit (2nd IT; pain in both sides; sometimes pain is sharp)  History of Present Illness:   Hailey Thompson is a 23 y.o. [phone] female at [redacted]w[redacted]d with an Estimated Date of Delivery: 05/21/19 being seen today for ongoing management of a low-risk pregnancy.  Today she reports no complaints. Contractions: Not present. Vag. Bleeding: None.  Movement: Present. denies leaking of fluid. Having occ sharp pelvic pain, sounds like roundligament Review of Systems:   Pertinent items are noted in HPI Denies abnormal vaginal discharge w/ itching/odor/irritation, headaches, visual changes, shortness of breath, chest pain, abdominal pain, severe nausea/vomiting, or problems with urination or bowel movements unless otherwise stated above.  Pertinent History Reviewed:  Medical & Surgical Hx:   Past Medical History:  Diagnosis Date  . Hypothyroidism   . Migraine   . Thyroid disease   . UTI (lower urinary tract infection)    Past Surgical History:  Procedure Laterality Date  . APPENDECTOMY    . CESAREAN SECTION    . CHOLECYSTECTOMY N/A 11/06/2015   Procedure: LAPAROSCOPIC CHOLECYSTECTOMY;  Surgeon: Vickie Epley, MD;  Location: [facility];  Service: General;  Laterality: N/A;   Family History  Problem Relation Age of Onset  . Short stature Sister        s/p kidney failure  . Kidney disease Sister   . Deafness Sister   . Diabetes Mother        type 1 dx as young child. now with multiple complications  . Thyroid disease Mother   . Deep vein thrombosis Mother   . Cancer Maternal Grandmother        breast  . Cancer Maternal Grandfather   . Short stature Sister        paternal half sister 78'8"    Current Outpatient Medications:  .  Acetaminophen (TYLENOL PO), Take by mouth as needed., Disp: , Rfl:  .  Blood Pressure Monitor MISC, For regular home bp monitoring during  pregnancy, Disp: 1 each, Rfl: 0 .  Cholecalciferol (VITAMIN D3) 125 MCG (5000 UT) CAPS, Take 1 capsule (5,000 Units total) by mouth daily., Disp: 90 capsule, Rfl: 0 .  ferrous sulfate (FERROUSUL) 325 (65 FE) MG tablet, Take 1 tablet (325 mg total) by mouth daily with breakfast., Disp: 90 tablet, Rfl: 3 .  levothyroxine (SYNTHROID) 125 MCG tablet, Take 1 tablet (125 mcg total) by mouth daily before breakfast., Disp: 30 tablet, Rfl: 3 .  Prenatal Vit-Fe Fumarate-FA (PRENATAL VITAMIN PO), Take by mouth daily., Disp: , Rfl:  .  promethazine (PHENERGAN) 25 MG tablet, Take 1 tablet (25 mg total) by mouth every 6 (six) hours as needed for nausea or vomiting., Disp: 30 tablet, Rfl: 1 Social History: Reviewed -  reports that she has never smoked. She has never used smokeless tobacco.  Physical Assessment:   Vitals:   12/17/18 1438  BP: 108/69  Pulse: 77  Weight: 179 lb (81.2 kg)  Body mass index is 43.16 kg/m.        Physical Examination:   General appearance: Well appearing, and in no distress  Mental status: Alert, oriented to person, place, and time  Skin: Warm & dry  Cardiovascular: Normal heart rate noted  Respiratory: Normal respiratory effort, no distress  Abdomen: Soft, gravid, nontender  Pelvic: Cervical exam deferred         Extremities: Edema: Trace  Fetal Status: Fetal Heart  Rate (bpm): 150   Movement: Present    Results for orders placed or performed in visit on 12/17/18 (from the past 24 hour(s))  POC Urinalysis Dipstick OB   Collection Time: 12/17/18  2:39 PM  Result Value Ref Range   Color, UA     Clarity, UA     Glucose, UA Negative Negative   Bilirubin, UA     Ketones, UA 1+    Spec Grav, UA     Blood, UA trace    pH, UA     POC,PROTEIN,UA Negative Negative, Trace, Small (1+), Moderate (2+), Large (3+), 4+   Urobilinogen, UA     Nitrite, UA neg    Leukocytes, UA Negative Negative   Appearance     Odor      Assessment & Plan:  1) Low-risk pregnancy [id number] at  [redacted]w[redacted]d with an Estimated Date of Delivery: 05/21/19      Labs/procedures/US today: 2nd IT, urine cx and UDS  Plan:  Continue routine obstetrical care  Check thyroid panel (Per KB, recheck today). Considering TOLAC (CS d/t breech)  Follow-up: Return for As scheduled;  request H&P/Op report 05/05/14 CS from [facility].  Orders Placed This Encounter  Procedures  . INTEGRATED 2  . Thyroid Panel With TSH  . POC Urinalysis Dipstick OB   Christin Fudge CNM 12/17/2018 3:20 PM

## 2018-12-18 ENCOUNTER — Other Ambulatory Visit: Payer: Self-pay | Admitting: Advanced Practice Midwife

## 2018-12-18 LAB — THYROID PANEL WITH TSH
Free Thyroxine Index: 3.4 (ref 1.2–4.9)
T3 Uptake Ratio: 17 % — ABNORMAL LOW (ref 24–39)
T4, Total: 20.2 ug/dL (ref 4.5–12.0)
TSH: 3.42 u[IU]/mL (ref 0.450–4.500)

## 2018-12-18 MED ORDER — LEVOTHYROXINE SODIUM 150 MCG PO TABS: 150.0000 ug | ORAL_TABLET | Freq: Every day | ORAL | 3 refills | Status: DC

## 2018-12-19 LAB — PMP SCREEN PROFILE (10S), URINE

## 2018-12-20 LAB — INTEGRATED 2
AFP MoM: 1.01
Alpha-Fetoprotein: 38 ng/mL
Crown Rump Length: 60.1 mm
DIA MoM: 1.28
DIA Value: 191.1 pg/mL
Estriol, Unconjugated: 2.27 ng/mL
Gest. Age on Collection Date: 12.3 weeks
Gestational Age: 18.3 weeks
Maternal Age at EDD: 23.9 yr
Nuchal Translucency (NT): 1.3 mm
Nuchal Translucency MoM: 0.97
Number of Fetuses: 1
PAPP-A MoM: 0.41
PAPP-A Value: 327.3 ng/mL
Test Results:: NEGATIVE
Weight: 178 [lb_av]
Weight: 179 [lb_av]
hCG MoM: 2.19
hCG Value: 47.9 IU/mL
uE3 MoM: 1.54

## 2018-12-21 LAB — URINE CULTURE

## 2018-12-24 ENCOUNTER — Other Ambulatory Visit: Payer: Self-pay

## 2018-12-24 ENCOUNTER — Ambulatory Visit (INDEPENDENT_AMBULATORY_CARE_PROVIDER_SITE_OTHER): Payer: Medicaid Other

## 2018-12-24 DIAGNOSIS — Z3A18 18 weeks gestation of pregnancy: Secondary | ICD-10-CM

## 2018-12-24 DIAGNOSIS — Z3481 Encounter for supervision of other normal pregnancy, first trimester: Secondary | ICD-10-CM

## 2018-12-24 DIAGNOSIS — Z363 Encounter for antenatal screening for malformations: Secondary | ICD-10-CM

## 2018-12-24 NOTE — Progress Notes (Signed)
[place] [id number] wks,cephalic,anterior placenta gr 0,normal ovaries bilat,svp of fluid 4.8 cm,fhr 159 bpm,cx 4.6 cm,efw 267 g 52%,anatomy complete,no obvious abnormalities

## 2019-01-04 ENCOUNTER — Telehealth: Payer: Self-pay | Admitting: *Deleted

## 2019-01-04 NOTE — Telephone Encounter (Signed)
Patient with complaints of pain and pressure.

## 2019-01-04 NOTE — Telephone Encounter (Signed)
Patient states last night and this morning she had diarrhea and now has some pressure in her vaginal area and some mild cramping.  Denies bleeding.  Informed patient she is most likely having cramping due to having the diarrhea.  Encouraged to push fluids and rest today but to let us know or go [facility] if she has an increase in pressure, any bleeding or notes more cramping.  Pt verbalized understanding.

## 2019-01-21 ENCOUNTER — Other Ambulatory Visit: Payer: Self-pay

## 2019-01-21 ENCOUNTER — Encounter: Payer: Self-pay | Admitting: Obstetrics & Gynecology

## 2019-01-21 ENCOUNTER — Ambulatory Visit (INDEPENDENT_AMBULATORY_CARE_PROVIDER_SITE_OTHER): Payer: Medicaid Other | Admitting: Obstetrics & Gynecology

## 2019-01-21 VITALS — BP 102/69 | HR 85 | Wt 183.0 lb

## 2019-01-21 DIAGNOSIS — Z3482 Encounter for supervision of other normal pregnancy, second trimester: Secondary | ICD-10-CM

## 2019-01-21 DIAGNOSIS — Z3A22 22 weeks gestation of pregnancy: Secondary | ICD-10-CM

## 2019-01-21 NOTE — Progress Notes (Signed)
   LOW-RISK PREGNANCY VISIT Patient name: Hailey Thompson MRN [id number]  Date of birth: 02/10/1996 Chief Complaint:   Routine Prenatal Visit  History of Present Illness:   Hailey Thompson is a 23 y.o. [phone] female at [redacted]w[redacted]d with an Estimated Date of Delivery: 05/21/19 being seen today for ongoing management of a low-risk pregnancy.  Today she reports no problems. Contractions: Not present. Vag. Bleeding: None.  Movement: Present. denies leaking of fluid. Review of Systems:   Pertinent items are noted in HPI Denies abnormal vaginal discharge w/ itching/odor/irritation, headaches, visual changes, shortness of breath, chest pain, abdominal pain, severe nausea/vomiting, or problems with urination or bowel movements unless otherwise stated above. Pertinent History Reviewed:  Reviewed past medical,surgical, social, obstetrical and family history.  Reviewed problem list, medications and allergies. Physical Assessment:   Vitals:   01/21/19 1350  BP: 102/69  Pulse: 85  Weight: 183 lb (83 kg)  Body mass index is 44.12 kg/m.        Physical Examination:   General appearance: Well appearing, and in no distress  Mental status: Alert, oriented to person, place, and time  Skin: Warm & dry  Cardiovascular: Normal heart rate noted  Respiratory: Normal respiratory effort, no distress  Abdomen: Soft, gravid, nontender  Pelvic: Cervical exam deferred         Extremities: Edema: Trace  Fetal Status: Fetal Heart Rate (bpm): 135 Fundal Height: 26 cm Movement: Present    Chaperone: n/a    No results found for this or any previous visit (from the past 24 hour(s)).  Assessment & Plan:  1) Low-risk pregnancy [id number] at [redacted]w[redacted]d with an Estimated Date of Delivery: 05/21/19   2)Previous C section due to breech   Meds: No orders of the defined types were placed in this encounter.  Labs/procedures today:   Plan:  Continue routine obstetrical care PN2 next visit Next visit: prefers in person    Reviewed:  Preterm labor symptoms and general obstetric precautions including but not limited to vaginal bleeding, contractions, leaking of fluid and fetal movement were reviewed in detail with the patient.  All questions were answered. Has home bp cuff. Rx faxed to . Check bp weekly, let us know if >140/90.   Follow-up: Return in about 4 weeks (around 02/18/2019) for PN2, LROB.  No orders of the defined types were placed in this encounter.  Florian Buff  01/21/2019 2:27 PM

## 2019-01-28 ENCOUNTER — Ambulatory Visit: Payer: Medicaid Other | Admitting: "Endocrinology

## 2019-02-01 ENCOUNTER — Telehealth: Payer: Self-pay | Admitting: *Deleted

## 2019-02-01 NOTE — Telephone Encounter (Signed)
Pt states that she has questions about the glucose test. She wants to know if there is any alternate way to take the test besides drinking the glucose drink. A friend told her that she used jelly beans. Advised I would ask a provider and let her know.

## 2019-02-04 NOTE — Telephone Encounter (Signed)
mychart message to patient. 

## 2019-02-18 ENCOUNTER — Other Ambulatory Visit: Payer: Medicaid Other

## 2019-02-18 ENCOUNTER — Ambulatory Visit (INDEPENDENT_AMBULATORY_CARE_PROVIDER_SITE_OTHER): Payer: Medicaid Other | Admitting: Women's Health

## 2019-02-18 ENCOUNTER — Encounter: Payer: Self-pay | Admitting: Women's Health

## 2019-02-18 ENCOUNTER — Other Ambulatory Visit: Payer: Self-pay

## 2019-02-18 VITALS — BP 102/57 | HR 90 | Wt 186.0 lb

## 2019-02-18 DIAGNOSIS — Z331 Pregnant state, incidental: Secondary | ICD-10-CM

## 2019-02-18 DIAGNOSIS — E039 Hypothyroidism, unspecified: Secondary | ICD-10-CM

## 2019-02-18 DIAGNOSIS — Z3402 Encounter for supervision of normal first pregnancy, second trimester: Secondary | ICD-10-CM

## 2019-02-18 DIAGNOSIS — Z3482 Encounter for supervision of other normal pregnancy, second trimester: Secondary | ICD-10-CM

## 2019-02-18 DIAGNOSIS — Z1389 Encounter for screening for other disorder: Secondary | ICD-10-CM

## 2019-02-18 DIAGNOSIS — Z3A26 26 weeks gestation of pregnancy: Secondary | ICD-10-CM

## 2019-02-18 LAB — POCT URINALYSIS DIPSTICK OB
Blood, UA: NEGATIVE
Glucose, UA: NEGATIVE
Ketones, UA: NEGATIVE
Leukocytes, UA: NEGATIVE
Nitrite, UA: NEGATIVE

## 2019-02-18 NOTE — Progress Notes (Signed)
   LOW-RISK PREGNANCY VISIT Patient name: Hailey Thompson MRN [id number]  Date of birth: 12-13-1995 Chief Complaint:   Routine Prenatal Visit  History of Present Illness:   Hailey Thompson is a 23 y.o. [id number] female at [redacted]w[redacted]d with an Estimated Date of Delivery: 05/21/19 being seen today for ongoing management of a low-risk pregnancy.  Today she reports no complaints. Contractions: Irregular. Vag. Bleeding: None.  Movement: Present. denies leaking of fluid. Review of Systems:   Pertinent items are noted in HPI Denies abnormal vaginal discharge w/ itching/odor/irritation, headaches, visual changes, shortness of breath, chest pain, abdominal pain, severe nausea/vomiting, or problems with urination or bowel movements unless otherwise stated above. Pertinent History Reviewed:  Reviewed past medical,surgical, social, obstetrical and family history.  Reviewed problem list, medications and allergies. Physical Assessment:   Vitals:   02/18/19 0928  BP: (!) 102/57  Pulse: 90  Weight: 186 lb (84.4 kg)  Body mass index is 44.85 kg/m.        Physical Examination:   General appearance: Well appearing, and in no distress  Mental status: Alert, oriented to person, place, and time  Skin: Warm & dry  Cardiovascular: Normal heart rate noted  Respiratory: Normal respiratory effort, no distress  Abdomen: Soft, gravid, nontender  Pelvic: Cervical exam deferred         Extremities:    Fetal Status:     Movement: Present    Chaperone: n/a    Results for orders placed or performed in visit on 02/18/19 (from the past 24 hour(s))  POC Urinalysis Dipstick OB   Collection Time: 02/18/19  9:58 AM  Result Value Ref Range   Color, UA     Clarity, UA     Glucose, UA Negative Negative   Bilirubin, UA     Ketones, UA n    Spec Grav, UA     Blood, UA n    pH, UA     POC,PROTEIN,UA Trace Negative, Trace, Small (1+), Moderate (2+), Large (3+), 4+   Urobilinogen, UA     Nitrite, UA n    Leukocytes, UA Negative  Negative   Appearance     Odor      Assessment & Plan:  1) Low-risk pregnancy [id number] at [redacted]w[redacted]d with an Estimated Date of Delivery: 05/21/19   2) Prev c/s, wants RCS  3) Hypothyroidism> on synthroid 135mcg, recheck TSH today   Meds: No orders of the defined types were placed in this encounter.  Labs/procedures today: pn2, declines flu/tdap today, wants next visit  Plan:  Continue routine obstetrical care  Next visit: prefers in person    Reviewed: Preterm labor symptoms and general obstetric precautions including but not limited to vaginal bleeding, contractions, leaking of fluid and fetal movement were reviewed in detail with the patient.  All questions were answered.  Follow-up: Return in about 4 weeks (around 03/18/2019) for [place], in person, CNM.  Orders Placed This Encounter  Procedures  . TSH  . POC Urinalysis Dipstick OB   Roma Schanz CNM, [facility] 02/18/2019 10:17 AM

## 2019-02-18 NOTE — Patient Instructions (Signed)
Hailey Thompson, I greatly value your feedback.  If you receive a survey following your visit with [place] today, we appreciate you taking the time to fill it out.  Thanks, [name], CNM, [facility]  [place]!!! It is now [place] at [facility] ([place], [place] [address]) Entrance located off of [address] parking    Go to [email or web address] to register for FREE online childbirth classes   Call the office [phone]) or go to [facility] if:  You begin to have strong, frequent contractions  Your water breaks.  Sometimes it is a big gush of fluid, sometimes it is just a trickle that keeps getting your panties wet or running down your legs  You have vaginal bleeding.  It is normal to have a small amount of spotting if your cervix was checked.   You don't feel your baby moving like normal.  If you don't, get you something to eat and drink and lay down and focus on feeling your baby move.  You should feel at least 10 movements in 2 hours.  If you don't, you should call the office or go to [facility].    Tdap Vaccine  It is recommended that you get the Tdap vaccine during the third trimester of EACH pregnancy to help protect your baby from getting pertussis (whooping cough)  27-36 weeks is the BEST time to do this so that you can pass the protection on to your baby. During pregnancy is better than after pregnancy, but if you are unable to get it during pregnancy it will be offered at the hospital.   You can get this vaccine with [place], at the health department, your family doctor, or some local pharmacies  Everyone who will be around your baby should also be up-to-date on their vaccines before the baby comes. Adults (who are not pregnant) only need 1 dose of Tdap during adulthood.   [place] Pediatricians/Family Doctors:  [facility] [phone]                  [place] [phone] (usually not accepting new patients unless you have family there already, you are always welcome to call and ask)       [facility] Department [phone]       [facility] Pediatricians/Family Doctors:   [facility]: [phone]  [facility]: [phone]  [facility]: [place] Doctors:   [facility]: [facility]: [place]:  [place]: [phone]   Home Blood Pressure Monitoring for Patients   Your provider has recommended that you check your blood pressure (BP) at least once a week at home. If you do not have a blood pressure cuff at home, one will be provided for you. Contact your provider if you have not received your monitor within 1 week.   Helpful Tips for Accurate Home Blood Pressure Checks  . Don't smoke, exercise, or drink caffeine 30 minutes before checking your BP . Use the restroom before checking your BP (a full bladder can raise your pressure) . Relax in a comfortable upright chair . Feet on the ground . Left arm resting comfortably on a flat surface at the level of your heart . Legs uncrossed . Back supported . Sit quietly and don't talk .  Place the cuff on your bare arm . Adjust snuggly, so that only two fingertips can fit between your skin and the top of the cuff . Check 2 readings separated by at least one minute . Keep a log of your BP readings . For a visual, please reference this diagram: http://ccnc.care/bpdiagram  Provider Name: [facility]     Phone: [phone]  Zone 1: ALL CLEAR  Continue to monitor your symptoms:  . BP reading is less than 140 (top number) or less than 90 (bottom number)  . No right upper stomach pain . No headaches or seeing spots . No feeling nauseated or throwing up . No swelling in face and  hands  Zone 2: CAUTION Call your doctor's office for any of the following:  . BP reading is greater than 140 (top number) or greater than 90 (bottom number)  . Stomach pain under your ribs in the middle or right side . Headaches or seeing spots . Feeling nauseated or throwing up . Swelling in face and hands  Zone 3: EMERGENCY  Seek immediate medical care if you have any of the following:  . BP reading is greater than160 (top number) or greater than 110 (bottom number) . Severe headaches not improving with Tylenol . Serious difficulty catching your breath . Any worsening symptoms from Zone 2   Third Trimester of Pregnancy The third trimester is from week 29 through week 42, months 7 through 9. The third trimester is a time when the fetus is growing rapidly. At the end of the ninth month, the fetus is about 20 inches in length and weighs 6-10 pounds.  BODY CHANGES Your body goes through many changes during pregnancy. The changes vary from woman to woman.   Your weight will continue to increase. You can expect to gain 25-35 pounds (11-16 kg) by the end of the pregnancy.  You may begin to get stretch marks on your hips, abdomen, and breasts.  You may urinate more often because the fetus is moving lower into your pelvis and pressing on your bladder.  You may develop or continue to have heartburn as a result of your pregnancy.  You may develop constipation because certain hormones are causing the muscles that push waste through your intestines to slow down.  You may develop hemorrhoids or swollen, bulging veins (varicose veins).  You may have pelvic pain because of the weight gain and pregnancy hormones relaxing your joints between the bones in your pelvis. Backaches may result from overexertion of the muscles supporting your posture.  You may have changes in your hair. These can include thickening of your hair, rapid growth, and changes in texture. Some women also have hair loss during  or after pregnancy, or hair that feels dry or thin. Your hair will most likely return to normal after your baby is born.  Your breasts will continue to grow and be tender. A yellow discharge may leak from your breasts called colostrum.  Your belly button may stick out.  You may feel short of breath because of your expanding uterus.  You may notice the fetus "dropping," or moving lower in your abdomen.  You may have a bloody mucus discharge. This usually occurs a few days to a week before labor begins.  Your cervix becomes thin and soft (effaced) near your due date. WHAT TO EXPECT AT YOUR PRENATAL EXAMS  You will have prenatal exams every 2 weeks until week 36. Then, you will have weekly prenatal exams. During  a routine prenatal visit:  You will be weighed to make sure you and the fetus are growing normally.  Your blood pressure is taken.  Your abdomen will be measured to track your baby's growth.  The fetal heartbeat will be listened to.  Any test results from the previous visit will be discussed.  You may have a cervical check near your due date to see if you have effaced. At around 36 weeks, your caregiver will check your cervix. At the same time, your caregiver will also perform a test on the secretions of the vaginal tissue. This test is to determine if a type of bacteria, Group B streptococcus, is present. Your caregiver will explain this further. Your caregiver may ask you:  What your birth plan is.  How you are feeling.  If you are feeling the baby move.  If you have had any abnormal symptoms, such as leaking fluid, bleeding, severe headaches, or abdominal cramping.  If you have any questions. Other tests or screenings that may be performed during your third trimester include:  Blood tests that check for low iron levels (anemia).  Fetal testing to check the health, activity level, and growth of the fetus. Testing is done if you have certain medical conditions or if  there are problems during the pregnancy. FALSE LABOR You may feel small, irregular contractions that eventually go away. These are called Braxton Hicks contractions, or false labor. Contractions may last for hours, days, or even weeks before true labor sets in. If contractions come at regular intervals, intensify, or become painful, it is best to be seen by your caregiver.  SIGNS OF LABOR   Menstrual-like cramps.  Contractions that are 5 minutes apart or less.  Contractions that start on the top of the uterus and spread down to the lower abdomen and back.  A sense of increased pelvic pressure or back pain.  A watery or bloody mucus discharge that comes from the vagina. If you have any of these signs before the 37th week of pregnancy, call your caregiver right away. You need to go to the hospital to get checked immediately. HOME CARE INSTRUCTIONS   Avoid all smoking, herbs, alcohol, and unprescribed drugs. These chemicals affect the formation and growth of the baby.  Follow your caregiver's instructions regarding medicine use. There are medicines that are either safe or unsafe to take during pregnancy.  Exercise only as directed by your caregiver. Experiencing uterine cramps is a good sign to stop exercising.  Continue to eat regular, healthy meals.  Wear a good support bra for breast tenderness.  Do not use hot tubs, steam rooms, or saunas.  Wear your seat belt at all times when driving.  Avoid raw meat, uncooked cheese, cat litter boxes, and soil used by cats. These carry germs that can cause birth defects in the baby.  Take your prenatal vitamins.  Try taking a stool softener (if your caregiver approves) if you develop constipation. Eat more high-fiber foods, such as fresh vegetables or fruit and whole grains. Drink plenty of fluids to keep your urine clear or pale yellow.  Take warm sitz baths to soothe any pain or discomfort caused by hemorrhoids. Use hemorrhoid cream if your  caregiver approves.  If you develop varicose veins, wear support hose. Elevate your feet for 15 minutes, 3-4 times a day. Limit salt in your diet.  Avoid heavy lifting, wear low heal shoes, and practice good posture.  Rest a lot with your legs elevated if you  have leg cramps or low back pain.  Visit your dentist if you have not gone during your pregnancy. Use a soft toothbrush to brush your teeth and be gentle when you floss.  A sexual relationship may be continued unless your caregiver directs you otherwise.  Do not travel far distances unless it is absolutely necessary and only with the approval of your caregiver.  Take prenatal classes to understand, practice, and ask questions about the labor and delivery.  Make a trial run to the hospital.  Pack your hospital bag.  Prepare the baby's nursery.  Continue to go to all your prenatal visits as directed by your caregiver. SEEK MEDICAL CARE IF:  You are unsure if you are in labor or if your water has broken.  You have dizziness.  You have mild pelvic cramps, pelvic pressure, or nagging pain in your abdominal area.  You have persistent nausea, vomiting, or diarrhea.  You have a bad smelling vaginal discharge.  You have pain with urination. SEEK IMMEDIATE MEDICAL CARE IF:   You have a fever.  You are leaking fluid from your vagina.  You have spotting or bleeding from your vagina.  You have severe abdominal cramping or pain.  You have rapid weight loss or gain.  You have shortness of breath with chest pain.  You notice sudden or extreme swelling of your face, hands, ankles, feet, or legs.  You have not felt your baby move in over an hour.  You have severe headaches that do not go away with medicine.  You have vision changes. Document Released: 03/08/2001 Document Revised: 03/19/2013 Document Reviewed: 05/15/2012 [facility] Patient Information 2015 [place], [place]. This information is not intended to replace advice  given to you by your health care provider. Make sure you discuss any questions you have with your health care provider.  PROTECT YOURSELF & YOUR BABY FROM THE FLU! Because you are pregnant, we at [facility], along with the Centers for Disease Control (CDC), recommend that you receive the flu vaccine to protect yourself and your baby from the flu. The flu is more likely to cause severe illness in pregnant women than in women of reproductive age who are not pregnant. Changes in the immune system, heart, and lungs during pregnancy make pregnant women (and women up to two weeks postpartum) more prone to severe illness from flu, including illness resulting in hospitalization. Flu also may be harmful for a pregnant woman's developing baby. A common flu symptom is fever, which may be associated with neural tube defects and other adverse outcomes for a developing baby. Getting vaccinated can also help protect a baby after birth from flu. (Mom passes antibodies onto the developing baby during her pregnancy.)  A Flu Vaccine is the Best Protection Against Flu Getting a flu vaccine is the first and most important step in protecting against flu. Pregnant women should get a flu shot and not the live attenuated influenza vaccine (LAIV), also known as nasal spray flu vaccine. Flu vaccines given during pregnancy help protect both the mother and her baby from flu. Vaccination has been shown to reduce the risk of flu-associated acute respiratory infection in pregnant women by up to one-half. A 2018 study showed that getting a flu shot reduced a pregnant woman's risk of being hospitalized with flu by an average of 40 percent. Pregnant women who get a flu vaccine are also helping to protect their babies from flu illness for the first several months after their birth, when they are too  young to get vaccinated.   A Long Record of Safety for Flu Shots in Pregnant Women Flu shots have been given to millions of pregnant women over  many years with a good safety record. There is a lot of evidence that flu vaccines can be given safely during pregnancy; though these data are limited for the first trimester. The CDC recommends that pregnant women get vaccinated during any trimester of their pregnancy. It is very important for pregnant women to get the flu shot.   Other Preventive Actions In addition to getting a flu shot, pregnant women should take the same everyday preventive actions the CDC recommends of everyone, including covering coughs, washing hands often, and avoiding people who are sick.  Symptoms and Treatment If you get sick with flu symptoms call your doctor right away. There are antiviral drugs that can treat flu illness and prevent serious flu complications. The CDC recommends prompt treatment for people who have influenza infection or suspected influenza infection and who are at high risk of serious flu complications, such as people with asthma, diabetes (including gestational diabetes), or heart disease. Early treatment of influenza in hospitalized pregnant women has been shown to reduce the length of the hospital stay.  Symptoms Flu symptoms include fever, cough, sore throat, runny or stuffy nose, body aches, headache, chills and fatigue. Some people may also have vomiting and diarrhea. People may be infected with the flu and have respiratory symptoms without a fever.  Early Treatment is Important for Pregnant Women Treatment should begin as soon as possible because antiviral drugs work best when started early (within 48 hours after symptoms start). Antiviral drugs can make your flu illness milder and make you feel better faster. They may also prevent serious health problems that can result from flu illness. Oral oseltamivir (Tamiflu) is the preferred treatment for pregnant women because it has the most studies available to suggest that it is safe and beneficial. Antiviral drugs require a prescription from your  provider. Having a fever caused by flu infection or other infections early in pregnancy may be linked to birth defects in a baby. In addition to taking antiviral drugs, pregnant women who get a fever should treat their fever with Tylenol (acetaminophen) and contact their provider immediately.  When to [place] If you are pregnant and have any of these signs, seek care immediately:  Difficulty breathing or shortness of breath  Pain or pressure in the chest or abdomen  Sudden dizziness  Confusion  Severe or persistent vomiting  High fever that is not responding to Tylenol (or store brand equivalent)  Decreased or no movement of your baby  [email or web address]

## 2019-02-19 LAB — CBC
Hematocrit: 36 % (ref 34.0–46.6)
Hemoglobin: 11.9 g/dL (ref 11.1–15.9)
MCH: 29.4 pg (ref 26.6–33.0)
MCHC: 33.1 g/dL (ref 31.5–35.7)
MCV: 89 fL (ref 79–97)
Platelets: 196 10*3/uL (ref 150–450)
RBC: 4.05 x10E6/uL (ref 3.77–5.28)
RDW: 14.5 % (ref 11.7–15.4)
WBC: 10.8 10*3/uL (ref 3.4–10.8)

## 2019-02-19 LAB — GLUCOSE TOLERANCE, 2 HOURS W/ 1HR
Glucose, 1 hour: 143 mg/dL (ref 65–179)
Glucose, 2 hour: 102 mg/dL (ref 65–152)
Glucose, Fasting: 74 mg/dL (ref 65–91)

## 2019-02-19 LAB — ANTIBODY SCREEN: Antibody Screen: NEGATIVE

## 2019-02-19 LAB — TSH: TSH: 2.49 u[IU]/mL (ref 0.450–4.500)

## 2019-02-19 LAB — HIV ANTIBODY (ROUTINE TESTING W REFLEX): HIV Screen 4th Generation wRfx: NONREACTIVE

## 2019-02-19 LAB — RPR: RPR Ser Ql: NONREACTIVE

## 2019-03-14 ENCOUNTER — Other Ambulatory Visit: Payer: Self-pay | Admitting: Advanced Practice Midwife

## 2019-03-14 MED ORDER — LEVOTHYROXINE SODIUM 150 MCG PO TABS: 150.0000 ug | ORAL_TABLET | Freq: Every day | ORAL | 3 refills | Status: DC

## 2019-03-15 ENCOUNTER — Encounter: Payer: Self-pay | Admitting: Advanced Practice Midwife

## 2019-03-18 ENCOUNTER — Encounter: Payer: Self-pay | Admitting: Women's Health

## 2019-03-18 ENCOUNTER — Other Ambulatory Visit: Payer: Self-pay

## 2019-03-18 ENCOUNTER — Ambulatory Visit (INDEPENDENT_AMBULATORY_CARE_PROVIDER_SITE_OTHER): Payer: Medicaid Other | Admitting: Women's Health

## 2019-03-18 VITALS — BP 114/71 | HR 116 | Wt 188.2 lb

## 2019-03-18 DIAGNOSIS — Z1389 Encounter for screening for other disorder: Secondary | ICD-10-CM

## 2019-03-18 DIAGNOSIS — Z3A30 30 weeks gestation of pregnancy: Secondary | ICD-10-CM

## 2019-03-18 DIAGNOSIS — Z331 Pregnant state, incidental: Secondary | ICD-10-CM

## 2019-03-18 DIAGNOSIS — Z23 Encounter for immunization: Secondary | ICD-10-CM

## 2019-03-18 DIAGNOSIS — Z3483 Encounter for supervision of other normal pregnancy, third trimester: Secondary | ICD-10-CM

## 2019-03-18 LAB — POCT URINALYSIS DIPSTICK OB
Glucose, UA: NEGATIVE
Ketones, UA: NEGATIVE
Leukocytes, UA: NEGATIVE
Nitrite, UA: NEGATIVE

## 2019-03-18 NOTE — Progress Notes (Signed)
   LOW-RISK PREGNANCY VISIT Patient name: Hailey Thompson MRN [id number]  Date of birth: September 10, 1995 Chief Complaint:   Routine Prenatal Visit  History of Present Illness:   Hailey Thompson is a 23 y.o. [phone] female at 108w6d with an Estimated Date of Delivery: 05/21/19 being seen today for ongoing management of a low-risk pregnancy.  Today she reports itchy rash on hands worse w/ hot water, lotion. Contractions: Irregular. Vag. Bleeding: None.  Movement: Present. denies leaking of fluid. Review of Systems:   Pertinent items are noted in HPI Denies abnormal vaginal discharge w/ itching/odor/irritation, headaches, visual changes, shortness of breath, chest pain, abdominal pain, severe nausea/vomiting, or problems with urination or bowel movements unless otherwise stated above. Pertinent History Reviewed:  Reviewed past medical,surgical, social, obstetrical and family history.  Reviewed problem list, medications and allergies. Physical Assessment:   Vitals:   03/18/19 1037  BP: 114/71  Pulse: (!) 116  Weight: 188 lb 3.2 oz (85.4 kg)  Body mass index is 45.38 kg/m.        Physical Examination:   General appearance: Well appearing, and in no distress  Mental status: Alert, oriented to person, place, and time  Skin: Warm & dry, fine red rash hands  Cardiovascular: Normal heart rate noted  Respiratory: Normal respiratory effort, no distress  Abdomen: Soft, gravid, nontender  Pelvic: Cervical exam deferred         Extremities: Edema: Trace  Fetal Status: Fetal Heart Rate (bpm): 139 Fundal Height: 31 cm Movement: Present    Chaperone: n/a    Results for orders placed or performed in visit on 03/18/19 (from the past 24 hour(s))  POC Urinalysis Dipstick OB   Collection Time: 03/18/19 10:49 AM  Result Value Ref Range   Color, UA     Clarity, UA     Glucose, UA Negative Negative   Bilirubin, UA     Ketones, UA n    Spec Grav, UA     Blood, UA trace    pH, UA     POC,PROTEIN,UA Moderate  (2+) Negative, Trace, Small (1+), Moderate (2+), Large (3+), 4+   Urobilinogen, UA     Nitrite, UA n    Leukocytes, UA Negative Negative   Appearance     Odor      Assessment & Plan:  1) Low-risk pregnancy [id number] at [redacted]w[redacted]d with an Estimated Date of Delivery: 05/21/19   2) Prev c/s, for RCS w/ JVF  3) Itchy rash hands> looks like d/t dry skin, try Eucerin, if doesn't help try hydrocortisone   Meds: No orders of the defined types were placed in this encounter.  Labs/procedures today: none  Plan:  Continue routine obstetrical care  Next visit: prefers in person    Reviewed: Preterm labor symptoms and general obstetric precautions including but not limited to vaginal bleeding, contractions, leaking of fluid and fetal movement were reviewed in detail with the patient.  All questions were answered. Has home bp cuff. Check bp weekly, let us know if >140/90.   Follow-up: Return in about 2 weeks (around 04/01/2019) for [place], [place], in person.  Orders Placed This Encounter  Procedures  . Flu Vaccine QUAD 36+ mos IM  . Tdap vaccine greater than or equal to 7yo IM  . POC Urinalysis Dipstick OB   Roma Schanz CNM, [facility] 03/18/2019 11:08 AM

## 2019-03-18 NOTE — Patient Instructions (Signed)
[name], I greatly value your feedback.  If you receive a survey following your visit with [place] today, we appreciate you taking the time to fill it out.  Thanks, [name], CNM, [facility]  [place]!!! It is now [place] at [facility] ([place], [place] [address]) Entrance located off of [address] parking   Go to [email or web address] to register for FREE online childbirth classes    Call the office [phone]) or go to [facility] if:  You begin to have strong, frequent contractions  Your water breaks.  Sometimes it is a big gush of fluid, sometimes it is just a trickle that keeps getting your panties wet or running down your legs  You have vaginal bleeding.  It is normal to have a small amount of spotting if your cervix was checked.   You don't feel your baby moving like normal.  If you don't, get you something to eat and drink and lay down and focus on feeling your baby move.  You should feel at least 10 movements in 2 hours.  If you don't, you should call the office or go to [place] Blood Pressure Monitoring for Patients   Your provider has recommended that you check your blood pressure (BP) at least once a week at home. If you do not have a blood pressure cuff at home, one will be provided for you. Contact your provider if you have not received your monitor within 1 week.   Helpful Tips for Accurate Home Blood Pressure Checks  . Don't smoke, exercise, or drink caffeine 30 minutes before checking your BP . Use the restroom before checking your BP (a full bladder can raise your pressure) . Relax in a comfortable upright chair . Feet on the ground . Left arm resting comfortably on a flat surface at the level of your heart . Legs uncrossed . Back supported . Sit quietly and don't talk . Place the cuff on your bare arm . Adjust snuggly, so that only two fingertips can fit between your skin and  the top of the cuff . Check 2 readings separated by at least one minute . Keep a log of your BP readings . For a visual, please reference this diagram: http://ccnc.care/bpdiagram  Provider Name: [facility]     Phone: [phone]  Zone 1: ALL CLEAR  Continue to monitor your symptoms:  . BP reading is less than 140 (top number) or less than 90 (bottom number)  . No right upper stomach pain . No headaches or seeing spots . No feeling nauseated or throwing up . No swelling in face and hands  Zone 2: CAUTION Call your doctor's office for any of the following:  . BP reading is greater than 140 (top number) or greater than 90 (bottom number)  . Stomach pain under your ribs in the middle or right side . Headaches or seeing spots . Feeling nauseated or throwing up . Swelling in face and hands  Zone 3: EMERGENCY  Seek immediate medical care if you have any of the following:  . BP reading is greater than160 (top number) or greater than 110 (bottom number) . Severe headaches not improving with Tylenol . Serious difficulty catching your breath . Any worsening symptoms from Zone 2    Preterm Labor and Birth Information  The normal length of a pregnancy is 39-41 weeks. Preterm labor is when labor starts before  37 completed weeks of pregnancy. What are the risk factors for preterm labor? Preterm labor is more likely to occur in women who:  Have certain infections during pregnancy such as a bladder infection, sexually transmitted infection, or infection inside the uterus (chorioamnionitis).  Have a shorter-than-normal cervix.  Have gone into preterm labor before.  Have had surgery on their cervix.  Are younger than age 62 or older than age 39.  Are African American.  Are pregnant with twins or multiple babies (multiple gestation).  Take street drugs or smoke while pregnant.  Do not gain enough weight while pregnant.  Became pregnant shortly after having been  pregnant. What are the symptoms of preterm labor? Symptoms of preterm labor include:  Cramps similar to those that can happen during a menstrual period. The cramps may happen with diarrhea.  Pain in the abdomen or lower back.  Regular uterine contractions that may feel like tightening of the abdomen.  A feeling of increased pressure in the pelvis.  Increased watery or bloody mucus discharge from the vagina.  Water breaking (ruptured amniotic sac). Why is it important to recognize signs of preterm labor? It is important to recognize signs of preterm labor because babies who are born prematurely may not be fully developed. This can put them at an increased risk for:  Long-term (chronic) heart and lung problems.  Difficulty immediately after birth with regulating body systems, including blood sugar, body temperature, heart rate, and breathing rate.  Bleeding in the brain.  Cerebral palsy.  Learning difficulties.  Death. These risks are highest for babies who are born before 73 weeks of pregnancy. How is preterm labor treated? Treatment depends on the length of your pregnancy, your condition, and the health of your baby. It may involve:  Having a stitch (suture) placed in your cervix to prevent your cervix from opening too early (cerclage).  Taking or being given medicines, such as: ? Hormone medicines. These may be given early in pregnancy to help support the pregnancy. ? Medicine to stop contractions. ? Medicines to help mature the baby's lungs. These may be prescribed if the risk of delivery is high. ? Medicines to prevent your baby from developing cerebral palsy. If the labor happens before 34 weeks of pregnancy, you may need to stay in the hospital. What should I do if I think I am in preterm labor? If you think that you are going into preterm labor, call your health care provider right away. How can I prevent preterm labor in future pregnancies? To increase your chance  of having a full-term pregnancy:  Do not use any tobacco products, such as cigarettes, chewing tobacco, and e-cigarettes. If you need help quitting, ask your health care provider.  Do not use street drugs or medicines that have not been prescribed to you during your pregnancy.  Talk with your health care provider before taking any herbal supplements, even if you have been taking them regularly.  Make sure you gain a healthy amount of weight during your pregnancy.  Watch for infection. If you think that you might have an infection, get it checked right away.  Make sure to tell your health care provider if you have gone into preterm labor before. This information is not intended to replace advice given to you by your health care provider. Make sure you discuss any questions you have with your health care provider. Document Released: 06/04/2003 Document Revised: 07/06/2018 Document Reviewed: 08/05/2015 Elsevier Patient Education  2020 [employer].

## 2019-03-28 ENCOUNTER — Other Ambulatory Visit: Payer: Self-pay

## 2019-03-28 ENCOUNTER — Encounter (HOSPITAL_COMMUNITY): Payer: Self-pay | Admitting: Obstetrics & Gynecology

## 2019-03-28 ENCOUNTER — Inpatient Hospital Stay (HOSPITAL_COMMUNITY)
Admission: AD | Admit: 2019-03-28 | Discharge: 2019-03-28 | Disposition: A | Discharge status: Home / Self Care | Payer: Medicaid Other | Attending: Obstetrics & Gynecology | Admitting: Obstetrics & Gynecology

## 2019-03-28 DIAGNOSIS — O26893 Other specified pregnancy related conditions, third trimester: Secondary | ICD-10-CM

## 2019-03-28 DIAGNOSIS — R42 Dizziness and giddiness: Secondary | ICD-10-CM

## 2019-03-28 DIAGNOSIS — E039 Hypothyroidism, unspecified: Secondary | ICD-10-CM | POA: Insufficient documentation

## 2019-03-28 DIAGNOSIS — H539 Unspecified visual disturbance: Secondary | ICD-10-CM | POA: Diagnosis not present

## 2019-03-28 DIAGNOSIS — R110 Nausea: Secondary | ICD-10-CM | POA: Diagnosis not present

## 2019-03-28 DIAGNOSIS — O99283 Endocrine, nutritional and metabolic diseases complicating pregnancy, third trimester: Secondary | ICD-10-CM | POA: Diagnosis not present

## 2019-03-28 DIAGNOSIS — O4693 Antepartum hemorrhage, unspecified, third trimester: Secondary | ICD-10-CM | POA: Diagnosis not present

## 2019-03-28 DIAGNOSIS — Z3A32 32 weeks gestation of pregnancy: Secondary | ICD-10-CM | POA: Insufficient documentation

## 2019-03-28 DIAGNOSIS — R1011 Right upper quadrant pain: Secondary | ICD-10-CM | POA: Insufficient documentation

## 2019-03-28 DIAGNOSIS — R519 Headache, unspecified: Secondary | ICD-10-CM | POA: Diagnosis not present

## 2019-03-28 LAB — URINALYSIS, ROUTINE W REFLEX MICROSCOPIC
Bilirubin Urine: NEGATIVE
Glucose, UA: NEGATIVE mg/dL
Hgb urine dipstick: NEGATIVE
Ketones, ur: NEGATIVE mg/dL
Nitrite: NEGATIVE
Protein, ur: NEGATIVE mg/dL
Specific Gravity, Urine: 1.019 (ref 1.005–1.030)
Squamous Epithelial / HPF: >50 — ABNORMAL HIGH (ref 0–5)
pH: 5 (ref 5.0–8.0)

## 2019-03-28 LAB — COMPREHENSIVE METABOLIC PANEL
ALT: 15 U/L (ref 0–44)
AST: 15 U/L (ref 15–41)
Albumin: 2.1 g/dL — ABNORMAL LOW (ref 3.5–5.0)
Alkaline Phosphatase: 109 U/L (ref 38–126)
Anion gap: 9 (ref 5–15)
BUN: 8 mg/dL (ref 6–20)
CO2: 20 mmol/L — ABNORMAL LOW (ref 22–32)
Calcium: 8.4 mg/dL — ABNORMAL LOW (ref 8.9–10.3)
Chloride: 107 mmol/L (ref 98–111)
Creatinine, Ser: 0.82 mg/dL (ref 0.44–1.00)
GFR calc Af Amer: >60 mL/min (ref >60)
GFR calc non Af Amer: >60 mL/min (ref >60)
Glucose, Bld: 75 mg/dL (ref 70–99)
Potassium: 3.7 mmol/L (ref 3.5–5.1)
Sodium: 136 mmol/L (ref 135–145)
Total Bilirubin: 0.3 mg/dL (ref 0.3–1.2)
Total Protein: 6.5 g/dL (ref 6.5–8.1)

## 2019-03-28 LAB — PROTEIN / CREATININE RATIO, URINE
Creatinine, Urine: 195.78 mg/dL
Protein Creatinine Ratio: 0.12 mg/mg{Cre} (ref 0.00–0.15)
Total Protein, Urine: 24 mg/dL

## 2019-03-28 LAB — CBC
HCT: 35 % — ABNORMAL LOW (ref 36.0–46.0)
Hemoglobin: 11.5 g/dL — ABNORMAL LOW (ref 12.0–15.0)
MCH: 29.6 pg (ref 26.0–34.0)
MCHC: 32.9 g/dL (ref 30.0–36.0)
MCV: 90 fL (ref 80.0–100.0)
Platelets: 162 10*3/uL (ref 150–400)
RBC: 3.89 MIL/uL (ref 3.87–5.11)
RDW: 13.9 % (ref 11.5–15.5)
WBC: 11.6 10*3/uL — ABNORMAL HIGH (ref 4.0–10.5)
nRBC: 0 % (ref 0.0–0.2)

## 2019-03-28 MED ORDER — DIPHENHYDRAMINE HCL 25 MG PO CAPS
25.0000 mg | ORAL_CAPSULE | Freq: Once | ORAL | Status: DC
  Filled 2019-03-28: qty 1

## 2019-03-28 MED ORDER — METOCLOPRAMIDE HCL 10 MG PO TABS
10.0000 mg | ORAL_TABLET | Freq: Once | ORAL | Status: DC
  Filled 2019-03-28: qty 1

## 2019-03-28 NOTE — MAU Provider Note (Addendum)
Chief Complaint:  Headache, Nausea, Vaginal Bleeding, Blurred Vision, and Dizziness   First Provider Initiated Contact with Patient 03/28/19 1228      HPI: Hailey Thompson is a 23 y.o. G4P1021 at [redacted]w[redacted]d by LMP who presents to maternity admissions reporting headache, nausea, blurred vision x 3-4 days. She denies any HTN before or during pregnancy. The headache is frontal, constant dull pain that is not resolved by Tylenol. It waxes and wanes in intensity and is sometimes 8/10. Currently it is 5/10. There are no other symptoms. She has not tried any other treatments.    HPI  Past Medical History: Past Medical History:  Diagnosis Date  . Hypothyroidism   . Migraine   . Thyroid disease   . UTI (lower urinary tract infection)     Past obstetric history: OB History  Gravida Para Term Preterm AB Living  4 1 1   2 1   SAB TAB Ectopic Multiple Live Births  2       1    # Outcome Date GA Lbr Len/2nd Weight Sex Delivery Anes PTL Lv  4 Current           3 Term 05/05/14 [redacted]w[redacted]d  3138 g M CS-LTranv EPI  LIV     Complications: Breech presentation  2 SAB           1 SAB             Obstetric Comments  Pt has had 2-3 miscarriages in the past.     Past Surgical History: Past Surgical History:  Procedure Laterality Date  . APPENDECTOMY    . CESAREAN SECTION    . CHOLECYSTECTOMY N/A 11/06/2015   Procedure: LAPAROSCOPIC CHOLECYSTECTOMY;  Surgeon: Vickie Epley, MD;  Location: [facility];  Service: General;  Laterality: N/A;    Family History: Family History  Problem Relation Age of Onset  . Short stature Sister        s/p kidney failure  . Kidney disease Sister   . Deafness Sister   . Diabetes Mother        type 1 dx as young child. now with multiple complications  . Thyroid disease Mother   . Deep vein thrombosis Mother   . Cancer Maternal Grandmother        breast  . Cancer Maternal Grandfather   . Short stature Sister        paternal half sister 87'8"    Social History: Social  History   Tobacco Use  . Smoking status: Never Smoker  . Smokeless tobacco: Never Used  Substance Use Topics  . Alcohol use: No  . Drug use: No    Allergies:  Allergies  Allergen Reactions  . Mushroom Extract Complex Anaphylaxis  . Chocolate Itching  . Oxycodone Diarrhea  . Prednisone Other (See Comments)    hallucinations  . Versed [Midazolam] Hives    Hives after IV administration of zofran and versed. Unsure which medication caused reaction  . Zofran [Ondansetron Hcl] Hives    Hives after IV adminstration of zofran and versed. Unsure of which medication caused reaction.     Meds:  No medications prior to admission.    ROS:  Review of Systems  Constitutional: Negative for chills, fatigue and fever.  Eyes: Positive for visual disturbance.  Respiratory: Negative for cough and shortness of breath.   Cardiovascular: Negative for chest pain.  Gastrointestinal: Negative for abdominal pain, nausea and vomiting.  Genitourinary: Negative for difficulty urinating, dysuria, flank pain, frequency, pelvic  pain, urgency, vaginal bleeding, vaginal discharge and vaginal pain.  Musculoskeletal: Negative.   Neurological: Positive for dizziness and headaches.  Psychiatric/Behavioral: Negative.      I have reviewed patient's Past Medical Hx, Surgical Hx, Family Hx, Social Hx, medications and allergies.   Physical Exam   Patient Vitals for the past 24 hrs:  BP Temp Temp src Pulse Resp SpO2 Height Weight  03/28/19 1444 (!) 111/57 -- -- 83 -- -- -- --  03/28/19 1345 92/60 -- -- 85 -- -- -- --  03/28/19 1331 (!) 105/58 -- -- 75 -- -- -- --  03/28/19 1315 105/66 -- -- 77 -- -- -- --  03/28/19 1300 98/61 -- -- 79 -- -- -- --  03/28/19 1245 (!) 100/58 -- -- 76 -- -- -- --  03/28/19 1231 103/61 -- -- 86 -- -- -- --  03/28/19 1200 (!) 92/53 -- -- 79 -- -- -- --  03/28/19 1146 (!) 93/55 -- -- 85 -- -- -- --  03/28/19 1130 (!) 100/52 98.4 F (36.9 C) Oral 79 18 99 % 4\' 6"  (1.372 m) 86.5  kg   Constitutional: Well-developed, well-nourished female in no acute distress.  Cardiovascular: normal rate Respiratory: normal effort GI: Abd soft, non-tender, gravid appropriate for gestational age.  MS: Extremities nontender, no edema, normal ROM Neurologic: Alert and oriented x 4.  GU: Neg CVAT.  PELVIC EXAM: Cervix pink, visually closed, without lesion, scant white creamy discharge, vaginal walls and external genitalia normal Bimanual exam: Cervix 0/long/high, firm, anterior, neg CMT, uterus nontender, nonenlarged, adnexa without tenderness, enlargement, or mass     FHT:  Baseline 120 , moderate variability, accelerations present, no decelerations Contractions: None on toco or to palpation   Labs: Results for orders placed or performed during the hospital encounter of 03/28/19 (from the past 24 hour(s))  Urinalysis, Routine w reflex microscopic     Status: Abnormal   Collection Time: 03/28/19 12:20 PM  Result Value Ref Range   Color, Urine AMBER (A) YELLOW   APPearance CLOUDY (A) CLEAR   Specific Gravity, Urine 1.019 1.005 - 1.030   pH 5.0 5.0 - 8.0   Glucose, UA NEGATIVE NEGATIVE mg/dL   Hgb urine dipstick NEGATIVE NEGATIVE   Bilirubin Urine NEGATIVE NEGATIVE   Ketones, ur NEGATIVE NEGATIVE mg/dL   Protein, ur NEGATIVE NEGATIVE mg/dL   Nitrite NEGATIVE NEGATIVE   Leukocytes,Ua SMALL (A) NEGATIVE   RBC / HPF 0-5 0 - 5 RBC/hpf   WBC, UA 11-20 0 - 5 WBC/hpf   Bacteria, UA RARE (A) NONE SEEN   Squamous Epithelial / LPF >50 (H) 0 - 5   Mucus PRESENT   Protein / creatinine ratio, urine     Status: None   Collection Time: 03/28/19 12:20 PM  Result Value Ref Range   Creatinine, Urine 195.78 mg/dL   Total Protein, Urine 24 mg/dL   Protein Creatinine Ratio 0.12 0.00 - 0.15 mg/mg[Cre]  CBC     Status: Abnormal   Collection Time: 03/28/19 12:30 PM  Result Value Ref Range   WBC 11.6 (H) 4.0 - 10.5 K/uL   RBC 3.89 3.87 - 5.11 MIL/uL   Hemoglobin 11.5 (L) 12.0 - 15.0 g/dL    HCT 35.0 (L) 36.0 - 46.0 %   MCV 90.0 80.0 - 100.0 fL   MCH 29.6 26.0 - 34.0 pg   MCHC 32.9 30.0 - 36.0 g/dL   RDW 13.9 11.5 - 15.5 %   Platelets 162 150 - 400 K/uL  nRBC 0.0 0.0 - 0.2 %  Comprehensive metabolic panel     Status: Abnormal   Collection Time: 03/28/19 12:30 PM  Result Value Ref Range   Sodium 136 135 - 145 mmol/L   Potassium 3.7 3.5 - 5.1 mmol/L   Chloride 107 98 - 111 mmol/L   CO2 20 (L) 22 - 32 mmol/L   Glucose, Bld 75 70 - 99 mg/dL   BUN 8 6 - 20 mg/dL   Creatinine, Ser 0.82 0.44 - 1.00 mg/dL   Calcium 8.4 (L) 8.9 - 10.3 mg/dL   Total Protein 6.5 6.5 - 8.1 g/dL   Albumin 2.1 (L) 3.5 - 5.0 g/dL   AST 15 15 - 41 U/L   ALT 15 0 - 44 U/L   Alkaline Phosphatase 109 38 - 126 U/L   Total Bilirubin 0.3 0.3 - 1.2 mg/dL   GFR calc non Af Amer >60 >60 mL/min   GFR calc Af Amer >60 >60 mL/min   Anion gap 9 5 - 15   A/Positive/-- (08/10 1639)  Imaging:  No results found.  MAU Course/MDM: Orders Placed This Encounter  Procedures  . Urinalysis, Routine w reflex microscopic  . CBC  . Comprehensive metabolic panel  . Protein / creatinine ratio, urine  . Discharge patient    Meds ordered this encounter  Medications  . DISCONTD: diphenhydrAMINE (BENADRYL) capsule 25 mg  . DISCONTD: metoCLOPramide (REGLAN) tablet 10 mg     NST reviewed and reactive Ordered headache cocktail PO as pt declined IV.  When RN went in to give medications, pt reported her h/a resolved with just PO fluids and rest.   No medications given in MAU No anemia with Hgb 11.5 today, PEC labs wnl Pt BP low normal and pt reports recently reducing caffeine intake Recommend increased PO fluids, regular meals/snacks and increased protein. Treat intermittent h/a with Tylenol, Benadryl, and/or caffeine PRN F/U with [facility] if h/a persist Pt discharge with strict return precautions.    Assessment: 1. Headache in pregnancy, antepartum, third trimester   2. Dizziness     Plan: Discharge  home Labor precautions and fetal kick counts Follow-up Information    [facility].   Why: As scheduled 03/31/18, Return to MAU as needed for emergencies Contact information: [place] [id number] [phone]         Allergies as of 03/28/2019      Reactions   Mushroom Extract Complex Anaphylaxis   Chocolate Itching   Oxycodone Diarrhea   Prednisone Other (See Comments)   hallucinations   Versed [midazolam] Hives   Hives after IV administration of zofran and versed. Unsure which medication caused reaction   Zofran [ondansetron Hcl] Hives   Hives after IV adminstration of zofran and versed. Unsure of which medication caused reaction.       Medication List    TAKE these medications   Blood Pressure Monitor Misc For regular home bp monitoring during pregnancy   ferrous sulfate 325 (65 FE) MG tablet Commonly known as: FerrouSul Take 1 tablet (325 mg total) by mouth daily with breakfast.   levothyroxine 150 MCG tablet Commonly known as: SYNTHROID Take 1 tablet (150 mcg total) by mouth daily before breakfast.   PRENATAL VITAMIN PO Take by mouth daily.   promethazine 25 MG tablet Commonly known as: PHENERGAN Take 1 tablet (25 mg total) by mouth every 6 (six) hours as needed for nausea or vomiting.   TYLENOL PO Take by  mouth as needed.   Vitamin D3 125 MCG (5000 UT) Caps Take 1 capsule (5,000 Units total) by mouth daily.       Fatima Blank Certified Nurse-Midwife 03/28/2019 6:19 PM

## 2019-03-28 NOTE — MAU Note (Signed)
Sent in for pre- eval.  Been having on going HA, nothing takes the edge off. Has been dizzy, nauseated.  Late last night started having pain in RUQ.  Vision gets a little.  Denies increase in swelling.

## 2019-04-01 ENCOUNTER — Encounter: Payer: Medicaid Other | Admitting: Women's Health

## 2019-04-01 ENCOUNTER — Encounter: Payer: Self-pay | Admitting: Advanced Practice Midwife

## 2019-04-01 ENCOUNTER — Other Ambulatory Visit: Payer: Self-pay

## 2019-04-01 ENCOUNTER — Encounter: Payer: Medicaid Other | Admitting: Obstetrics & Gynecology

## 2019-04-01 ENCOUNTER — Ambulatory Visit (INDEPENDENT_AMBULATORY_CARE_PROVIDER_SITE_OTHER): Payer: Medicaid Other | Admitting: Advanced Practice Midwife

## 2019-04-01 VITALS — BP 111/72 | HR 84 | Wt 191.0 lb

## 2019-04-01 DIAGNOSIS — Z3483 Encounter for supervision of other normal pregnancy, third trimester: Secondary | ICD-10-CM

## 2019-04-01 DIAGNOSIS — Z3A32 32 weeks gestation of pregnancy: Secondary | ICD-10-CM

## 2019-04-01 DIAGNOSIS — Z1389 Encounter for screening for other disorder: Secondary | ICD-10-CM

## 2019-04-01 DIAGNOSIS — Z331 Pregnant state, incidental: Secondary | ICD-10-CM

## 2019-04-01 LAB — POCT URINALYSIS DIPSTICK OB
Blood, UA: NEGATIVE
Glucose, UA: NEGATIVE
Ketones, UA: NEGATIVE
Leukocytes, UA: NEGATIVE
Nitrite, UA: NEGATIVE

## 2019-04-01 NOTE — Progress Notes (Signed)
   LOW-RISK PREGNANCY VISIT Patient name: Hailey Thompson MRN [id number]  Date of birth: [birth year] Chief Complaint:   Routine Prenatal Visit (pressure come and gone)  History of Present Illness:   Hailey Thompson is a 24 y.o. [phone] female at [redacted]w[redacted]d with an Estimated Date of Delivery: 05/21/19 being seen today for ongoing management of a low-risk pregnancy.  Today she reports no cramping. Contractions: Irregular.  .  Movement: Present. denies leaking of fluid. Review of Systems:   Pertinent items are noted in HPI Denies abnormal vaginal discharge w/ itching/odor/irritation, headaches, visual changes, shortness of breath, chest pain, abdominal pain, severe nausea/vomiting, or problems with urination or bowel movements unless otherwise stated above. Pertinent History Reviewed:  Reviewed past medical,surgical, social, obstetrical and family history.  Reviewed problem list, medications and allergies. Physical Assessment:   Vitals:   04/01/19 1351  BP: 111/72  Pulse: 84  Weight: 191 lb (86.6 kg)  Body mass index is 46.05 kg/m.        Physical Examination:   General appearance: Well appearing, and in no distress  Mental status: Alert, oriented to person, place, and time  Skin: Warm & dry  Cardiovascular: Normal heart rate noted  Respiratory: Normal respiratory effort, no distress  Abdomen: Soft, gravid, nontender  Pelvic: Cervical exam deferred         Extremities: Edema: None  Fetal Status:     Movement: Present    Chaperone: n/a    Results for orders placed or performed in visit on 04/01/19 (from the past 24 hour(s))  POC Urinalysis Dipstick OB   Collection Time: 04/01/19  1:59 PM  Result Value Ref Range   Color, UA     Clarity, UA     Glucose, UA Negative Negative   Bilirubin, UA     Ketones, UA neg    Spec Grav, UA     Blood, UA neg    pH, UA     POC,PROTEIN,UA Trace Negative, Trace, Small (1+), Moderate (2+), Large (3+), 4+   Urobilinogen, UA     Nitrite, UA neg    Leukocytes, UA Negative Negative   Appearance     Odor      Assessment & Plan:  1) Low-risk pregnancy [id number] at [redacted]w[redacted]d with an Estimated Date of Delivery: 05/21/19    Meds: No orders of the defined types were placed in this encounter.  Labs/procedures today:   Plan:  Continue routine obstetrical care  Next visit: prefers in person    Reviewed: Preterm labor symptoms and general obstetric precautions including but not limited to vaginal bleeding, contractions, leaking of fluid and fetal movement were reviewed in detail with the patient.  All questions were answered. Has home bp cuff. Check bp weekly, let us know if >140/90.   Follow-up: Return in about 2 weeks (around 04/15/2019) for needs LROB appointment w/[facility] to schedule CS (2-3 weeks).  Orders Placed This Encounter  Procedures  . POC Urinalysis Dipstick OB   Christin Fudge DNP, CNM 04/01/2019 2:16 PM

## 2019-04-01 NOTE — Patient Instructions (Addendum)
Hailey Thompson, I greatly value your feedback.  If you receive a survey following your visit with [place] today, we appreciate you taking the time to fill it out.  Thanks, Nigel Berthold, CNM   [place]!!! It is now [place] at [facility] ([place], [place] [address]) Entrance located off of [address] parking   Go to [email or web address] to register for FREE online childbirth classes    Call the office [phone]) or go to [facility] if:  You begin to have strong, frequent contractions  Your water breaks.  Sometimes it is a big gush of fluid, sometimes it is just a trickle that keeps getting your panties wet or running down your legs  You have vaginal bleeding.  It is normal to have a small amount of spotting if your cervix was checked.   You don't feel your baby moving like normal.  If you don't, get you something to eat and drink and lay down and focus on feeling your baby move.  You should feel at least 10 movements in 2 hours.  If you don't, you should call the office or go to [facility]    Tdap Vaccine  It is recommended that you get the Tdap vaccine during the third trimester of EACH pregnancy to help protect your baby from getting pertussis (whooping cough)  27-36 weeks is the BEST time to do this so that you can pass the protection on to your baby. During pregnancy is better than after pregnancy, but if you are unable to get it during pregnancy it will be offered at the hospital.   You will be offered this vaccine in the office after 27 weeks. If you do not have health insurance, you can get this vaccine at the health department or your family doctor  Everyone who will be around your baby should also be up-to-date on their vaccines. Adults (who are not pregnant) only need 1 dose of Tdap during adulthood.   Third Trimester of Pregnancy The third trimester is from week 29 through week 42, months 7 through 9. The  third trimester is a time when the fetus is growing rapidly. At the end of the ninth month, the fetus is about 20 inches in length and weighs 6-10 pounds.  BODY CHANGES Your body goes through many changes during pregnancy. The changes vary from woman to woman.   Your weight will continue to increase. You can expect to gain 25-35 pounds (11-16 kg) by the end of the pregnancy.  You may begin to get stretch marks on your hips, abdomen, and breasts.  You may urinate more often because the fetus is moving lower into your pelvis and pressing on your bladder.  You may develop or continue to have heartburn as a result of your pregnancy.  You may develop constipation because certain hormones are causing the muscles that push waste through your intestines to slow down.  You may develop hemorrhoids or swollen, bulging veins (varicose veins).  You may have pelvic pain because of the weight gain and pregnancy hormones relaxing your joints between the bones in your pelvis. Backaches may result from overexertion of the muscles supporting your posture.  You may have changes in your hair. These can include thickening of your hair, rapid growth, and changes in texture. Some women also have hair loss during or after pregnancy, or hair that feels dry or thin. Your hair will most likely return to normal  after your baby is born.  Your breasts will continue to grow and be tender. A yellow discharge may leak from your breasts called colostrum.  Your belly button may stick out.  You may feel short of breath because of your expanding uterus.  You may notice the fetus "dropping," or moving lower in your abdomen.  You may have a bloody mucus discharge. This usually occurs a few days to a week before labor begins.  Your cervix becomes thin and soft (effaced) near your due date. WHAT TO EXPECT AT YOUR PRENATAL EXAMS  You will have prenatal exams every 2 weeks until week 36. Then, you will have weekly prenatal  exams. During a routine prenatal visit:  You will be weighed to make sure you and the fetus are growing normally.  Your blood pressure is taken.  Your abdomen will be measured to track your baby's growth.  The fetal heartbeat will be listened to.  Any test results from the previous visit will be discussed.  You may have a cervical check near your due date to see if you have effaced. At around 36 weeks, your caregiver will check your cervix. At the same time, your caregiver will also perform a test on the secretions of the vaginal tissue. This test is to determine if a type of bacteria, Group B streptococcus, is present. Your caregiver will explain this further. Your caregiver may ask you:  What your birth plan is.  How you are feeling.  If you are feeling the baby move.  If you have had any abnormal symptoms, such as leaking fluid, bleeding, severe headaches, or abdominal cramping.  If you have any questions. Other tests or screenings that may be performed during your third trimester include:  Blood tests that check for low iron levels (anemia).  Fetal testing to check the health, activity level, and growth of the fetus. Testing is done if you have certain medical conditions or if there are problems during the pregnancy. FALSE LABOR You may feel small, irregular contractions that eventually go away. These are called Braxton Hicks contractions, or false labor. Contractions may last for hours, days, or even weeks before true labor sets in. If contractions come at regular intervals, intensify, or become painful, it is best to be seen by your caregiver.  SIGNS OF LABOR   Menstrual-like cramps.  Contractions that are 5 minutes apart or less.  Contractions that start on the top of the uterus and spread down to the lower abdomen and back.  A sense of increased pelvic pressure or back pain.  A watery or bloody mucus discharge that comes from the vagina. If you have any of these  signs before the 37th week of pregnancy, call your caregiver right away. You need to go to the hospital to get checked immediately. HOME CARE INSTRUCTIONS   Avoid all smoking, herbs, alcohol, and unprescribed drugs. These chemicals affect the formation and growth of the baby.  Follow your caregiver's instructions regarding medicine use. There are medicines that are either safe or unsafe to take during pregnancy.  Exercise only as directed by your caregiver. Experiencing uterine cramps is a good sign to stop exercising.  Continue to eat regular, healthy meals.  Wear a good support bra for breast tenderness.  Do not use hot tubs, steam rooms, or saunas.  Wear your seat belt at all times when driving.  Avoid raw meat, uncooked cheese, cat litter boxes, and soil used by cats. These carry germs that can cause  birth defects in the baby.  Take your prenatal vitamins.  Try taking a stool softener (if your caregiver approves) if you develop constipation. Eat more high-fiber foods, such as fresh vegetables or fruit and whole grains. Drink plenty of fluids to keep your urine clear or pale yellow.  Take warm sitz baths to soothe any pain or discomfort caused by hemorrhoids. Use hemorrhoid cream if your caregiver approves.  If you develop varicose veins, wear support hose. Elevate your feet for 15 minutes, 3-4 times a day. Limit salt in your diet.  Avoid heavy lifting, wear low heal shoes, and practice good posture.  Rest a lot with your legs elevated if you have leg cramps or low back pain.  Visit your dentist if you have not gone during your pregnancy. Use a soft toothbrush to brush your teeth and be gentle when you floss.  A sexual relationship may be continued unless your caregiver directs you otherwise.  Do not travel far distances unless it is absolutely necessary and only with the approval of your caregiver.  Take prenatal classes to understand, practice, and ask questions about the  labor and delivery.  Make a trial run to the hospital.  Pack your hospital bag.  Prepare the baby's nursery.  Continue to go to all your prenatal visits as directed by your caregiver. SEEK MEDICAL CARE IF:  You are unsure if you are in labor or if your water has broken.  You have dizziness.  You have mild pelvic cramps, pelvic pressure, or nagging pain in your abdominal area.  You have persistent nausea, vomiting, or diarrhea.  You have a bad smelling vaginal discharge.  You have pain with urination. SEEK IMMEDIATE MEDICAL CARE IF:   You have a fever.  You are leaking fluid from your vagina.  You have spotting or bleeding from your vagina.  You have severe abdominal cramping or pain.  You have rapid weight loss or gain.  You have shortness of breath with chest pain.  You notice sudden or extreme swelling of your face, hands, ankles, feet, or legs.  You have not felt your baby move in over an hour.  You have severe headaches that do not go away with medicine.  You have vision changes. Document Released: 03/08/2001 Document Revised: 03/19/2013 Document Reviewed: 05/15/2012 [facility] Patient Information 2015 [place], [place]. This information is not intended to replace advice given to you by your health care provider. Make sure you discuss any questions you have with your health care provider.  AFTER 34 WEEKS:  AM I IN LABOR? What is labor? Labor is the work that your body does to birth your baby. Your uterus (the womb) contracts. Your cervix (the mouth of the uterus) opens. You will push your baby out into the world.  What do contractions (labor pains) feel like? When they first start, contractions usually feel like cramps during your period. Sometimes you feel pain in your back. Most often, contractions feel like muscles pulling painfully in your lower belly. At first, the contractions will probably be 15 to 20 minutes apart. They will not feel too painful. As labor  goes on, the contractions get stronger, closer together, and more painful.  How do I time the contractions? Time your contractions by counting the number of minutes from the start of one contraction to the start of the next contraction.  What should I do when the contractions start? If it is night and you can sleep, sleep. If it happens during the day,  here are some things you can do to take care of yourself at home: ? Walk. If the pains you are having are real labor, walking will make the contractions come faster and harder. If the contractions are not going to continue and be real labor, walking will make the contractions slow down. ? Take a shower or bath. This will help you relax. ? Eat. Labor is a big event. It takes a lot of energy. ? Drink water. Not drinking enough water can cause false labor (contractions that hurt but do not open your cervix). If this is true labor, drinking water will help you have strength to get through your labor. ? Take a nap. Get all the rest you can. ? Get a massage. If your labor is in your back, a strong massage on your lower back may feel very good. Getting a foot massage is always good. ? Don't panic. You can do this. Your body was made for this. You are strong!  When should I go to the hospital or call my health care provider? ? Your contractions have been 5 minutes apart or less for at least 1 hour. ? If several contractions are so painful you cannot walk or talk during one. ? Your bag of waters breaks. (You may have a big gush of water or just water that runs down your legs when you walk.)  Are there other reasons to call my health care provider? Yes, you should call your health care provider or go to the hospital if you start to bleed like you are having a period-- blood that soaks your underwear or runs down your legs, if you have sudden severe pain, if your baby has not moved for several hours, or if you are leaking green fluid. The rule is as follows:  If you are very concerned about something, call.

## 2019-04-15 ENCOUNTER — Encounter: Payer: Self-pay | Admitting: Advanced Practice Midwife

## 2019-04-15 ENCOUNTER — Ambulatory Visit (INDEPENDENT_AMBULATORY_CARE_PROVIDER_SITE_OTHER): Payer: Medicaid Other | Admitting: Advanced Practice Midwife

## 2019-04-15 ENCOUNTER — Telehealth: Payer: Self-pay | Admitting: *Deleted

## 2019-04-15 ENCOUNTER — Encounter: Payer: Self-pay | Admitting: *Deleted

## 2019-04-15 ENCOUNTER — Other Ambulatory Visit: Payer: Self-pay

## 2019-04-15 VITALS — BP 112/79 | HR 80 | Wt 189.6 lb

## 2019-04-15 DIAGNOSIS — Z1389 Encounter for screening for other disorder: Secondary | ICD-10-CM

## 2019-04-15 DIAGNOSIS — O99013 Anemia complicating pregnancy, third trimester: Secondary | ICD-10-CM

## 2019-04-15 DIAGNOSIS — Z331 Pregnant state, incidental: Secondary | ICD-10-CM

## 2019-04-15 DIAGNOSIS — Z3483 Encounter for supervision of other normal pregnancy, third trimester: Secondary | ICD-10-CM

## 2019-04-15 DIAGNOSIS — Z3A34 34 weeks gestation of pregnancy: Secondary | ICD-10-CM

## 2019-04-15 LAB — POCT URINALYSIS DIPSTICK OB
Blood, UA: NEGATIVE
Glucose, UA: NEGATIVE
Ketones, UA: NEGATIVE
Leukocytes, UA: NEGATIVE
Nitrite, UA: NEGATIVE
POC,PROTEIN,UA: NEGATIVE

## 2019-04-15 NOTE — Patient Instructions (Signed)
Hailey Thompson, I greatly value your feedback.  If you receive a survey following your visit with [place] today, we appreciate you taking the time to fill it out.  Thanks, [name]  [place]!!! It is now [facility] [place] at [facility] ([place], [place] [address]) Entrance located off of [address] parking    Go to [email or web address] to register for FREE online childbirth classes   Call the office [phone]) or go to [facility] if:  You begin to have strong, frequent contractions  Your water breaks.  Sometimes it is a big gush of fluid, sometimes it is just a trickle that keeps getting your panties wet or running down your legs  You have vaginal bleeding.  It is normal to have a small amount of spotting if your cervix was checked.   You don't feel your baby moving like normal.  If you don't, get you something to eat and drink and lay down and focus on feeling your baby move.  You should feel at least 10 movements in 2 hours.  If you don't, you should call the office or go to [facility].    Tdap Vaccine  It is recommended that you get the Tdap vaccine during the third trimester of EACH pregnancy to help protect your baby from getting pertussis (whooping cough)  27-36 weeks is the BEST time to do this so that you can pass the protection on to your baby. During pregnancy is better than after pregnancy, but if you are unable to get it during pregnancy it will be offered at the hospital.   You can get this vaccine with [place], at the health department, your family doctor, or some local pharmacies  Everyone who will be around your baby should also be up-to-date on their vaccines before the baby comes. Adults (who are not pregnant) only need 1 dose of Tdap during adulthood.   [place] Pediatricians/Family Doctors:  [facility] [phone]                  [place] [phone] (usually not accepting new patients unless you have family there already, you are always welcome to call and ask)       [facility] Department [phone]       [facility] Pediatricians/Family Doctors:   [facility]: [phone]  [facility]: [phone]  [facility]: [place] Doctors:   [facility]: [facility]: [place]:  [place]: [phone]   Home Blood Pressure Monitoring for Patients   Your provider has recommended that you check your blood pressure (BP) at least once a week at home. If you do not have a blood pressure cuff at home, one will be provided for you. Contact your provider if you have not received your monitor within 1 week.   Helpful Tips for Accurate Home Blood Pressure Checks  . Don't smoke, exercise, or drink caffeine 30 minutes before checking your BP . Use the restroom before checking your BP (a full bladder can raise your pressure) . Relax in a comfortable upright chair . Feet on the ground . Left arm resting comfortably on a flat surface at the level of your heart . Legs uncrossed . Back supported . Sit quietly and don't talk .  Place the cuff on your bare arm . Adjust snuggly, so that only two fingertips can fit between your skin and the top of the cuff . Check 2 readings separated by at least one minute . Keep a log of your BP readings . For a visual, please reference this diagram: http://ccnc.care/bpdiagram  Provider Name: [facility]     Phone: [phone]  Zone 1: ALL CLEAR  Continue to monitor your symptoms:  . BP reading is less than 140 (top number) or less than 90 (bottom number)  . No right upper stomach pain . No headaches or seeing spots . No feeling nauseated or throwing up . No swelling in face and  hands  Zone 2: CAUTION Call your doctor's office for any of the following:  . BP reading is greater than 140 (top number) or greater than 90 (bottom number)  . Stomach pain under your ribs in the middle or right side . Headaches or seeing spots . Feeling nauseated or throwing up . Swelling in face and hands  Zone 3: EMERGENCY  Seek immediate medical care if you have any of the following:  . BP reading is greater than160 (top number) or greater than 110 (bottom number) . Severe headaches not improving with Tylenol . Serious difficulty catching your breath . Any worsening symptoms from Zone 2   Third Trimester of Pregnancy The third trimester is from week 29 through week 42, months 7 through 9. The third trimester is a time when the fetus is growing rapidly. At the end of the ninth month, the fetus is about 20 inches in length and weighs 6-10 pounds.  BODY CHANGES Your body goes through many changes during pregnancy. The changes vary from woman to woman.   Your weight will continue to increase. You can expect to gain 25-35 pounds (11-16 kg) by the end of the pregnancy.  You may begin to get stretch marks on your hips, abdomen, and breasts.  You may urinate more often because the fetus is moving lower into your pelvis and pressing on your bladder.  You may develop or continue to have heartburn as a result of your pregnancy.  You may develop constipation because certain hormones are causing the muscles that push waste through your intestines to slow down.  You may develop hemorrhoids or swollen, bulging veins (varicose veins).  You may have pelvic pain because of the weight gain and pregnancy hormones relaxing your joints between the bones in your pelvis. Backaches may result from overexertion of the muscles supporting your posture.  You may have changes in your hair. These can include thickening of your hair, rapid growth, and changes in texture. Some women also have hair loss during  or after pregnancy, or hair that feels dry or thin. Your hair will most likely return to normal after your baby is born.  Your breasts will continue to grow and be tender. A yellow discharge may leak from your breasts called colostrum.  Your belly button may stick out.  You may feel short of breath because of your expanding uterus.  You may notice the fetus "dropping," or moving lower in your abdomen.  You may have a bloody mucus discharge. This usually occurs a few days to a week before labor begins.  Your cervix becomes thin and soft (effaced) near your due date. WHAT TO EXPECT AT YOUR PRENATAL EXAMS  You will have prenatal exams every 2 weeks until week 36. Then, you will have weekly prenatal exams. During  a routine prenatal visit:  You will be weighed to make sure you and the fetus are growing normally.  Your blood pressure is taken.  Your abdomen will be measured to track your baby's growth.  The fetal heartbeat will be listened to.  Any test results from the previous visit will be discussed.  You may have a cervical check near your due date to see if you have effaced. At around 36 weeks, your caregiver will check your cervix. At the same time, your caregiver will also perform a test on the secretions of the vaginal tissue. This test is to determine if a type of bacteria, Group B streptococcus, is present. Your caregiver will explain this further. Your caregiver may ask you:  What your birth plan is.  How you are feeling.  If you are feeling the baby move.  If you have had any abnormal symptoms, such as leaking fluid, bleeding, severe headaches, or abdominal cramping.  If you have any questions. Other tests or screenings that may be performed during your third trimester include:  Blood tests that check for low iron levels (anemia).  Fetal testing to check the health, activity level, and growth of the fetus. Testing is done if you have certain medical conditions or if  there are problems during the pregnancy. FALSE LABOR You may feel small, irregular contractions that eventually go away. These are called Braxton Hicks contractions, or false labor. Contractions may last for hours, days, or even weeks before true labor sets in. If contractions come at regular intervals, intensify, or become painful, it is best to be seen by your caregiver.  SIGNS OF LABOR   Menstrual-like cramps.  Contractions that are 5 minutes apart or less.  Contractions that start on the top of the uterus and spread down to the lower abdomen and back.  A sense of increased pelvic pressure or back pain.  A watery or bloody mucus discharge that comes from the vagina. If you have any of these signs before the 37th week of pregnancy, call your caregiver right away. You need to go to the hospital to get checked immediately. HOME CARE INSTRUCTIONS   Avoid all smoking, herbs, alcohol, and unprescribed drugs. These chemicals affect the formation and growth of the baby.  Follow your caregiver's instructions regarding medicine use. There are medicines that are either safe or unsafe to take during pregnancy.  Exercise only as directed by your caregiver. Experiencing uterine cramps is a good sign to stop exercising.  Continue to eat regular, healthy meals.  Wear a good support bra for breast tenderness.  Do not use hot tubs, steam rooms, or saunas.  Wear your seat belt at all times when driving.  Avoid raw meat, uncooked cheese, cat litter boxes, and soil used by cats. These carry germs that can cause birth defects in the baby.  Take your prenatal vitamins.  Try taking a stool softener (if your caregiver approves) if you develop constipation. Eat more high-fiber foods, such as fresh vegetables or fruit and whole grains. Drink plenty of fluids to keep your urine clear or pale yellow.  Take warm sitz baths to soothe any pain or discomfort caused by hemorrhoids. Use hemorrhoid cream if your  caregiver approves.  If you develop varicose veins, wear support hose. Elevate your feet for 15 minutes, 3-4 times a day. Limit salt in your diet.  Avoid heavy lifting, wear low heal shoes, and practice good posture.  Rest a lot with your legs elevated if you  have leg cramps or low back pain.  Visit your dentist if you have not gone during your pregnancy. Use a soft toothbrush to brush your teeth and be gentle when you floss.  A sexual relationship may be continued unless your caregiver directs you otherwise.  Do not travel far distances unless it is absolutely necessary and only with the approval of your caregiver.  Take prenatal classes to understand, practice, and ask questions about the labor and delivery.  Make a trial run to the hospital.  Pack your hospital bag.  Prepare the baby's nursery.  Continue to go to all your prenatal visits as directed by your caregiver. SEEK MEDICAL CARE IF:  You are unsure if you are in labor or if your water has broken.  You have dizziness.  You have mild pelvic cramps, pelvic pressure, or nagging pain in your abdominal area.  You have persistent nausea, vomiting, or diarrhea.  You have a bad smelling vaginal discharge.  You have pain with urination. SEEK IMMEDIATE MEDICAL CARE IF:   You have a fever.  You are leaking fluid from your vagina.  You have spotting or bleeding from your vagina.  You have severe abdominal cramping or pain.  You have rapid weight loss or gain.  You have shortness of breath with chest pain.  You notice sudden or extreme swelling of your face, hands, ankles, feet, or legs.  You have not felt your baby move in over an hour.  You have severe headaches that do not go away with medicine.  You have vision changes. Document Released: 03/08/2001 Document Revised: 03/19/2013 Document Reviewed: 05/15/2012 [facility] Patient Information 2015 [place], [place]. This information is not intended to replace advice  given to you by your health care provider. Make sure you discuss any questions you have with your health care provider.  PROTECT YOURSELF & YOUR BABY FROM THE FLU! Because you are pregnant, we at [facility], along with the Centers for Disease Control (CDC), recommend that you receive the flu vaccine to protect yourself and your baby from the flu. The flu is more likely to cause severe illness in pregnant women than in women of reproductive age who are not pregnant. Changes in the immune system, heart, and lungs during pregnancy make pregnant women (and women up to two weeks postpartum) more prone to severe illness from flu, including illness resulting in hospitalization. Flu also may be harmful for a pregnant woman's developing baby. A common flu symptom is fever, which may be associated with neural tube defects and other adverse outcomes for a developing baby. Getting vaccinated can also help protect a baby after birth from flu. (Mom passes antibodies onto the developing baby during her pregnancy.)  A Flu Vaccine is the Best Protection Against Flu Getting a flu vaccine is the first and most important step in protecting against flu. Pregnant women should get a flu shot and not the live attenuated influenza vaccine (LAIV), also known as nasal spray flu vaccine. Flu vaccines given during pregnancy help protect both the mother and her baby from flu. Vaccination has been shown to reduce the risk of flu-associated acute respiratory infection in pregnant women by up to one-half. A 2018 study showed that getting a flu shot reduced a pregnant woman's risk of being hospitalized with flu by an average of 40 percent. Pregnant women who get a flu vaccine are also helping to protect their babies from flu illness for the first several months after their birth, when they are too  young to get vaccinated.   A Long Record of Safety for Flu Shots in Pregnant Women Flu shots have been given to millions of pregnant women over  many years with a good safety record. There is a lot of evidence that flu vaccines can be given safely during pregnancy; though these data are limited for the first trimester. The CDC recommends that pregnant women get vaccinated during any trimester of their pregnancy. It is very important for pregnant women to get the flu shot.   Other Preventive Actions In addition to getting a flu shot, pregnant women should take the same everyday preventive actions the CDC recommends of everyone, including covering coughs, washing hands often, and avoiding people who are sick.  Symptoms and Treatment If you get sick with flu symptoms call your doctor right away. There are antiviral drugs that can treat flu illness and prevent serious flu complications. The CDC recommends prompt treatment for people who have influenza infection or suspected influenza infection and who are at high risk of serious flu complications, such as people with asthma, diabetes (including gestational diabetes), or heart disease. Early treatment of influenza in hospitalized pregnant women has been shown to reduce the length of the hospital stay.  Symptoms Flu symptoms include fever, cough, sore throat, runny or stuffy nose, body aches, headache, chills and fatigue. Some people may also have vomiting and diarrhea. People may be infected with the flu and have respiratory symptoms without a fever.  Early Treatment is Important for Pregnant Women Treatment should begin as soon as possible because antiviral drugs work best when started early (within 48 hours after symptoms start). Antiviral drugs can make your flu illness milder and make you feel better faster. They may also prevent serious health problems that can result from flu illness. Oral oseltamivir (Tamiflu) is the preferred treatment for pregnant women because it has the most studies available to suggest that it is safe and beneficial. Antiviral drugs require a prescription from your  provider. Having a fever caused by flu infection or other infections early in pregnancy may be linked to birth defects in a baby. In addition to taking antiviral drugs, pregnant women who get a fever should treat their fever with Tylenol (acetaminophen) and contact their provider immediately.  When to [facility] If you are pregnant and have any of these signs, seek care immediately:  Difficulty breathing or shortness of breath  Pain or pressure in the chest or abdomen  Sudden dizziness  Confusion  Severe or persistent vomiting  High fever that is not responding to Tylenol (or store brand equivalent)  Decreased or no movement of your baby  [email or web address]

## 2019-04-15 NOTE — Telephone Encounter (Signed)
LMOVM returning patient's call. She is scheduled for an appt today at 2:30. Advised to call us back if she is not coming to her appt or we will can answer her questions at her appt.

## 2019-04-15 NOTE — Telephone Encounter (Signed)
Patient left message that she needs to speak to a nurse.

## 2019-04-15 NOTE — Progress Notes (Signed)
   LOW-RISK PREGNANCY VISIT Patient name: Hailey Thompson MRN [id number]  Date of birth: [birth year] Chief Complaint:   Routine Prenatal Visit  History of Present Illness:   Hailey Thompson is a 24 y.o. [phone] female at [redacted]w[redacted]d with an Estimated Date of Delivery: 05/21/19 being seen today for ongoing management of a low-risk pregnancy.  Today she reports pelvic discomfort, some pressure. Contractions: Irregular.  .  Movement: Present. denies leaking of fluid. Review of Systems:   Pertinent items are noted in HPI Denies abnormal vaginal discharge w/ itching/odor/irritation, headaches, visual changes, shortness of breath, chest pain, abdominal pain, severe nausea/vomiting, or problems with urination or bowel movements unless otherwise stated above. Pertinent History Reviewed:  Reviewed past medical,surgical, social, obstetrical and family history.  Reviewed problem list, medications and allergies. Physical Assessment:   Vitals:   04/15/19 1450  BP: 112/79  Pulse: 80  Weight: 189 lb 9.6 oz (86 kg)  Body mass index is 45.71 kg/m.        Physical Examination:   General appearance: Well appearing, and in no distress  Mental status: Alert, oriented to person, place, and time  Skin: Warm & dry  Cardiovascular: Normal heart rate noted  Respiratory: Normal respiratory effort, no distress  Abdomen: Soft, gravid, nontender  Pelvic: Cervical exam deferred         Extremities: Edema: None  Fetal Status: Fetal Heart Rate (bpm): 127 Fundal Height: 34 cm Movement: Present    Results for orders placed or performed in visit on 04/15/19 (from the past 24 hour(s))  POC Urinalysis Dipstick OB   Collection Time: 04/15/19  2:43 PM  Result Value Ref Range   Color, UA     Clarity, UA     Glucose, UA Negative Negative   Bilirubin, UA     Ketones, UA neg    Spec Grav, UA     Blood, UA neg    pH, UA     POC,PROTEIN,UA Negative Negative, Trace, Small (1+), Moderate (2+), Large (3+), 4+   Urobilinogen, UA     Nitrite, UA neg    Leukocytes, UA Negative Negative   Appearance     Odor      Assessment & Plan:  1) Low-risk pregnancy [id number] at [redacted]w[redacted]d with an Estimated Date of Delivery: 05/21/19   2) Hypothyroid, nl TSH at 26wks; recheck at [place]  3) Previous C/S for breech, plans rLTCS w/ Dr Glo Herring- will schedule for 39wks   Meds: No orders of the defined types were placed in this encounter.  Labs/procedures today: none  Plan:  Continue routine obstetrical care   Reviewed: Term labor symptoms and general obstetric precautions including but not limited to vaginal bleeding, contractions, leaking of fluid and fetal movement were reviewed in detail with the patient.  All questions were answered. Not sure if has home bp cuff. Check bp weekly, let us know if >140/90.   Follow-up: Return in about 2 weeks (around 04/29/2019) for [place], in person; GBS, cultures.  Orders Placed This Encounter  Procedures  . POC Urinalysis Dipstick OB   Myrtis Ser [facility] 04/15/2019 3:24 PM

## 2019-04-16 ENCOUNTER — Ambulatory Visit: Payer: Medicaid Other | Attending: Internal Medicine

## 2019-04-16 DIAGNOSIS — Z20822 Contact with and (suspected) exposure to covid-19: Secondary | ICD-10-CM

## 2019-04-17 LAB — NOVEL CORONAVIRUS, NAA: SARS-CoV-2, NAA: DETECTED — AB

## 2019-04-18 ENCOUNTER — Telehealth: Payer: Self-pay | Admitting: Nurse Practitioner

## 2019-04-18 ENCOUNTER — Encounter: Payer: Self-pay | Admitting: Medical

## 2019-04-18 DIAGNOSIS — U071 COVID-19: Secondary | ICD-10-CM | POA: Insufficient documentation

## 2019-04-18 NOTE — Telephone Encounter (Signed)
Was unable to reach patient to discuss with [name] about Covid symptoms and the use of bamlanivimab, a monoclonal antibody infusion for those with mild to moderate Covid symptoms and at a high risk of hospitalization.     After further chart review, Pt is qualified to for this infusion due to identified risk factors and co-morbid conditions (BMI 45), however she is currently in her last trimester of pregnancy and is scheduled for C-section on 05/14/19. She will not be eligible to receive infusion.   Patient Active Problem List   Diagnosis Date Noted  . Anemia complicating pregnancy [id number]  . Supervision of normal pregnancy 11/05/2018  . History of cesarean section 09/26/2018  . Other fatigue 01/24/2018  . Vitiligo 10/25/2011  . Short stature 07/19/2011  . Hypothyroidism 07/19/2011  . Obesity 07/19/2011  . Hypercholesterolemia with hypertriglyceridemia 07/19/2011    Alda Lea, AGPCNP-BC Pager: [phone] Amion: Bjorn Pippin

## 2019-04-23 ENCOUNTER — Encounter (HOSPITAL_COMMUNITY): Payer: Self-pay | Admitting: Family Medicine

## 2019-04-23 ENCOUNTER — Other Ambulatory Visit: Payer: Self-pay

## 2019-04-23 ENCOUNTER — Inpatient Hospital Stay (HOSPITAL_COMMUNITY)
Admission: AD | Admit: 2019-04-23 | Discharge: 2019-04-24 | Disposition: A | Discharge status: Home / Self Care | Payer: Medicaid Other | Source: Ambulatory Visit | Attending: Family Medicine | Admitting: Family Medicine

## 2019-04-23 DIAGNOSIS — Z885 Allergy status to narcotic agent status: Secondary | ICD-10-CM | POA: Insufficient documentation

## 2019-04-23 DIAGNOSIS — O34219 Maternal care for unspecified type scar from previous cesarean delivery: Secondary | ICD-10-CM | POA: Insufficient documentation

## 2019-04-23 DIAGNOSIS — O99891 Other specified diseases and conditions complicating pregnancy: Secondary | ICD-10-CM | POA: Insufficient documentation

## 2019-04-23 DIAGNOSIS — Z833 Family history of diabetes mellitus: Secondary | ICD-10-CM | POA: Insufficient documentation

## 2019-04-23 DIAGNOSIS — Z884 Allergy status to anesthetic agent status: Secondary | ICD-10-CM | POA: Insufficient documentation

## 2019-04-23 DIAGNOSIS — M545 Low back pain: Secondary | ICD-10-CM | POA: Insufficient documentation

## 2019-04-23 DIAGNOSIS — Z9049 Acquired absence of other specified parts of digestive tract: Secondary | ICD-10-CM | POA: Insufficient documentation

## 2019-04-23 DIAGNOSIS — Z91018 Allergy to other foods: Secondary | ICD-10-CM | POA: Insufficient documentation

## 2019-04-23 DIAGNOSIS — Z8616 Personal history of COVID-19: Secondary | ICD-10-CM | POA: Insufficient documentation

## 2019-04-23 DIAGNOSIS — Z803 Family history of malignant neoplasm of breast: Secondary | ICD-10-CM | POA: Insufficient documentation

## 2019-04-23 DIAGNOSIS — U071 COVID-19: Secondary | ICD-10-CM

## 2019-04-23 DIAGNOSIS — Z888 Allergy status to other drugs, medicaments and biological substances status: Secondary | ICD-10-CM | POA: Insufficient documentation

## 2019-04-23 DIAGNOSIS — M549 Dorsalgia, unspecified: Secondary | ICD-10-CM

## 2019-04-23 DIAGNOSIS — Z3A36 36 weeks gestation of pregnancy: Secondary | ICD-10-CM | POA: Insufficient documentation

## 2019-04-23 NOTE — MAU Note (Signed)
Hailey Thompson is a 24 y.o. at [redacted]w[redacted]d here in MAU reporting: lower back pain that is constant that started around 1900. She states that she has had decreased fetal movement since 1830 but since she has been here she feels good fetal movement. No VB or LOF. No HA or blurred vision. Patient is a repeat c/s and was COVID + on 04/16/19.   Pain score: 5 Vitals:   04/23/19 2315  BP: 113/62  Pulse: 83  Resp: 19  Temp: 98.3 F (36.8 C)  SpO2: 94%     FHT:141 Lab orders placed from triage:

## 2019-04-24 ENCOUNTER — Encounter (INDEPENDENT_AMBULATORY_CARE_PROVIDER_SITE_OTHER): Payer: Self-pay

## 2019-04-24 DIAGNOSIS — M549 Dorsalgia, unspecified: Secondary | ICD-10-CM

## 2019-04-24 DIAGNOSIS — Z884 Allergy status to anesthetic agent status: Secondary | ICD-10-CM | POA: Diagnosis not present

## 2019-04-24 DIAGNOSIS — Z885 Allergy status to narcotic agent status: Secondary | ICD-10-CM | POA: Diagnosis not present

## 2019-04-24 DIAGNOSIS — Z888 Allergy status to other drugs, medicaments and biological substances status: Secondary | ICD-10-CM | POA: Diagnosis not present

## 2019-04-24 DIAGNOSIS — Z3A36 36 weeks gestation of pregnancy: Secondary | ICD-10-CM

## 2019-04-24 DIAGNOSIS — O99891 Other specified diseases and conditions complicating pregnancy: Secondary | ICD-10-CM

## 2019-04-24 DIAGNOSIS — Z8616 Personal history of COVID-19: Secondary | ICD-10-CM | POA: Diagnosis not present

## 2019-04-24 DIAGNOSIS — Z803 Family history of malignant neoplasm of breast: Secondary | ICD-10-CM | POA: Diagnosis not present

## 2019-04-24 DIAGNOSIS — O34219 Maternal care for unspecified type scar from previous cesarean delivery: Secondary | ICD-10-CM | POA: Diagnosis not present

## 2019-04-24 DIAGNOSIS — M545 Low back pain: Secondary | ICD-10-CM | POA: Diagnosis not present

## 2019-04-24 DIAGNOSIS — Z91018 Allergy to other foods: Secondary | ICD-10-CM | POA: Diagnosis not present

## 2019-04-24 DIAGNOSIS — Z9049 Acquired absence of other specified parts of digestive tract: Secondary | ICD-10-CM | POA: Diagnosis not present

## 2019-04-24 DIAGNOSIS — Z833 Family history of diabetes mellitus: Secondary | ICD-10-CM | POA: Diagnosis not present

## 2019-04-24 LAB — URINALYSIS, ROUTINE W REFLEX MICROSCOPIC
Bilirubin Urine: NEGATIVE
Glucose, UA: NEGATIVE mg/dL
Hgb urine dipstick: NEGATIVE
Ketones, ur: NEGATIVE mg/dL
Leukocytes,Ua: NEGATIVE
Nitrite: NEGATIVE
Protein, ur: NEGATIVE mg/dL
Specific Gravity, Urine: 1.009 (ref 1.005–1.030)
pH: 5 (ref 5.0–8.0)

## 2019-04-24 MED ORDER — CYCLOBENZAPRINE HCL 5 MG PO TABS: 5.0000 mg | ORAL_TABLET | Freq: Three times a day (TID) | ORAL | 0 refills | Status: DC | PRN

## 2019-04-24 MED ORDER — CYCLOBENZAPRINE HCL 10 MG PO TABS
10.0000 mg | ORAL_TABLET | Freq: Once | ORAL | Status: AC
  Administered 2019-04-24: 10 mg via ORAL
  Filled 2019-04-24: qty 1

## 2019-04-24 NOTE — Discharge Instructions (Signed)
10 Things You Can Do to Manage Your COVID-19 Symptoms at Home If you have possible or confirmed COVID-19: 1. Stay home from work and school. And stay away from other public places. If you must go out, avoid using any kind of public transportation, ridesharing, or taxis. 2. Monitor your symptoms carefully. If your symptoms get worse, call your healthcare provider immediately. 3. Get rest and stay hydrated. 4. If you have a medical appointment, call the healthcare provider ahead of time and tell them that you have or may have COVID-19. 5. For medical emergencies, call 911 and notify the dispatch personnel that you have or may have COVID-19. 6. Cover your cough and sneezes with a tissue or use the inside of your elbow. 7. Wash your hands often with soap and water for at least 20 seconds or clean your hands with an alcohol-based hand sanitizer that contains at least 60% alcohol. 8. As much as possible, stay in a specific room and away from other people in your home. Also, you should use a separate bathroom, if available. If you need to be around other people in or outside of the home, wear a mask. 9. Avoid sharing personal items with other people in your household, like dishes, towels, and bedding. 10. Clean all surfaces that are touched often, like counters, tabletops, and doorknobs. Use household cleaning sprays or wipes according to the label instructions. [email or web address] 09/26/2018 This information is not intended to replace advice given to you by your health care provider. Make sure you discuss any questions you have with your health care provider. Document Revised: 02/28/2019 Document Reviewed: 02/28/2019 Elsevier Patient Education  [place].   COVID-19 Frequently Asked Questions COVID-19 (coronavirus disease) is an infection that is caused by a large family of viruses. Some viruses cause illness in people and others cause illness in animals like camels, cats, and bats. In some  cases, the viruses that cause illness in animals can spread to humans. Where did the coronavirus come from? In December 2019, [place] told the [facility]) of several cases of lung disease (human respiratory illness). These cases were linked to an open seafood and livestock market in the city of [place]. The link to the seafood and livestock market suggests that the virus may have spread from animals to humans. However, since that first outbreak in December, the virus has also been shown to spread from person to person. What is the name of the disease and the virus? Disease name Early on, this disease was called novel coronavirus. This is because scientists determined that the disease was caused by a new (novel) respiratory virus. The World Health Organization [facility]) has now named the disease COVID-19, or coronavirus disease. Virus name The virus that causes the disease is called severe acute respiratory syndrome coronavirus 2 (SARS-CoV-2). More information on disease and virus naming World Health Organization [facility]): www.who.int/emergencies/diseases/novel-coronavirus-2019/technical-guidance/naming-the-coronavirus-disease-(covid-2019)-and-the-virus-that-causes-it Who is at risk for complications from coronavirus disease? Some people may be at higher risk for complications from coronavirus disease. This includes older adults and people who have chronic diseases, such as heart disease, diabetes, and lung disease. If you are at higher risk for complications, take these extra precautions:  Stay home as much as possible.  Avoid social gatherings and travel.  Avoid close contact with others. Stay at least 6 ft (2 m) away from others, if possible.  Wash your hands often with soap and water for at least 20 seconds.  Avoid touching your face, mouth, nose, or eyes.  Keep supplies on hand at home, such as food, medicine, and cleaning supplies.  If you must go out in public, wear a cloth  face covering or face mask. Make sure your mask covers your nose and mouth. How does coronavirus disease spread? The virus that causes coronavirus disease spreads easily from person to person (is contagious). You may catch the virus by:  Breathing in droplets from an infected person. Droplets can be spread by a person breathing, speaking, singing, coughing, or sneezing.  Touching something, like a table or a doorknob, that was exposed to the virus (contaminated) and then touching your mouth, nose, or eyes. Can I get the virus from touching surfaces or objects? There is still a lot that we do not know about the virus that causes coronavirus disease. Scientists are basing a lot of information on what they know about similar viruses, such as:  Viruses cannot generally survive on surfaces for long. They need a human body (host) to survive.  It is more likely that the virus is spread by close contact with people who are sick (direct contact), such as through: ? Shaking hands or hugging. ? Breathing in respiratory droplets that travel through the air. Droplets can be spread by a person breathing, speaking, singing, coughing, or sneezing.  It is less likely that the virus is spread when a person touches a surface or object that has the virus on it (indirect contact). The virus may be able to enter the body if the person touches a surface or object and then touches his or her face, eyes, nose, or mouth. Can a person spread the virus without having symptoms of the disease? It may be possible for the virus to spread before a person has symptoms of the disease, but this is most likely not the main way the virus is spreading. It is more likely for the virus to spread by being in close contact with people who are sick and breathing in the respiratory droplets spread by a person breathing, speaking, singing, coughing, or sneezing. What are the symptoms of coronavirus disease? Symptoms vary from person to  person and can range from mild to severe. Symptoms may include:  Fever or chills.  Cough.  Difficulty breathing or feeling short of breath.  Headaches, body aches, or muscle aches.  Runny or stuffy (congested) nose.  Sore throat.  New loss of taste or smell.  Nausea, vomiting, or diarrhea. These symptoms can appear anywhere from 2 to 14 days after you have been exposed to the virus. Some people may not have any symptoms. If you develop symptoms, call your health care provider. People with severe symptoms may need hospital care. Should I be tested for this virus? Your health care provider will decide whether to test you based on your symptoms, history of exposure, and your risk factors. How does a health care provider test for this virus? Health care providers will collect samples to send for testing. Samples may include:  Taking a swab of fluid from the back of your nose and throat, your nose, or your throat.  Taking fluid from the lungs by having you cough up mucus (sputum) into a sterile cup.  Taking a blood sample. Is there a treatment or vaccine for this virus? Currently, there is no vaccine to prevent coronavirus disease. Also, there are no medicines like antibiotics or antivirals to treat the virus. A person who becomes sick is given supportive care, which means rest and fluids. A person may also  relieve his or her symptoms by using over-the-counter medicines that treat sneezing, coughing, and runny nose. These are the same medicines that a person takes for the common cold. If you develop symptoms, call your health care provider. People with severe symptoms may need hospital care. What can I do to protect myself and my family from this virus?     You can protect yourself and your family by taking the same actions that you would take to prevent the spread of other viruses. Take the following actions:  Wash your hands often with soap and water for at least 20 seconds. If soap  and water are not available, use alcohol-based hand sanitizer.  Avoid touching your face, mouth, nose, or eyes.  Cough or sneeze into a tissue, sleeve, or elbow. Do not cough or sneeze into your hand or the air. ? If you cough or sneeze into a tissue, throw it away immediately and wash your hands.  Disinfect objects and surfaces that you frequently touch every day.  Stay away from people who are sick.  Avoid going out in public, follow guidance from your state and local health authorities.  Avoid crowded indoor spaces. Stay at least 6 ft (2 m) away from others.  If you must go out in public, wear a cloth face covering or face mask. Make sure your mask covers your nose and mouth.  Stay home if you are sick, except to get medical care. Call your health care provider before you get medical care. Your health care provider will tell you how long to stay home.  Make sure your vaccines are up to date. Ask your health care provider what vaccines you need. What should I do if I need to travel? Follow travel recommendations from your local health authority, the CDC, and WHO. Travel information and advice  Centers for Disease Control and Prevention (CDC): BodyEditor.hu  World Health Organization [facility]): ThirdIncome.ca Know the risks and take action to protect your health  You are at higher risk of getting coronavirus disease if you are traveling to areas with an outbreak or if you are exposed to travelers from areas with an outbreak.  Wash your hands often and practice good hygiene to lower the risk of catching or spreading the virus. What should I do if I am sick? General instructions to stop the spread of infection  Wash your hands often with soap and water for at least 20 seconds. If soap and water are not available, use alcohol-based hand sanitizer.  Cough or sneeze into a tissue, sleeve, or  elbow. Do not cough or sneeze into your hand or the air.  If you cough or sneeze into a tissue, throw it away immediately and wash your hands.  Stay home unless you must get medical care. Call your health care provider or local health authority before you get medical care.  Avoid public areas. Do not take public transportation, if possible.  If you can, wear a mask if you must go out of the house or if you are in close contact with someone who is not sick. Make sure your mask covers your nose and mouth. Keep your home clean  Disinfect objects and surfaces that are frequently touched every day. This may include: ? Counters and tables. ? Doorknobs and light switches. ? Sinks and faucets. ? Electronics such as phones, remote controls, keyboards, computers, and tablets.  Wash dishes in hot, soapy water or use a dishwasher. Air-dry your dishes.  [place] laundry in  hot water. Prevent infecting other household members  Let healthy household members care for children and pets, if possible. If you have to care for children or pets, wash your hands often and wear a mask.  Sleep in a different bedroom or bed, if possible.  Do not share personal items, such as razors, toothbrushes, deodorant, combs, brushes, towels, and washcloths. Where to find more information Centers for Disease Control and Prevention (CDC)  Information and news updates: https://www.butler-gonzalez.com/ World Health Organization [facility])  Information and news updates: MissExecutive.com.ee  Coronavirus health topic: https://www.castaneda.info/  Questions and answers on COVID-19: OpportunityDebt.at  Global tracker: who.sprinklr.com American Academy of Pediatrics (AAP)  Information for families: www.healthychildren.org/English/health-issues/conditions/chest-lungs/Pages/2019-Novel-Coronavirus.aspx The coronavirus situation is changing rapidly. Check  your local health authority website or the CDC and [facility] websites for updates and news. When should I contact a health care provider?  Contact your health care provider if you have symptoms of an infection, such as fever or cough, and you: ? Have been near anyone who is known to have coronavirus disease. ? Have come into contact with a person who is suspected to have coronavirus disease. ? Have traveled to an area where there is an outbreak of COVID-19. When should I get emergency medical care?  Get help right away by calling your local emergency services (911 in the [place] if you have: ? Trouble breathing. ? Pain or pressure in your chest. ? Confusion. ? Blue-tinged lips and fingernails. ? Difficulty waking from sleep. ? Symptoms that get worse. Let the emergency medical personnel know if you think you have coronavirus disease. Summary  A new respiratory virus is spreading from person to person and causing COVID-19 (coronavirus disease).  The virus that causes COVID-19 appears to spread easily. It spreads from one person to another through droplets from breathing, speaking, singing, coughing, or sneezing.  Older adults and those with chronic diseases are at higher risk of disease. If you are at higher risk for complications, take extra precautions.  There is currently no vaccine to prevent coronavirus disease. There are no medicines, such as antibiotics or antivirals, to treat the virus.  You can protect yourself and your family by washing your hands often, avoiding touching your face, and covering your coughs and sneezes. This information is not intended to replace advice given to you by your health care provider. Make sure you discuss any questions you have with your health care provider. Document Revised: 01/11/2019 Document Reviewed: 07/10/2018 Elsevier Patient Education  [place].  Back Pain in Pregnancy Back pain during pregnancy is common. Back pain may be caused by  several factors that are related to changes during your pregnancy. Follow these instructions at home: Managing pain, stiffness, and swelling      If directed, for sudden (acute) back pain, put ice on the painful area. ? Put ice in a plastic bag. ? Place a towel between your skin and the bag. ? Leave the ice on for 20 minutes, 2-3 times per day.  If directed, apply heat to the affected area before you exercise. Use the heat source that your health care provider recommends, such as a moist heat pack or a heating pad. ? Place a towel between your skin and the heat source. ? Leave the heat on for 20-30 minutes. ? Remove the heat if your skin turns bright red. This is especially important if you are unable to feel pain, heat, or cold. You may have a greater risk of getting burned.  If directed,  massage the affected area. Activity  Exercise as told by your health care provider. Gentle exercise is the best way to prevent or manage back pain.  Listen to your body when lifting. If lifting hurts, ask for help or bend your knees. This uses your leg muscles instead of your back muscles.  Squat down when picking up something from the floor. Do not bend over.  Only use bed rest for short periods as told by your health care provider. Bed rest should only be used for the most severe episodes of back pain. Standing, sitting, and lying down  Do not stand in one place for long periods of time.  Use good posture when sitting. Make sure your head rests over your shoulders and is not hanging forward. Use a pillow on your lower back if necessary.  Try sleeping on your side, preferably the left side, with a pregnancy support pillow or 1-2 regular pillows between your legs. ? If you have back pain after a night's rest, your bed may be too soft. ? A firm mattress may provide more support for your back during pregnancy. General instructions  Do not wear high heels.  Eat a healthy diet. Try to gain  weight within your health care provider's recommendations.  Use a maternity girdle, elastic sling, or back brace as told by your health care provider.  Take over-the-counter and prescription medicines only as told by your health care provider.  Work with a physical therapist or massage therapist to find ways to manage back pain. Acupuncture or massage therapy may be helpful.  Keep all follow-up visits as told by your health care provider. This is important. Contact a health care provider if:  Your back pain interferes with your daily activities.  You have increasing pain in other parts of your body. Get help right away if:  You develop numbness, tingling, weakness, or problems with the use of your arms or legs.  You develop severe back pain that is not controlled with medicine.  You have a change in bowel or bladder control.  You develop shortness of breath, dizziness, or you faint.  You develop nausea, vomiting, or sweating.  You have back pain that is a rhythmic, cramping pain similar to labor pains. Labor pain is usually 1-2 minutes apart, lasts for about 1 minute, and involves a bearing down feeling or pressure in your pelvis.  You have back pain and your water breaks or you have vaginal bleeding.  You have back pain or numbness that travels down your leg.  Your back pain developed after you fell.  You develop pain on one side of your back.  You see blood in your urine.  You develop skin blisters in the area of your back pain. Summary  Back pain may be caused by several factors that are related to changes during your pregnancy.  Follow instructions as told by your health care provider for managing pain, stiffness, and swelling.  Exercise as told by your health care provider. Gentle exercise is the best way to prevent or manage back pain.  Take over-the-counter and prescription medicines only as told by your health care provider.  Keep all follow-up visits as told  by your health care provider. This is important. This information is not intended to replace advice given to you by your health care provider. Make sure you discuss any questions you have with your health care provider. Document Revised: 07/03/2018 Document Reviewed: 08/30/2017 Elsevier Patient Education  [place].

## 2019-04-24 NOTE — MAU Provider Note (Signed)
Chief Complaint:  Back Pain and Contractions   First Provider Initiated Contact with Patient 04/24/19 0011     HPI: Najae Leavitt is a 24 y.o. [id number] at 50w1dwho presents to maternity admissions reporting contractions and low back pain  Not sure if back pain is related to contractions.  Contractions are not very painful. . She reports good fetal movement, denies LOF, vaginal bleeding, vaginal itching/burning, urinary symptoms, h/a, dizziness, n/v, diarrhea, constipation or fever/chills.  She denies headache, visual changes or RUQ abdominal pain.  Back Pain This is a new problem. The current episode started today. The problem occurs intermittently. The problem is unchanged. The pain is present in the lumbar spine. The pain does not radiate. The pain is moderate. Stiffness is present all day. Pertinent negatives include no abdominal pain, chest pain, dysuria, fever, leg pain, numbness, paresis, tingling or weakness. She has tried nothing for the symptoms.    Recently diagnosed with Covid.  RN note: Laketta Rudel is a 24 y.o. at [redacted]w[redacted]d here in MAU reporting: lower back pain that is constant that started around 1900. She states that she has had decreased fetal movement since 1830 but since she has been here she feels good fetal movement. No VB or LOF. No HA or blurred vision. Patient is a repeat c/s and was COVID + on 04/16/19.  Pain score: 5  Past Medical History: Past Medical History:  Diagnosis Date  . Hypothyroidism   . Migraine   . Thyroid disease   . UTI (lower urinary tract infection)     Past obstetric history: OB History  Gravida Para Term Preterm AB Living  4 1 1   2 1   SAB TAB Ectopic Multiple Live Births  2       1    # Outcome Date GA Lbr Len/2nd Weight Sex Delivery Anes PTL Lv  4 Current           3 Term 05/05/14 [redacted]w[redacted]d  3138 g M CS-LTranv EPI  LIV     Complications: Breech presentation  2 SAB           1 SAB             Obstetric Comments  Pt has had 2-3 miscarriages in  the past.     Past Surgical History: Past Surgical History:  Procedure Laterality Date  . APPENDECTOMY    . CESAREAN SECTION    . CHOLECYSTECTOMY N/A 11/06/2015   Procedure: LAPAROSCOPIC CHOLECYSTECTOMY;  Surgeon: Vickie Epley, MD;  Location: [facility];  Service: General;  Laterality: N/A;    Family History: Family History  Problem Relation Age of Onset  . Short stature Sister        s/p kidney failure  . Kidney disease Sister   . Deafness Sister   . Diabetes Mother        type 1 dx as young child. now with multiple complications  . Thyroid disease Mother   . Deep vein thrombosis Mother   . Cancer Maternal Grandmother        breast  . Cancer Maternal Grandfather   . Short stature Sister        paternal half sister 39'8"    Social History: Social History   Tobacco Use  . Smoking status: Never Smoker  . Smokeless tobacco: Never Used  Substance Use Topics  . Alcohol use: No  . Drug use: No    Allergies:  Allergies  Allergen Reactions  . Mushroom Extract Complex Anaphylaxis  .  Chocolate Itching  . Oxycodone Diarrhea  . Prednisone Other (See Comments)    hallucinations  . Versed [Midazolam] Hives    Hives after IV administration of zofran and versed. Unsure which medication caused reaction  . Zofran [Ondansetron Hcl] Hives    Hives after IV adminstration of zofran and versed. Unsure of which medication caused reaction.     Meds:  Medications Prior to Admission  Medication Sig Dispense Refill Last Dose  . Acetaminophen (TYLENOL PO) Take by mouth as needed.   Past Week at Unknown time  . Blood Pressure Monitor MISC For regular home bp monitoring during pregnancy 1 each 0 Past Week at Unknown time  . Cholecalciferol (VITAMIN D3) 125 MCG (5000 UT) CAPS Take 1 capsule (5,000 Units total) by mouth daily. 90 capsule 0 04/22/2019 at Unknown time  . ferrous sulfate (FERROUSUL) 325 (65 FE) MG tablet Take 1 tablet (325 mg total) by mouth daily with breakfast. 90 tablet 3  04/23/2019 at Unknown time  . levothyroxine (SYNTHROID) 150 MCG tablet Take 1 tablet (150 mcg total) by mouth daily before breakfast. 30 tablet 3 04/23/2019 at Unknown time  . Prenatal Vit-Fe Fumarate-FA (PRENATAL VITAMIN PO) Take by mouth daily.   04/22/2019 at Unknown time  . promethazine (PHENERGAN) 25 MG tablet Take 1 tablet (25 mg total) by mouth every 6 (six) hours as needed for nausea or vomiting. (Patient not taking: Reported on 04/15/2019) 30 tablet 1     I have reviewed patient's Past Medical Hx, Surgical Hx, Family Hx, Social Hx, medications and allergies.   ROS:  Review of Systems  Constitutional: Negative for fever.  Cardiovascular: Negative for chest pain.  Gastrointestinal: Negative for abdominal pain.  Genitourinary: Negative for dysuria.  Musculoskeletal: Positive for back pain.  Neurological: Negative for tingling, weakness and numbness.   Other systems negative  Physical Exam   Patient Vitals for the past 24 hrs:  BP Temp Temp src Pulse Resp SpO2 Height Weight  04/23/19 2345 110/62 -- -- 80 -- 96 % -- --  04/23/19 2315 113/62 98.3 F (36.8 C) Oral 83 19 94 % 4\' 6"  (1.372 m) 85.7 kg   Constitutional: Well-developed, well-nourished female in no acute distress.  Cardiovascular: normal rate and rhythm Respiratory: normal effort, clear to auscultation bilaterally GI: Abd soft, non-tender, gravid appropriate for gestational age.   No rebound or guarding. MS: Extremities nontender, no edema, normal ROM   Lumbar spine muscles slightly tender Neurologic: Alert and oriented x 4.  GU: Neg CVAT.  PELVIC EXAM:  Dilation: Closed Effacement (%): Thick Cervical Position: Posterior Station: Ballotable Exam by:: [employer]   FHT:  Baseline 145 , moderate variability, accelerations present, no decelerations Contractions: Irregular    Labs: Results for orders placed or performed during the hospital encounter of 04/23/19 (from the past 24 hour(s))  Urinalysis, Routine w  reflex microscopic     Status: Abnormal   Collection Time: 04/24/19 12:09 AM  Result Value Ref Range   Color, Urine YELLOW YELLOW   APPearance HAZY (A) CLEAR   Specific Gravity, Urine 1.009 1.005 - 1.030   pH 5.0 5.0 - 8.0   Glucose, UA NEGATIVE NEGATIVE mg/dL   Hgb urine dipstick NEGATIVE NEGATIVE   Bilirubin Urine NEGATIVE NEGATIVE   Ketones, ur NEGATIVE NEGATIVE mg/dL   Protein, ur NEGATIVE NEGATIVE mg/dL   Nitrite NEGATIVE NEGATIVE   Leukocytes,Ua NEGATIVE NEGATIVE   A/Positive/-- (08/10 1639)  Imaging:  No results found.  MAU Course/MDM: I have ordered labs  and reviewed results. Urine is normal without evidence of infection NST reviewed, reactive, Good FM while here Cervix unchanged.  Treatments in MAU included Flexeril   >  She got excellent relief of pain with Flexeril .    Assessment: Single IUP at [redacted]w[redacted]d Low back pain Intermittent mild contractions without cervical change Recent diagnosis of Covid  Plan: Discharge home Labor precautions and fetal kick counts Follow up in Office for prenatal visits   Encouraged to return here or to other Urgent Care/ED if she develops worsening of symptoms, increase in pain, fever, or other concerning symptoms.  Pt stable at time of discharge.  Hansel Feinstein CNM, MSN Certified Nurse-Midwife 04/24/2019 12:12 AM

## 2019-04-25 ENCOUNTER — Encounter (INDEPENDENT_AMBULATORY_CARE_PROVIDER_SITE_OTHER): Payer: Self-pay

## 2019-04-26 ENCOUNTER — Encounter (INDEPENDENT_AMBULATORY_CARE_PROVIDER_SITE_OTHER): Payer: Self-pay

## 2019-04-27 ENCOUNTER — Encounter (INDEPENDENT_AMBULATORY_CARE_PROVIDER_SITE_OTHER): Payer: Self-pay

## 2019-04-28 ENCOUNTER — Encounter (INDEPENDENT_AMBULATORY_CARE_PROVIDER_SITE_OTHER): Payer: Self-pay

## 2019-04-29 ENCOUNTER — Encounter: Payer: Self-pay | Admitting: Obstetrics & Gynecology

## 2019-04-29 ENCOUNTER — Encounter (INDEPENDENT_AMBULATORY_CARE_PROVIDER_SITE_OTHER): Payer: Self-pay

## 2019-04-29 ENCOUNTER — Other Ambulatory Visit (HOSPITAL_COMMUNITY)
Admission: RE | Admit: 2019-04-29 | Discharge: 2019-04-29 | Disposition: A | Discharge status: Home / Self Care | Payer: Medicaid Other | Source: Ambulatory Visit | Attending: Obstetrics & Gynecology | Admitting: Obstetrics & Gynecology

## 2019-04-29 ENCOUNTER — Other Ambulatory Visit: Payer: Self-pay

## 2019-04-29 ENCOUNTER — Ambulatory Visit (INDEPENDENT_AMBULATORY_CARE_PROVIDER_SITE_OTHER): Payer: Medicaid Other | Admitting: Obstetrics & Gynecology

## 2019-04-29 VITALS — BP 99/69 | HR 90 | Wt 194.0 lb

## 2019-04-29 DIAGNOSIS — Z3A36 36 weeks gestation of pregnancy: Secondary | ICD-10-CM | POA: Insufficient documentation

## 2019-04-29 DIAGNOSIS — Z3483 Encounter for supervision of other normal pregnancy, third trimester: Secondary | ICD-10-CM | POA: Diagnosis present

## 2019-04-29 LAB — POCT URINALYSIS DIPSTICK OB
Blood, UA: NEGATIVE
Glucose, UA: NEGATIVE
Ketones, UA: NEGATIVE
Leukocytes, UA: NEGATIVE
Nitrite, UA: NEGATIVE
POC,PROTEIN,UA: NEGATIVE

## 2019-04-29 NOTE — Progress Notes (Signed)
   LOW-RISK PREGNANCY VISIT Patient name: Hailey Thompson MRN [id number]  Date of birth: 1995-11-23 Chief Complaint:   Routine Prenatal Visit  History of Present Illness:   Hailey Thompson is a 24 y.o. [phone] female at [redacted]w[redacted]d with an Estimated Date of Delivery: 05/21/19 being seen today for ongoing management of a low-risk pregnancy.  Today she reports no complaints. Contractions: Not present. Vag. Bleeding: None.  Movement: Present. denies leaking of fluid. Review of Systems:   Pertinent items are noted in HPI Denies abnormal vaginal discharge w/ itching/odor/irritation, headaches, visual changes, shortness of breath, chest pain, abdominal pain, severe nausea/vomiting, or problems with urination or bowel movements unless otherwise stated above. Pertinent History Reviewed:  Reviewed past medical,surgical, social, obstetrical and family history.  Reviewed problem list, medications and allergies. Physical Assessment:   Vitals:   04/29/19 1007  BP: 99/69  Pulse: 90  Weight: 194 lb (88 kg)  Body mass index is 46.78 kg/m.        Physical Examination:   General appearance: Well appearing, and in no distress  Mental status: Alert, oriented to person, place, and time  Skin: Warm & dry  Cardiovascular: Normal heart rate noted  Respiratory: Normal respiratory effort, no distress  Abdomen: Soft, gravid, nontender  Pelvic: Cervical exam deferred         Extremities: Edema: None  Fetal Status: Fetal Heart Rate (bpm): 135 Fundal Height: 35 cm Movement: Present    Chaperone: Amanda Rash    Results for orders placed or performed in visit on 04/29/19 (from the past 24 hour(s))  POC Urinalysis Dipstick OB   Collection Time: 04/29/19 10:08 AM  Result Value Ref Range   Color, UA     Clarity, UA     Glucose, UA Negative Negative   Bilirubin, UA     Ketones, UA neg    Spec Grav, UA     Blood, UA neg    pH, UA     POC,PROTEIN,UA Negative Negative, Trace, Small (1+), Moderate (2+), Large (3+), 4+   Urobilinogen, UA     Nitrite, UA neg    Leukocytes, UA Negative Negative   Appearance     Odor      Assessment & Plan:  1) Low-risk pregnancy [id number] at [redacted]w[redacted]d with an Estimated Date of Delivery: 05/21/19   2) Previous C section for repeat, scheduled with Dr Glo Herring   Meds: No orders of the defined types were placed in this encounter.  Labs/procedures today: cultures  Plan:  Continue routine obstetrical care  Next visit: prefers in person    Reviewed: Term labor symptoms and general obstetric precautions including but not limited to vaginal bleeding, contractions, leaking of fluid and fetal movement were reviewed in detail with the patient.  All questions were answered. Hash[place] bp cuff. Rx faxed to . Check bp weekly, let us know if >140/90.   Follow-up: Return in about 10 days (around 05/09/2019) for [place].  Orders Placed This Encounter  Procedures  . GC/Chlamydia Probe Amp  . Culture, beta strep (group b only)  . POC Urinalysis Dipstick OB   Florian Buff  04/29/2019 10:26 AM

## 2019-04-29 NOTE — Addendum Note (Signed)
Addended by: Christiana Pellant A on: 04/29/2019 11:54 AM   Modules accepted: Orders

## 2019-04-30 ENCOUNTER — Encounter (INDEPENDENT_AMBULATORY_CARE_PROVIDER_SITE_OTHER): Payer: Self-pay

## 2019-05-01 ENCOUNTER — Encounter (INDEPENDENT_AMBULATORY_CARE_PROVIDER_SITE_OTHER): Payer: Self-pay

## 2019-05-02 ENCOUNTER — Encounter (INDEPENDENT_AMBULATORY_CARE_PROVIDER_SITE_OTHER): Payer: Self-pay

## 2019-05-02 ENCOUNTER — Encounter (HOSPITAL_COMMUNITY): Payer: Self-pay

## 2019-05-02 LAB — CULTURE, BETA STREP (GROUP B ONLY): Strep Gp B Culture: POSITIVE — AB

## 2019-05-02 LAB — CERVICOVAGINAL ANCILLARY ONLY
Chlamydia: NEGATIVE
Comment: NEGATIVE
Comment: NORMAL
Neisseria Gonorrhea: NEGATIVE

## 2019-05-02 NOTE — Patient Instructions (Signed)
Hailey Thompson  05/02/2019   Your procedure is scheduled on:  05/14/2019  Arrive at 0800 at [facility] on [address] at [address]  and [address]. You are invited to use the FREE valet parking or use the Visitor's parking deck.  Pick up the phone at the desk and dial [phone].  Call this number if you have problems the morning of surgery: [phone]  Remember:   Do not eat food:(After Midnight) Desps de medianoche.  Do not drink clear liquids: (After Midnight) Desps de medianoche.  Take these medicines the morning of surgery with A SIP OF WATER:  synthroid   Do not wear jewelry, make-up or nail polish.  Do not wear lotions, powders, or perfumes. Do not wear deodorant.  Do not shave 48 hours prior to surgery.  Do not bring valuables to the hospital.  [facility] is not   responsible for any belongings or valuables brought to the hospital.  Contacts, dentures or bridgework may not be worn into surgery.  Leave suitcase in the car. After surgery it may be brought to your room.  For patients admitted to the hospital, checkout time is 11:00 AM the day of              discharge.      Please read over the following fact sheets that you were given:     Preparing for Surgery

## 2019-05-03 ENCOUNTER — Encounter (INDEPENDENT_AMBULATORY_CARE_PROVIDER_SITE_OTHER): Payer: Self-pay

## 2019-05-04 ENCOUNTER — Encounter (INDEPENDENT_AMBULATORY_CARE_PROVIDER_SITE_OTHER): Payer: Self-pay

## 2019-05-06 ENCOUNTER — Encounter (INDEPENDENT_AMBULATORY_CARE_PROVIDER_SITE_OTHER): Payer: Self-pay

## 2019-05-07 ENCOUNTER — Encounter (HOSPITAL_COMMUNITY): Payer: Self-pay | Admitting: Obstetrics and Gynecology

## 2019-05-07 ENCOUNTER — Inpatient Hospital Stay (HOSPITAL_COMMUNITY)
Admission: AD | Admit: 2019-05-07 | Discharge: 2019-05-07 | Disposition: A | Discharge status: Home / Self Care | Payer: Medicaid Other | Attending: Obstetrics and Gynecology | Admitting: Obstetrics and Gynecology

## 2019-05-07 ENCOUNTER — Other Ambulatory Visit: Payer: Self-pay

## 2019-05-07 ENCOUNTER — Encounter (INDEPENDENT_AMBULATORY_CARE_PROVIDER_SITE_OTHER): Payer: Self-pay

## 2019-05-07 DIAGNOSIS — Z3A38 38 weeks gestation of pregnancy: Secondary | ICD-10-CM | POA: Diagnosis not present

## 2019-05-07 DIAGNOSIS — Z0371 Encounter for suspected problem with amniotic cavity and membrane ruled out: Secondary | ICD-10-CM | POA: Diagnosis not present

## 2019-05-07 DIAGNOSIS — Z3483 Encounter for supervision of other normal pregnancy, third trimester: Secondary | ICD-10-CM

## 2019-05-07 DIAGNOSIS — N898 Other specified noninflammatory disorders of vagina: Secondary | ICD-10-CM | POA: Diagnosis present

## 2019-05-07 DIAGNOSIS — Z3689 Encounter for other specified antenatal screening: Secondary | ICD-10-CM

## 2019-05-07 LAB — POCT FERN TEST: POCT Fern Test: NEGATIVE

## 2019-05-07 LAB — AMNISURE RUPTURE OF MEMBRANE (ROM) NOT AT ARMC: Amnisure ROM: NEGATIVE

## 2019-05-07 NOTE — MAU Note (Signed)
Informed provider that patient made no cervical change on recheck.

## 2019-05-07 NOTE — MAU Provider Note (Signed)
S: Ms. Hailey Thompson is a 24 y.o. [phone] at [redacted]w[redacted]d  who presents to MAU today complaining of leaking of fluid since 1100. She denies vaginal bleeding. She endorses contractions. She reports normal fetal movement.    O: BP 114/73   Pulse 97   Temp 98.4 F (36.9 C) (Oral)   Resp 16   Ht 4\' 6"  (1.372 m)   Wt 88.2 kg   LMP 08/14/2018   SpO2 96%   BMI 46.90 kg/m  GENERAL: Well-developed, well-nourished female in no acute distress.  HEAD: Normocephalic, atraumatic.  CHEST: Normal effort of breathing, regular heart rate ABDOMEN: Soft, nontender, gravid PELVIC: Normal external female genitalia. Vagina is pink and rugated. Cervix with normal contour, no lesions. Normal discharge. Negative pooling.   Cervical exam:  Dilation: Closed Effacement (%): Thick Exam by:: Sunday Corn CNM   Fetal Monitoring: Baseline: 125 Variability: moderate Accelerations: present Decelerations: absent Contractions: irregular every 2-6 mins  Results for orders placed or performed during the hospital encounter of 05/07/19 (from the past 24 hour(s))  POCT fern test     Status: Normal   Collection Time: 05/07/19  2:11 PM  Result Value Ref Range   POCT Fern Test Negative = intact amniotic membranes   Amnisure rupture of membrane (rom)not at [facility]     Status: None   Collection Time: 05/07/19  3:26 PM  Result Value Ref Range   Amnisure ROM NEGATIVE     A: SIUP at [redacted]w[redacted]d  Membranes intact  P: No leakage of amniotic fluid into vagina - Reassurance given that membranes are not ruptured - Advised to return to MAU, if she thinks her water breaks again  NST (non-stress test) reactive Reassurance given that NST is normal  - Discharge patient - Keep scheduled appt with [facility] - Patient verbalized an understanding of the plan of care and agrees.    Laury Deep, CNM  05/07/2019 3:21 PM

## 2019-05-07 NOTE — MAU Note (Signed)
Pt states that she felt some clear fluid gushing, with some white discharge around 1100; happened about 1/2 hour ago again. Has been feeling contractions about one hour ago -4-5 mins apart. Denies VB. Decreased fetal movement this morning.

## 2019-05-09 ENCOUNTER — Ambulatory Visit (INDEPENDENT_AMBULATORY_CARE_PROVIDER_SITE_OTHER): Payer: Medicaid Other | Admitting: Obstetrics and Gynecology

## 2019-05-09 ENCOUNTER — Encounter: Payer: Self-pay | Admitting: Obstetrics and Gynecology

## 2019-05-09 ENCOUNTER — Other Ambulatory Visit: Payer: Self-pay | Admitting: Obstetrics and Gynecology

## 2019-05-09 ENCOUNTER — Other Ambulatory Visit: Payer: Self-pay

## 2019-05-09 VITALS — BP 113/77 | HR 76 | Wt 194.4 lb

## 2019-05-09 DIAGNOSIS — Z331 Pregnant state, incidental: Secondary | ICD-10-CM

## 2019-05-09 DIAGNOSIS — Z1389 Encounter for screening for other disorder: Secondary | ICD-10-CM

## 2019-05-09 DIAGNOSIS — Z3483 Encounter for supervision of other normal pregnancy, third trimester: Secondary | ICD-10-CM

## 2019-05-09 DIAGNOSIS — Z3A38 38 weeks gestation of pregnancy: Secondary | ICD-10-CM

## 2019-05-09 LAB — POCT URINALYSIS DIPSTICK OB
Blood, UA: NEGATIVE
Glucose, UA: NEGATIVE
Ketones, UA: NEGATIVE
Nitrite, UA: NEGATIVE
POC,PROTEIN,UA: NEGATIVE

## 2019-05-09 NOTE — Progress Notes (Addendum)
Patient ID: Hailey Thompson, female   DOB: May 20, 1995, 24 y.o.   MRN: [id number]    LOW-RISK PREGNANCY VISIT Patient name: Hailey Thompson MRN [id number]  Date of birth: 01/01/1996 Chief Complaint:   Routine Prenatal Visit (+ pressure and contractions)  History of Present Illness:   Hailey Thompson is a 24 y.o. [phone] female at [redacted]w[redacted]d with an Estimated Date of Delivery: 05/21/19 being seen today for ongoing management of a low-risk pregnancy. She will be a repeat C-section. She had 3 seromas appear after her first C-section [facility]) that healed with topical tx. 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  We have discussed the pros and cons of wide excision of the old incision with removal of the inferior portions of the pannus in order to improve wound care.  Risks of infection seroma etc. have been discussed.  We will consider a vacuum dressing on the incision Today she reports no complaints. Contractions: Irregular. Vag. Bleeding: None.  Movement: Present. denies leaking of fluid. Review of Systems:   Pertinent items are noted in HPI Denies abnormal vaginal discharge w/ itching/odor/irritation, headaches, visual changes, shortness of breath, chest pain, abdominal pain, severe nausea/vomiting, or problems with urination or bowel movements unless otherwise stated above. Pertinent History Reviewed:  Reviewed past medical,surgical, social, obstetrical and family history.  Reviewed problem list, medications and allergies. Physical Assessment:   Vitals:   05/09/19 1430  BP: 113/77  Pulse: 76  Weight: 194 lb 6.4 oz (88.2 kg)  Body mass index is 46.87 kg/m.        Physical Examination:   General appearance: Well appearing, and in no distress  Mental status: Alert, oriented to person, place, and time  Skin: Warm & dry  Cardiovascular: Normal heart rate noted  Respiratory: Normal respiratory effort, no distress  Abdomen: Soft, gravid, nontender short stature with pannus that ends  on each side medial to anterior superior iliac crest.  Well-healed prior scar but it is trapped underneath the pannus  Pelvic: Cervical exam deferred         Extremities: Edema: None  Fetal Status: Fetal Heart Rate (bpm): 129 Fundal Height: 36 cm Movement: Present    Chaperone: Samul Dada    Results for orders placed or performed in visit on 05/09/19 (from the past 24 hour(s))  POC Urinalysis Dipstick OB   Collection Time: 05/09/19  2:32 PM  Result Value Ref Range   Color, UA     Clarity, UA     Glucose, UA Negative Negative   Bilirubin, UA     Ketones, UA neg    Spec Grav, UA     Blood, UA neg    pH, UA     POC,PROTEIN,UA Negative Negative, Trace, Small (1+), Moderate (2+), Large (3+), 4+   Urobilinogen, UA     Nitrite, UA neg    Leukocytes, UA Trace (A) Negative   Appearance     Odor      Assessment & Plan:  1) Low-risk pregnancy [id number] at [redacted]w[redacted]d with an Estimated Date of Delivery: 05/21/19  2) previous cesarean section not for trial of labor, scheduled for repeat section next Tuesday 2/ 16 2020 9:30 AM, with wide excision of cicatrix 3) Anemia, improving   Meds: No orders of the defined types were placed in this encounter.  Labs/procedures today: None  Plan:  Continue routine obstetrical care, repeat C-section on 05/14/2019; wound vacuum to use to decrease seromas  Next visit:  will be in  person for c-section post-op    Reviewed: Term labor symptoms and general obstetric precautions including but not limited to vaginal bleeding, contractions, leaking of fluid and fetal movement were reviewed in detail with the patient.  All questions were answered.   Follow-up: Return in about 12 days (around 05/21/2019) for Post-OP. inciison check  Orders Placed This Encounter  Procedures  . POC Urinalysis Dipstick OB   By signing my name below, I, Samul Dada, attest that this documentation has been prepared under the direction and in the presence of Jonnie Kind,  MD. Electronically Signed: [place]. 05/09/19. 2:56 PM.  I personally performed the services described in this documentation, which was SCRIBED in my presence. The recorded information has been reviewed and considered accurate. It has been edited as necessary during review. Jonnie Kind, MD

## 2019-05-12 ENCOUNTER — Other Ambulatory Visit: Payer: Self-pay

## 2019-05-12 ENCOUNTER — Other Ambulatory Visit (HOSPITAL_COMMUNITY)
Admission: RE | Admit: 2019-05-12 | Discharge: 2019-05-12 | Disposition: A | Discharge status: Home / Self Care | Payer: Medicaid Other | Source: Ambulatory Visit | Attending: Obstetrics & Gynecology | Admitting: Obstetrics & Gynecology

## 2019-05-12 DIAGNOSIS — Z01812 Encounter for preprocedural laboratory examination: Secondary | ICD-10-CM | POA: Diagnosis present

## 2019-05-12 LAB — COMPREHENSIVE METABOLIC PANEL
ALT: 22 U/L (ref 0–44)
AST: 26 U/L (ref 15–41)
Albumin: 1.9 g/dL — ABNORMAL LOW (ref 3.5–5.0)
Alkaline Phosphatase: 138 U/L — ABNORMAL HIGH (ref 38–126)
Anion gap: 9 (ref 5–15)
BUN: 10 mg/dL (ref 6–20)
CO2: 20 mmol/L — ABNORMAL LOW (ref 22–32)
Calcium: 8.4 mg/dL — ABNORMAL LOW (ref 8.9–10.3)
Chloride: 107 mmol/L (ref 98–111)
Creatinine, Ser: 0.94 mg/dL (ref 0.44–1.00)
GFR calc Af Amer: >60 mL/min (ref >60)
GFR calc non Af Amer: >60 mL/min (ref >60)
Glucose, Bld: 86 mg/dL (ref 70–99)
Potassium: 3.9 mmol/L (ref 3.5–5.1)
Sodium: 136 mmol/L (ref 135–145)
Total Bilirubin: 0.5 mg/dL (ref 0.3–1.2)
Total Protein: 6.5 g/dL (ref 6.5–8.1)

## 2019-05-12 LAB — CBC
HCT: 36 % (ref 36.0–46.0)
Hemoglobin: 11.9 g/dL — ABNORMAL LOW (ref 12.0–15.0)
MCH: 30.3 pg (ref 26.0–34.0)
MCHC: 33.1 g/dL (ref 30.0–36.0)
MCV: 91.6 fL (ref 80.0–100.0)
Platelets: 171 10*3/uL (ref 150–400)
RBC: 3.93 MIL/uL (ref 3.87–5.11)
RDW: 13.5 % (ref 11.5–15.5)
WBC: 9.9 10*3/uL (ref 4.0–10.5)
nRBC: 0 % (ref 0.0–0.2)

## 2019-05-12 LAB — RPR: RPR Ser Ql: NONREACTIVE

## 2019-05-12 LAB — ABO/RH: ABO/RH(D): A POS

## 2019-05-12 NOTE — MAU Note (Signed)
Pt here for PAT lab draw. + Covid 04/16/19 so does not need to be retested today (confirmed with [facility]). Denies new symptoms.

## 2019-05-13 LAB — TYPE AND SCREEN
ABO/RH(D): A POS
Antibody Screen: NEGATIVE

## 2019-05-13 NOTE — Anesthesia Preprocedure Evaluation (Addendum)
Anesthesia Evaluation  Patient identified by MRN, date of birth, ID band Patient awake    Reviewed: Allergy & Precautions, NPO status , Patient's Chart, lab work & pertinent test results  Airway Mallampati: II  TM Distance: >3 FB     Dental  (+) Poor Dentition, Chipped, Dental Advisory Given,    Pulmonary neg pulmonary ROS,    breath sounds clear to auscultation       Cardiovascular negative cardio ROS   Rhythm:Regular Rate:Normal     Neuro/Psych  Headaches,    GI/Hepatic negative GI ROS, Neg liver ROS,   Endo/Other  Hypothyroidism Morbid obesityBMI 47  Renal/GU negative Renal ROS     Musculoskeletal negative musculoskeletal ROS (+)   Abdominal   Peds  Hematology  (+) Blood dyscrasia, anemia , Plt 171, hct 36   Anesthesia Other Findings   Reproductive/Obstetrics (+) Pregnancy 1 prior C section                            Anesthesia Physical  Anesthesia Plan  ASA: III  Anesthesia Plan: Spinal   Post-op Pain Management:    Induction:   PONV Risk Score and Plan: 2 and Dexamethasone, Treatment may vary due to age or medical condition and Scopolamine patch - Pre-op  Airway Management Planned: Natural Airway and Simple Face Mask  Additional Equipment: None  Intra-op Plan:   Post-operative Plan:   Informed Consent: I have reviewed the patients History and Physical, chart, labs and discussed the procedure including the risks, benefits and alternatives for the proposed anesthesia with the patient or authorized representative who has indicated his/her understanding and acceptance.     Dental advisory given  Plan Discussed with: CRNA  Anesthesia Plan Comments:        Anesthesia Quick Evaluation

## 2019-05-14 ENCOUNTER — Inpatient Hospital Stay (HOSPITAL_COMMUNITY)
Admission: RE | Admit: 2019-05-14 | Discharge: 2019-05-16 | DRG: 788 | Disposition: A | Discharge status: Home / Self Care | Payer: Medicaid Other | Attending: Obstetrics and Gynecology | Admitting: Obstetrics and Gynecology

## 2019-05-14 ENCOUNTER — Encounter (HOSPITAL_COMMUNITY)
Admission: RE | Disposition: A | Discharge status: Home / Self Care | Payer: Self-pay | Source: Home / Self Care | Attending: Obstetrics and Gynecology

## 2019-05-14 ENCOUNTER — Inpatient Hospital Stay (HOSPITAL_COMMUNITY): Payer: Medicaid Other | Admitting: Anesthesiology

## 2019-05-14 ENCOUNTER — Encounter (HOSPITAL_COMMUNITY): Payer: Self-pay | Admitting: Obstetrics and Gynecology

## 2019-05-14 ENCOUNTER — Other Ambulatory Visit: Payer: Self-pay

## 2019-05-14 DIAGNOSIS — O34211 Maternal care for low transverse scar from previous cesarean delivery: Secondary | ICD-10-CM | POA: Diagnosis present

## 2019-05-14 DIAGNOSIS — E669 Obesity, unspecified: Secondary | ICD-10-CM | POA: Diagnosis present

## 2019-05-14 DIAGNOSIS — O9902 Anemia complicating childbirth: Secondary | ICD-10-CM | POA: Diagnosis present

## 2019-05-14 DIAGNOSIS — Z8616 Personal history of COVID-19: Secondary | ICD-10-CM

## 2019-05-14 DIAGNOSIS — E039 Hypothyroidism, unspecified: Secondary | ICD-10-CM | POA: Diagnosis present

## 2019-05-14 DIAGNOSIS — O99214 Obesity complicating childbirth: Secondary | ICD-10-CM | POA: Diagnosis present

## 2019-05-14 DIAGNOSIS — O99019 Anemia complicating pregnancy, unspecified trimester: Secondary | ICD-10-CM | POA: Diagnosis present

## 2019-05-14 DIAGNOSIS — O99284 Endocrine, nutritional and metabolic diseases complicating childbirth: Secondary | ICD-10-CM | POA: Diagnosis present

## 2019-05-14 DIAGNOSIS — Z98891 History of uterine scar from previous surgery: Secondary | ICD-10-CM

## 2019-05-14 DIAGNOSIS — O99824 Streptococcus B carrier state complicating childbirth: Secondary | ICD-10-CM | POA: Diagnosis present

## 2019-05-14 DIAGNOSIS — D649 Anemia, unspecified: Secondary | ICD-10-CM | POA: Diagnosis present

## 2019-05-14 DIAGNOSIS — Z3A39 39 weeks gestation of pregnancy: Secondary | ICD-10-CM

## 2019-05-14 LAB — CBC
HCT: 36.7 % (ref 36.0–46.0)
Hemoglobin: 12.1 g/dL (ref 12.0–15.0)
MCH: 30 pg (ref 26.0–34.0)
MCHC: 33 g/dL (ref 30.0–36.0)
MCV: 90.8 fL (ref 80.0–100.0)
Platelets: 188 10*3/uL (ref 150–400)
RBC: 4.04 MIL/uL (ref 3.87–5.11)
RDW: 13.5 % (ref 11.5–15.5)
WBC: 16 10*3/uL — ABNORMAL HIGH (ref 4.0–10.5)
nRBC: 0 % (ref 0.0–0.2)

## 2019-05-14 LAB — CREATININE, SERUM
Creatinine, Ser: 0.82 mg/dL (ref 0.44–1.00)
GFR calc Af Amer: >60 mL/min (ref >60)
GFR calc non Af Amer: >60 mL/min (ref >60)

## 2019-05-14 SURGERY — Surgical Case
Anesthesia: Spinal | Wound class: Clean Contaminated

## 2019-05-14 MED ORDER — DIPHENHYDRAMINE HCL 25 MG PO CAPS: 25.0000 mg | ORAL_CAPSULE | ORAL | Status: DC | PRN

## 2019-05-14 MED ORDER — LACTATED RINGERS IV BOLUS
500.0000 mL | Freq: Once | INTRAVENOUS | Status: AC
  Administered 2019-05-14: 500 mL via INTRAVENOUS

## 2019-05-14 MED ORDER — WITCH HAZEL-GLYCERIN EX PADS: 1.0000 "application " | MEDICATED_PAD | CUTANEOUS | Status: DC | PRN

## 2019-05-14 MED ORDER — NALBUPHINE HCL 10 MG/ML IJ SOLN: 5.0000 mg | INTRAMUSCULAR | Status: DC | PRN

## 2019-05-14 MED ORDER — DIBUCAINE (PERIANAL) 1 % EX OINT: 1.0000 "application " | TOPICAL_OINTMENT | CUTANEOUS | Status: DC | PRN

## 2019-05-14 MED ORDER — ENOXAPARIN SODIUM 40 MG/0.4ML ~~LOC~~ SOLN
40.0000 mg | SUBCUTANEOUS | Status: DC
  Administered 2019-05-15: 40 mg via SUBCUTANEOUS
  Filled 2019-05-14: qty 0.4

## 2019-05-14 MED ORDER — CEFAZOLIN SODIUM-DEXTROSE 2-4 GM/100ML-% IV SOLN
INTRAVENOUS | Status: AC
  Filled 2019-05-14: qty 100

## 2019-05-14 MED ORDER — OXYCODONE HCL 5 MG PO TABS: 5.0000 mg | ORAL_TABLET | ORAL | Status: DC | PRN

## 2019-05-14 MED ORDER — LACTATED RINGERS IV SOLN: INTRAVENOUS | Status: DC

## 2019-05-14 MED ORDER — SODIUM CHLORIDE 0.9 % IR SOLN
Status: DC | PRN
  Administered 2019-05-14: 1

## 2019-05-14 MED ORDER — KETOROLAC TROMETHAMINE 30 MG/ML IJ SOLN
30.0000 mg | Freq: Four times a day (QID) | INTRAMUSCULAR | Status: AC | PRN
  Administered 2019-05-14: 30 mg via INTRAMUSCULAR

## 2019-05-14 MED ORDER — MENTHOL 3 MG MT LOZG: 1.0000 | LOZENGE | OROMUCOSAL | Status: DC | PRN

## 2019-05-14 MED ORDER — HYDROCODONE-ACETAMINOPHEN 5-325 MG PO TABS
1.0000 | ORAL_TABLET | ORAL | Status: DC | PRN
  Administered 2019-05-15 – 2019-05-16 (×4): 1 via ORAL
  Filled 2019-05-14 (×4): qty 1

## 2019-05-14 MED ORDER — SCOPOLAMINE 1 MG/3DAYS TD PT72
MEDICATED_PATCH | TRANSDERMAL | Status: AC
  Filled 2019-05-14: qty 1

## 2019-05-14 MED ORDER — CEFAZOLIN SODIUM-DEXTROSE 2-4 GM/100ML-% IV SOLN: 2.0000 g | INTRAVENOUS | Status: DC

## 2019-05-14 MED ORDER — SIMETHICONE 80 MG PO CHEW: 80.0000 mg | CHEWABLE_TABLET | ORAL | Status: DC | PRN

## 2019-05-14 MED ORDER — FENTANYL CITRATE (PF) 100 MCG/2ML IJ SOLN
INTRAMUSCULAR | Status: AC
  Filled 2019-05-14: qty 2

## 2019-05-14 MED ORDER — SIMETHICONE 80 MG PO CHEW
80.0000 mg | CHEWABLE_TABLET | Freq: Three times a day (TID) | ORAL | Status: DC
  Administered 2019-05-14 – 2019-05-16 (×5): 80 mg via ORAL
  Filled 2019-05-14 (×4): qty 1

## 2019-05-14 MED ORDER — GABAPENTIN 300 MG PO CAPS
300.0000 mg | ORAL_CAPSULE | Freq: Once | ORAL | Status: AC
  Administered 2019-05-14: 300 mg via ORAL
  Filled 2019-05-14: qty 3
  Filled 2019-05-14: qty 1

## 2019-05-14 MED ORDER — DEXAMETHASONE SODIUM PHOSPHATE 4 MG/ML IJ SOLN
INTRAMUSCULAR | Status: DC | PRN
  Administered 2019-05-14: 8 mg via INTRAVENOUS

## 2019-05-14 MED ORDER — SENNOSIDES-DOCUSATE SODIUM 8.6-50 MG PO TABS
2.0000 | ORAL_TABLET | ORAL | Status: DC
  Administered 2019-05-14 – 2019-05-15 (×2): 2 via ORAL
  Filled 2019-05-14 (×2): qty 2

## 2019-05-14 MED ORDER — OXYTOCIN 40 UNITS IN NORMAL SALINE INFUSION - SIMPLE MED
INTRAVENOUS | Status: DC | PRN
  Administered 2019-05-14: 200 mL via INTRAVENOUS

## 2019-05-14 MED ORDER — OXYTOCIN 40 UNITS IN NORMAL SALINE INFUSION - SIMPLE MED
INTRAVENOUS | Status: AC
  Filled 2019-05-14: qty 1000

## 2019-05-14 MED ORDER — NALOXONE HCL 4 MG/10ML IJ SOLN
1.0000 ug/kg/h | INTRAVENOUS | Status: DC | PRN
  Filled 2019-05-14: qty 5

## 2019-05-14 MED ORDER — NALOXONE HCL 0.4 MG/ML IJ SOLN: 0.4000 mg | INTRAMUSCULAR | Status: DC | PRN

## 2019-05-14 MED ORDER — NALBUPHINE HCL 10 MG/ML IJ SOLN: 5.0000 mg | Freq: Once | INTRAMUSCULAR | Status: DC | PRN

## 2019-05-14 MED ORDER — FENTANYL CITRATE (PF) 100 MCG/2ML IJ SOLN
25.0000 ug | INTRAMUSCULAR | Status: DC | PRN
  Administered 2019-05-14: 50 ug via INTRAVENOUS

## 2019-05-14 MED ORDER — COCONUT OIL OIL
1.0000 "application " | TOPICAL_OIL | Status: DC | PRN
  Administered 2019-05-14: 1 via TOPICAL

## 2019-05-14 MED ORDER — ACETAMINOPHEN 325 MG PO TABS
650.0000 mg | ORAL_TABLET | Freq: Four times a day (QID) | ORAL | Status: DC | PRN
  Administered 2019-05-16: 650 mg via ORAL
  Filled 2019-05-14: qty 2

## 2019-05-14 MED ORDER — KETOROLAC TROMETHAMINE 30 MG/ML IJ SOLN
30.0000 mg | Freq: Four times a day (QID) | INTRAMUSCULAR | Status: AC
  Administered 2019-05-14 – 2019-05-15 (×2): 30 mg via INTRAVENOUS
  Filled 2019-05-14 (×3): qty 1

## 2019-05-14 MED ORDER — PHENYLEPHRINE 40 MCG/ML (10ML) SYRINGE FOR IV PUSH (FOR BLOOD PRESSURE SUPPORT)
PREFILLED_SYRINGE | INTRAVENOUS | Status: AC
  Filled 2019-05-14: qty 10

## 2019-05-14 MED ORDER — STERILE WATER FOR IRRIGATION IR SOLN
Status: DC | PRN
  Administered 2019-05-14: 1000 mL

## 2019-05-14 MED ORDER — SIMETHICONE 80 MG PO CHEW
80.0000 mg | CHEWABLE_TABLET | ORAL | Status: DC
  Administered 2019-05-14 – 2019-05-15 (×2): 80 mg via ORAL
  Filled 2019-05-14 (×2): qty 1

## 2019-05-14 MED ORDER — MORPHINE SULFATE (PF) 0.5 MG/ML IJ SOLN
INTRAMUSCULAR | Status: AC
  Filled 2019-05-14: qty 10

## 2019-05-14 MED ORDER — DIPHENHYDRAMINE HCL 50 MG/ML IJ SOLN: 12.5000 mg | INTRAMUSCULAR | Status: DC | PRN

## 2019-05-14 MED ORDER — KETOROLAC TROMETHAMINE 30 MG/ML IJ SOLN
30.0000 mg | Freq: Four times a day (QID) | INTRAMUSCULAR | Status: AC | PRN
  Administered 2019-05-14: 30 mg via INTRAVENOUS

## 2019-05-14 MED ORDER — FAMOTIDINE 20 MG PO TABS: 20.0000 mg | ORAL_TABLET | Freq: Once | ORAL | Status: DC

## 2019-05-14 MED ORDER — SCOPOLAMINE 1 MG/3DAYS TD PT72: 1.0000 | MEDICATED_PATCH | Freq: Once | TRANSDERMAL | Status: DC

## 2019-05-14 MED ORDER — DEXAMETHASONE SODIUM PHOSPHATE 4 MG/ML IJ SOLN
INTRAMUSCULAR | Status: AC
  Filled 2019-05-14: qty 1

## 2019-05-14 MED ORDER — SCOPOLAMINE 1 MG/3DAYS TD PT72
1.0000 | MEDICATED_PATCH | Freq: Once | TRANSDERMAL | Status: DC
  Administered 2019-05-14: 1.5 mg via TRANSDERMAL

## 2019-05-14 MED ORDER — FENTANYL CITRATE (PF) 100 MCG/2ML IJ SOLN
INTRAMUSCULAR | Status: DC | PRN
  Administered 2019-05-14: 15 ug via INTRATHECAL

## 2019-05-14 MED ORDER — HYDROMORPHONE HCL 1 MG/ML IJ SOLN
0.5000 mg | INTRAMUSCULAR | Status: DC | PRN
  Administered 2019-05-14: 0.5 mg via INTRAVENOUS
  Filled 2019-05-14: qty 1

## 2019-05-14 MED ORDER — ZOLPIDEM TARTRATE 5 MG PO TABS: 5.0000 mg | ORAL_TABLET | Freq: Every evening | ORAL | Status: DC | PRN

## 2019-05-14 MED ORDER — PRENATAL MULTIVITAMIN CH
1.0000 | ORAL_TABLET | Freq: Every day | ORAL | Status: DC
  Administered 2019-05-15: 1 via ORAL
  Filled 2019-05-14: qty 1

## 2019-05-14 MED ORDER — PHENYLEPHRINE HCL-NACL 20-0.9 MG/250ML-% IV SOLN
INTRAVENOUS | Status: DC | PRN
  Administered 2019-05-14: 60 ug/min via INTRAVENOUS

## 2019-05-14 MED ORDER — DIPHENHYDRAMINE HCL 25 MG PO CAPS: 25.0000 mg | ORAL_CAPSULE | Freq: Four times a day (QID) | ORAL | Status: DC | PRN

## 2019-05-14 MED ORDER — SOD CITRATE-CITRIC ACID 500-334 MG/5ML PO SOLN
30.0000 mL | ORAL | Status: AC
  Administered 2019-05-14: 30 mL via ORAL

## 2019-05-14 MED ORDER — IBUPROFEN 800 MG PO TABS
800.0000 mg | ORAL_TABLET | Freq: Four times a day (QID) | ORAL | Status: DC
  Administered 2019-05-15 – 2019-05-16 (×4): 800 mg via ORAL
  Filled 2019-05-14 (×4): qty 1

## 2019-05-14 MED ORDER — SOD CITRATE-CITRIC ACID 500-334 MG/5ML PO SOLN
ORAL | Status: AC
  Filled 2019-05-14: qty 30

## 2019-05-14 MED ORDER — SODIUM CHLORIDE 0.9% FLUSH: 3.0000 mL | INTRAVENOUS | Status: DC | PRN

## 2019-05-14 MED ORDER — TETANUS-DIPHTH-ACELL PERTUSSIS 5-2.5-18.5 LF-MCG/0.5 IM SUSP: 0.5000 mL | Freq: Once | INTRAMUSCULAR | Status: DC

## 2019-05-14 MED ORDER — CEFAZOLIN SODIUM-DEXTROSE 2-3 GM-%(50ML) IV SOLR
INTRAVENOUS | Status: DC | PRN
  Administered 2019-05-14: 2 g via INTRAVENOUS

## 2019-05-14 MED ORDER — ACETAMINOPHEN 500 MG PO TABS
1000.0000 mg | ORAL_TABLET | Freq: Four times a day (QID) | ORAL | Status: AC
  Administered 2019-05-14 – 2019-05-15 (×4): 1000 mg via ORAL
  Filled 2019-05-14 (×4): qty 2

## 2019-05-14 MED ORDER — BUPIVACAINE IN DEXTROSE 0.75-8.25 % IT SOLN
INTRATHECAL | Status: DC | PRN
  Administered 2019-05-14: 1.2 mL via INTRATHECAL

## 2019-05-14 MED ORDER — OXYTOCIN 40 UNITS IN NORMAL SALINE INFUSION - SIMPLE MED: 2.5000 [IU]/h | INTRAVENOUS | Status: AC

## 2019-05-14 MED ORDER — MORPHINE SULFATE (PF) 0.5 MG/ML IJ SOLN
INTRAMUSCULAR | Status: DC | PRN
  Administered 2019-05-14: .15 mg via INTRATHECAL

## 2019-05-14 MED ORDER — KETOROLAC TROMETHAMINE 30 MG/ML IJ SOLN
INTRAMUSCULAR | Status: AC
  Filled 2019-05-14: qty 1

## 2019-05-14 MED ORDER — PROMETHAZINE HCL 25 MG/ML IJ SOLN: 6.2500 mg | INTRAMUSCULAR | Status: DC | PRN

## 2019-05-14 MED ORDER — PHENYLEPHRINE HCL-NACL 20-0.9 MG/250ML-% IV SOLN
INTRAVENOUS | Status: AC
  Filled 2019-05-14: qty 250

## 2019-05-14 MED ORDER — MEPERIDINE HCL 25 MG/ML IJ SOLN: 6.2500 mg | INTRAMUSCULAR | Status: DC | PRN

## 2019-05-14 MED ORDER — LEVOTHYROXINE SODIUM 75 MCG PO TABS
150.0000 ug | ORAL_TABLET | Freq: Every day | ORAL | Status: DC
  Administered 2019-05-15 – 2019-05-16 (×2): 150 ug via ORAL
  Filled 2019-05-14 (×2): qty 2

## 2019-05-14 SURGICAL SUPPLY — 43 items
BENZOIN TINCTURE PRP APPL 2/3 (GAUZE/BANDAGES/DRESSINGS) ×3 IMPLANT
CHLORAPREP W/TINT 26ML (MISCELLANEOUS) ×3 IMPLANT
CLAMP CORD UMBIL (MISCELLANEOUS) IMPLANT
CLOSURE STERI-STRIP 1/2X4 (GAUZE/BANDAGES/DRESSINGS) ×1
CLOSURE WOUND 1/2 X4 (GAUZE/BANDAGES/DRESSINGS)
CLOTH BEACON ORANGE TIMEOUT ST (SAFETY) ×3 IMPLANT
CLSR STERI-STRIP ANTIMIC 1/2X4 (GAUZE/BANDAGES/DRESSINGS) ×2 IMPLANT
DRSG OPSITE POSTOP 4X10 (GAUZE/BANDAGES/DRESSINGS) ×3 IMPLANT
ELECT REM PT RETURN 9FT ADLT (ELECTROSURGICAL) ×3
ELECTRODE REM PT RTRN 9FT ADLT (ELECTROSURGICAL) ×1 IMPLANT
EXTRACTOR VACUUM KIWI (MISCELLANEOUS) IMPLANT
GAUZE SPONGE 4X4 12PLY STRL LF (GAUZE/BANDAGES/DRESSINGS) ×6 IMPLANT
GLOVE BIOGEL PI IND STRL 7.0 (GLOVE) ×1 IMPLANT
GLOVE BIOGEL PI IND STRL 9 (GLOVE) ×1 IMPLANT
GLOVE BIOGEL PI INDICATOR 7.0 (GLOVE) ×2
GLOVE BIOGEL PI INDICATOR 9 (GLOVE) ×2
GLOVE SS PI 9.0 STRL (GLOVE) ×3 IMPLANT
GOWN STRL REUS W/TWL 2XL LVL3 (GOWN DISPOSABLE) ×3 IMPLANT
GOWN STRL REUS W/TWL LRG LVL3 (GOWN DISPOSABLE) ×3 IMPLANT
NEEDLE HYPO 25X5/8 SAFETYGLIDE (NEEDLE) IMPLANT
NS IRRIG 1000ML POUR BTL (IV SOLUTION) ×3 IMPLANT
PACK C SECTION WH (CUSTOM PROCEDURE TRAY) ×3 IMPLANT
PAD ABD 7.5X8 STRL (GAUZE/BANDAGES/DRESSINGS) ×3 IMPLANT
PAD OB MATERNITY 4.3X12.25 (PERSONAL CARE ITEMS) ×3 IMPLANT
PENCIL SMOKE EVAC W/HOLSTER (ELECTROSURGICAL) ×3 IMPLANT
RETRACTOR TRAXI PANNICULUS (MISCELLANEOUS) ×1 IMPLANT
RTRCTR C-SECT PINK 25CM LRG (MISCELLANEOUS) IMPLANT
RTRCTR C-SECT PINK 34CM XLRG (MISCELLANEOUS) IMPLANT
STRIP CLOSURE SKIN 1/2X4 (GAUZE/BANDAGES/DRESSINGS) IMPLANT
SUT MNCRL 0 VIOLET CTX 36 (SUTURE) ×2 IMPLANT
SUT MONOCRYL 0 CTX 36 (SUTURE) ×4
SUT PLAIN 2 0 (SUTURE) ×2
SUT PLAIN ABS 2-0 CT1 27XMFL (SUTURE) ×1 IMPLANT
SUT VIC AB 0 CT1 27 (SUTURE) ×2
SUT VIC AB 0 CT1 27XBRD ANBCTR (SUTURE) ×1 IMPLANT
SUT VIC AB 2-0 CT1 27 (SUTURE) ×2
SUT VIC AB 2-0 CT1 TAPERPNT 27 (SUTURE) ×1 IMPLANT
SUT VIC AB 4-0 KS 27 (SUTURE) ×3 IMPLANT
SYR BULB IRRIGATION 50ML (SYRINGE) IMPLANT
TOWEL OR 17X24 6PK STRL BLUE (TOWEL DISPOSABLE) ×3 IMPLANT
TRAXI PANNICULUS RETRACTOR (MISCELLANEOUS) ×2
TRAY FOLEY W/BAG SLVR 14FR LF (SET/KITS/TRAYS/PACK) ×3 IMPLANT
WATER STERILE IRR 1000ML POUR (IV SOLUTION) ×3 IMPLANT

## 2019-05-14 NOTE — H&P (Signed)
Obstetric Preoperative History and Physical  Hailey Thompson is a 24 y.o. [phone] with IUP at [redacted]w[redacted]d presenting for scheduled cesarean section.  Reports good fetal movement, no bleeding, no contractions, no leaking of fluid.  No acute preoperative concerns.    Cesarean Section Indication: 1 prior c-section, declines TOLAC  Prenatal Course Source of Care: FT with onset of care at 7 weeks Pregnancy complications or risks: Patient Active Problem List   Diagnosis Date Noted  . No leakage of amniotic fluid into vagina 05/07/2019  . COVID-19 04/18/2019  . Anemia complicating pregnancy [id number]  . Supervision of normal pregnancy 11/05/2018  . History of cesarean section 09/26/2018  . Other fatigue 01/24/2018  . Vitiligo 10/25/2011  . Short stature 07/19/2011  . Hypothyroidism 07/19/2011  . Obesity 07/19/2011  . Hypercholesterolemia with hypertriglyceridemia 07/19/2011   She plans to breastfeed She desires condoms for postpartum contraception.   Prenatal labs and studies: ABO, Rh: --/--/A POS, A POS Performed at [facility], [place] [address], [place], [place] [address]  [phone]) Antibody: NEG (02/14 0853) Rubella: 2.23 (08/10 1639) RPR: NON REACTIVE (02/14 0850)  HBsAg: Negative (08/10 1639)  HIV: Non Reactive (11/23 0917)  [id number] (02/01 1326) 2 hr Glucola  WNL Genetic screening normal Anatomy US normal  Prenatal Transfer Tool  Maternal Diabetes: No Genetic Screening: Normal Maternal Ultrasounds/Referrals: Normal Fetal Ultrasounds or other Referrals:  None Maternal Substance Abuse:  No Significant Maternal Medications:  None Significant Maternal Lab Results: Group B Strep positive  Past Medical History:  Diagnosis Date  . Hypothyroidism   . Migraine   . Thyroid disease   . UTI (lower urinary tract infection)     Past Surgical History:  Procedure Laterality Date  . APPENDECTOMY    . CESAREAN SECTION    . CHOLECYSTECTOMY N/A 11/06/2015   Procedure:  LAPAROSCOPIC CHOLECYSTECTOMY;  Surgeon: Vickie Epley, MD;  Location: [facility];  Service: General;  Laterality: N/A;    OB History  Gravida Para Term Preterm AB Living  4 1 1   2 1   SAB TAB Ectopic Multiple Live Births  2       1    # Outcome Date GA Lbr Len/2nd Weight Sex Delivery Anes PTL Lv  4 Current           3 Term 05/05/14 [redacted]w[redacted]d  3138 g M CS-LTranv EPI  LIV     Complications: Breech presentation  2 SAB           1 SAB             Obstetric Comments  Pt has had 2-3 miscarriages in the past.     Social History   Socioeconomic History  . Marital status: Married    Spouse name: dustin  . Number of children: 1  . Years of education: 50  . Highest education level: 10th grade  Occupational History  . Occupation: homemaker  Tobacco Use  . Smoking status: Never Smoker  . Smokeless tobacco: Never Used  Substance and Sexual Activity  . Alcohol use: No  . Drug use: No  . Sexual activity: Yes    Birth control/protection: None  Other Topics Concern  . Not on file  Social History Narrative  . Not on file   Social Determinants of Health   Financial Resource Strain: Low Risk   . Difficulty of Paying Living Expenses: Not very hard  Food Insecurity: Unknown  . Worried About Charity fundraiser in the Last  Year: Never true  . Ran Out of Food in the Last Year: Not on file  Transportation Needs: No Transportation Needs  . Lack of Transportation (Medical): No  . Lack of Transportation (Non-Medical): No  Physical Activity: Unknown  . Days of Exercise per Week: 3 days  . Minutes of Exercise per Session: Not on file  Stress: No Stress Concern Present  . Feeling of Stress : Only a little  Social Connections: Slightly Isolated  . Frequency of Communication with Friends and Family: More than three times a week  . Frequency of Social Gatherings with Friends and Family: Twice a week  . Attends Religious Services: More than 4 times per year  . Active Member of Clubs or  Organizations: No  . Attends Archivist Meetings: Never  . Marital Status: Married    Family History  Problem Relation Age of Onset  . Short stature Sister        s/p kidney failure  . Kidney disease Sister   . Deafness Sister   . Diabetes Mother        type 1 dx as young child. now with multiple complications  . Thyroid disease Mother   . Deep vein thrombosis Mother   . Cancer Maternal Grandmother        breast  . Cancer Maternal Grandfather   . Short stature Sister        paternal half sister 34'8"    Medications Prior to Admission  Medication Sig Dispense Refill Last Dose  . acetaminophen (TYLENOL) 500 MG tablet Take 1,000 mg by mouth every 6 (six) hours as needed for headache.   Past Week at Unknown time  . Prenatal Vit-Fe Fumarate-FA (PRENATAL MULTIVITAMIN) TABS tablet Take 1 tablet by mouth daily at 12 noon.   05/13/2019 at Unknown time  . Blood Pressure Monitor MISC For regular home bp monitoring during pregnancy 1 each 0   . Cholecalciferol (VITAMIN D3) 125 MCG (5000 UT) CAPS Take 1 capsule (5,000 Units total) by mouth daily. 90 capsule 0   . cyclobenzaprine (FLEXERIL) 5 MG tablet Take 1 tablet (5 mg total) by mouth 3 (three) times daily as needed for muscle spasms. 20 tablet 0   . ferrous sulfate (FERROUSUL) 325 (65 FE) MG tablet Take 1 tablet (325 mg total) by mouth daily with breakfast. 90 tablet 3   . levothyroxine (SYNTHROID) 150 MCG tablet Take 1 tablet (150 mcg total) by mouth daily before breakfast. 30 tablet 3     Allergies  Allergen Reactions  . Mushroom Extract Complex Anaphylaxis  . Chocolate Itching  . Oxycodone Diarrhea  . Prednisone Other (See Comments)    hallucinations  . Versed [Midazolam] Hives    Hives after IV administration of zofran and versed. Unsure which medication caused reaction  . Zofran [Ondansetron Hcl] Hives    Hives after IV adminstration of zofran and versed. Unsure of which medication caused reaction.     Review of  Systems: Pertinent items noted in HPI and remainder of comprehensive ROS otherwise negative.  Physical Exam: BP 102/64   Pulse 100   Temp 98.5 F (36.9 C) (Oral)   Resp 16   Ht 4\' 6"  (1.372 m)   Wt 88 kg   LMP 08/14/2018   SpO2 98%   BMI 46.78 kg/m  FHR by Doppler: 134 bpm CONSTITUTIONAL: Well-developed, well-nourished female in no acute distress.  HENT:  Normocephalic, atraumatic, External right and left ear normal.  EYES: Conjunctivae and EOM are normal.  No scleral icterus.  NECK: Normal range of motion, supple, no masses SKIN: Skin is warm and dry. No rash noted. Not diaphoretic. No erythema. No pallor. [place]: Alert and oriented to person, place, and time. Normal reflexes, muscle tone coordination. No cranial nerve deficit noted. PSYCHIATRIC: Normal mood and affect. Normal behavior. Normal judgment and thought content. CARDIOVASCULAR: Normal heart rate noted RESPIRATORY: Effort and breath sounds normal, no problems with respiration noted ABDOMEN: Soft, nontender, nondistended, gravid. Well-healed Pfannenstiel incision. PELVIC: Deferred MUSCULOSKELETAL: Normal range of motion. No edema and no tenderness. 2+ distal pulses.   Pertinent Labs/Studies:   Results for orders placed or performed during the hospital encounter of 05/12/19 (from the past 72 hour(s))  CBC     Status: Abnormal   Collection Time: 05/12/19  8:50 AM  Result Value Ref Range   WBC 9.9 4.0 - 10.5 K/uL   RBC 3.93 3.87 - 5.11 MIL/uL   Hemoglobin 11.9 (L) 12.0 - 15.0 g/dL   HCT 36.0 36.0 - 46.0 %   MCV 91.6 80.0 - 100.0 fL   MCH 30.3 26.0 - 34.0 pg   MCHC 33.1 30.0 - 36.0 g/dL   RDW 13.5 11.5 - 15.5 %   Platelets 171 150 - 400 K/uL   nRBC 0.0 0.0 - 0.2 %    Comment: Performed at [facility], [place] [address], [place], [place] [address]  Comprehensive metabolic panel     Status: Abnormal   Collection Time: 05/12/19  8:50 AM  Result Value Ref Range   Sodium 136 135 - 145 mmol/L   Potassium 3.9 3.5  - 5.1 mmol/L   Chloride 107 98 - 111 mmol/L   CO2 20 (L) 22 - 32 mmol/L   Glucose, Bld 86 70 - 99 mg/dL   BUN 10 6 - 20 mg/dL   Creatinine, Ser 0.94 0.44 - 1.00 mg/dL   Calcium 8.4 (L) 8.9 - 10.3 mg/dL   Total Protein 6.5 6.5 - 8.1 g/dL   Albumin 1.9 (L) 3.5 - 5.0 g/dL   AST 26 15 - 41 U/L   ALT 22 0 - 44 U/L   Alkaline Phosphatase 138 (H) 38 - 126 U/L   Total Bilirubin 0.5 0.3 - 1.2 mg/dL   GFR calc non Af Amer >60 >60 mL/min   GFR calc Af Amer >60 >60 mL/min   Anion gap 9 5 - 15    Comment: Performed at [facility] [address], [place], [place] [address]  RPR     Status: None   Collection Time: 05/12/19  8:50 AM  Result Value Ref Range   RPR Ser Ql NON REACTIVE NON REACTIVE    Comment: Performed at [facility], [place] [address] [place], [place] [address]  Type and screen [place]     Status: None   Collection Time: 05/12/19  8:53 AM  Result Value Ref Range   ABO/RH(D) A POS    Antibody Screen NEG    Sample Expiration      05/15/2019,2359 Performed at [facility], [place] [address] [place], [place] [address]   ABO/Rh     Status: None   Collection Time: 05/12/19  8:53 AM  Result Value Ref Range   ABO/RH(D)      A POS Performed at [place] [address], [place], [place] [address]     Assessment and Plan: Hailey Thompson is a 24 y.o. [phone] at [redacted]w[redacted]d being admitted  for scheduled cesarean section. The risks of cesarean section discussed with the patient included but were not limited to: bleeding which may require transfusion or reoperation; infection which may require antibiotics; injury to bowel, bladder, ureters or other surrounding organs; injury to the fetus; need for additional procedures including hysterectomy in the event of a life-threatening hemorrhage; placental abnormalities with subsequent pregnancies, incisional problems, thromboembolic phenomenon and other postoperative/anesthesia complications. The patient  concurred with the proposed plan, giving informed written consent for the procedure. Patient has been NPO since last night she will remain NPO for procedure. Anesthesia and OR aware. Preoperative prophylactic antibiotics and SCDs ordered on call to the OR. To OR when ready.   Pregnancy Complications: Hypothyroidism, cont Synthroid post-op.  Contraception: Condoms MOF: Breastfeeding  Barrington Ellison, MD [facility] Fellow, [facility] for [employer], [place]

## 2019-05-14 NOTE — Op Note (Signed)
05/14/2019  10:14 AM  PATIENT:  Hailey Thompson  24 y.o. female  PRE-OPERATIVE DIAGNOSIS:  Repeat C section pregnancy 39 weeks not delivered  POST-OPERATIVE DIAGNOSIS:  Repeat C section pregnancy 39 weeks delivered  PROCEDURE:  Procedure(s): CESAREAN SECTION (N/A) repeat low transverse cervical cesarean section with 2 layer closure  SURGEON:  Surgeon(s) and Role:    * Glo Herring, Angelyn Punt, MD - Primary    * Fair, Marin Shutter, MD - Fellow  PHYSICIAN ASSISTANT:   ASSISTANTS: none   ANESTHESIA:   spinal  EBL:  500 cc or less   BLOOD ADMINISTERED:none  DRAINS: Urinary Catheter (Foley)   LOCAL MEDICATIONS USED:  NONE  SPECIMEN:  Source of Specimen:  Placenta to labor and delivery  DISPOSITION OF SPECIMEN:  Labor and delivery  COUNTS:  YES  TOURNIQUET:  * No tourniquets in log *  DICTATION: .Dragon Dictation  PLAN OF CARE: Admit to inpatient   PATIENT DISPOSITION:  PACU - hemodynamically stable.   Delay start of Pharmacological VTE agent (>24hrs) due to surgical blood loss or risk of bleeding: not applicable Indications: 69 feet 6 female with prior cesarean section for breech, not interested in [facility]  Details of procedure.  Patient was taken the operating room prepped and draped after spinal anesthesia introduced Foley catheter in place with Ancef administered and timeout confirmed by surgical team.  Previous cicatrix was excised with a 1 cm strip of skin and underlying fatty tissue and fibrosis.  Fascia was opened transversely with Bovie cautery extended with Metzenbaum scissors midline opened with sharp dissection peritoneum opened bluntly.  Alexis wound retractor was positioned, and small bladder flap developed transverse uterine incision was performed and fetal vertex rotated out of the incision easily.  Amniotic fluid was clear without malodor.  See pediatricians notes for information on the baby which was in good condition. Placenta was delivered in response to IV oxytocin and  daily uterine massage.  Membrane remnants were extracted from the lower uterine segment and dry gauze used to swab out the uterine cavity.  Running locking 0 Monocryl first layer closure was followed by continuous running 0 Monocryl second layer.  Hemostasis was good uterine tone was good.  Surgical wound retractor was removed, anterior peritoneum closed with 2-0 Vicryl, fascia closed with 0 Vicryl, subcutaneous tissues undermined inferiorly to allow for improved mobilization of the skin to allow reapproximation.  2 layer subcutaneous horizontal mattress sutures were used to close the fatty tent tissue potential space and then 4-0 Vicryl subcuticular closure of the skin completed procedure Steri-Strips and benzoin were applied and patient recovery room in stable condition.  Sponge and needle counts were correct again and confirmed with the electronic sponge count

## 2019-05-14 NOTE — Transfer of Care (Signed)
Immediate Anesthesia Transfer of Care Note  Patient: Hailey Thompson  Procedure(s) Performed: CESAREAN SECTION (N/A )  Patient Location: PACU  Anesthesia Type:Spinal  Level of Consciousness: awake, alert  and oriented  Airway & Oxygen Therapy: Patient Spontanous Breathing  Post-op Assessment: Report given to RN and Post -op Vital signs reviewed and stable  Post vital signs: Reviewed and stable  Last Vitals:  Vitals Value Taken Time  BP    Temp    Pulse 63 05/14/19 1010  Resp 22 05/14/19 1010  SpO2 92 % 05/14/19 1010  Vitals shown include unvalidated device data.  Last Pain:  Vitals:   05/14/19 0808  TempSrc: Oral         Complications: No apparent anesthesia complications

## 2019-05-14 NOTE — Op Note (Signed)
Hailey Thompson PROCEDURE DATE: 05/14/2019  PREOPERATIVE DIAGNOSES: Intrauterine pregnancy at [redacted]w[redacted]d weeks gestation; previous c-section, declines TOLAC  POSTOPERATIVE DIAGNOSES: The same  PROCEDURE: Repeat Low Transverse Cesarean Section  SURGEON:  Dr. Mallory Shirk - Primary Dr. Barrington Ellison - Fellow  ANESTHESIOLOGY TEAM: Anesthesiologist: Catalina Gravel, MD CRNA: Bufford Spikes, CRNA  INDICATIONS: Hailey Thompson is a 24 y.o. [phone] at [redacted]w[redacted]d here for cesarean section secondary to the indications listed under preoperative diagnoses; please see preoperative note for further details.  The risks of cesarean section were discussed with the patient including but were not limited to: bleeding which may require transfusion or reoperation; infection which may require antibiotics; injury to bowel, bladder, ureters or other surrounding organs; injury to the fetus; need for additional procedures including hysterectomy in the event of a life-threatening hemorrhage; placental abnormalities wth subsequent pregnancies, incisional problems, thromboembolic phenomenon and other postoperative/anesthesia complications.   The patient concurred with the proposed plan, giving informed written consent for the procedure.    FINDINGS:  Viable female infant in cephalic presentation. Clear amniotic fluid.  Intact placenta, three vessel cord.  Normal uterus, fallopian tubes and ovaries bilaterally. APGAR (1 MIN): 7   APGAR (5 MINS): 7   APGAR (10 MINS): 9    ANESTHESIA: Spinal INTRAVENOUS FLUIDS: 1000 ml   ESTIMATED BLOOD LOSS: 245 ml URINE OUTPUT:  50 ml SPECIMENS: Placenta sent to L&D COMPLICATIONS: None immediate  PROCEDURE IN DETAIL:  The patient preoperatively received intravenous antibiotics and had sequential compression devices applied to her lower extremities.  She was then taken to the operating room where spinal anesthesia was administered and was found to be adequate. She was then placed in a dorsal  supine position with a leftward tilt, and prepped and draped in a sterile manner.  A foley catheter was placed into her bladder and attached to constant gravity.  After an adequate timeout was performed, a Pfannenstiel skin incision was made with scalpel surrounding her preexisting scar and carried through to the underlying layer of fascia. The fascia was incised in the midline, and this incision was extended bilaterally using the Mayo scissors.  Kocher clamps were applied to the superior aspect of the fascial incision and the underlying rectus muscles were dissected off bluntly and sharply.  A similar process was carried out on the inferior aspect of the fascial incision. The rectus muscles were separated in the midline and the peritoneum was entered bluntly. The Alexis self-retaining retractor was introduced into the abdominal cavity.  Attention was turned to the lower uterine segment where a low transverse hysterotomy was made with a scalpel and extended bilaterally bluntly.  The infant was successfully delivered, the cord was clamped and cut after one minute, and the infant was handed over to the awaiting neonatology team. Uterine massage was then administered, and the placenta delivered intact with a three-vessel cord. The uterus was then cleared of clots and debris.  The hysterotomy was closed with 1-0 Monocryl in a running locked fashion, and an imbricating layer was also placed with 1-0 Monocryl. The pelvis was cleared of all clot and debris. Hemostasis was confirmed on all surfaces.  The retractor was removed.  The peritoneum was closed with a 2-0 Vicryl running stitch. The fascia was then closed using 0 Vicryl in a running fashion.  The subcutaneous layer was irrigated, reapproximated with 2-0 plain gut interrupted stitches, and the skin was closed with a 4-0 Vicryl subcuticular stitch. The patient tolerated the procedure well. Sponge, instrument and needle  counts were correct x 3.  She was taken to the  recovery room in stable condition.   Barrington Ellison, MD [facility] Family Medicine Fellow, [facility] for [employer], [place]

## 2019-05-14 NOTE — Anesthesia Postprocedure Evaluation (Signed)
Anesthesia Post Note  Patient: Hailey Thompson  Procedure(s) Performed: CESAREAN SECTION (N/A )     Patient location during evaluation: PACU Anesthesia Type: Spinal Level of consciousness: oriented, awake and alert and awake Pain management: pain level controlled Vital Signs Assessment: post-procedure vital signs reviewed and stable Respiratory status: spontaneous breathing, respiratory function stable, patient connected to nasal cannula oxygen and nonlabored ventilation Cardiovascular status: blood pressure returned to baseline and stable Postop Assessment: no headache, no backache, no apparent nausea or vomiting, patient able to bend at knees and spinal receding Anesthetic complications: no    Last Vitals:  Vitals:   05/14/19 1134 05/14/19 1235  BP: (!) 106/57 (!) 106/53  Pulse: 60 (!) 53  Resp: 16 20  Temp: 36.8 C (!) 36.4 C  SpO2: 96% 97%    Last Pain:  Vitals:   05/14/19 1235  TempSrc: Axillary  PainSc: [employer]

## 2019-05-14 NOTE — Progress Notes (Signed)
Alerted that patient has only made 40 cc urine in the last two hours despite 500 cc bolus.  S: Patient endorses that she has only had a little bit of PO intake since surgery this morning, has only had one cup of ice water. FOB at bedside and reports she was so excited the day before her CS that did not drink much, maybe two cups of water and a sprite.  O: Vitals:   05/14/19 1634 05/14/19 1745  BP:    Pulse:    Resp:  18  Temp:  98.1 F (36.7 C)  SpO2: 97% 97%   General: awake, alert, and oriented Skin: pale, good turgor, cap refill brisk Abdomen: Dressing dry, clean, and intact. No distension, no tenderness to palpation.  A/P: POD#0 s/p rLTCS -will give another 500 cc bolus -encouraged PO intake -re-check at midnight   Isidoro Santillana, Dan Europe, DO OB Fellow, [facility] 05/14/2019 9:02 PM

## 2019-05-14 NOTE — Discharge Summary (Addendum)
Postpartum Discharge Summary      Patient Name: Hailey Thompson DOB: [birth year] MRN: [id number]  Date of admission: 05/14/2019 Delivering Provider: Jonnie Kind   Date of discharge: 05/16/2019  Admitting diagnosis: History of cesarean section [Z98.891] Intrauterine pregnancy: [redacted]w[redacted]d    Secondary diagnosis:  Active Problems:   Hypothyroidism   Obesity   History of cesarean section   Anemia complicating pregnancy  Additional problems: None     Discharge diagnosis: Term Pregnancy Delivered and Anemia                                                                                                Post partum procedures:None  Augmentation: NA  Complications: None  Hospital course:  Scheduled C/S   24y.o. yo [id number] admitted to the hospital 05/14/2019 for scheduled cesarean section with the following indication:Elective Repeat.  Membrane Rupture Time/Date: 9:26 AM ,05/14/2019   Patient delivered a Viable infant.05/14/2019  Details of operation can be found in separate operative note.  Pateint had an uncomplicated postpartum course. Hgb on POD#1 was 9.1 (12.1 pre-op). She is ambulating, tolerating a regular diet, passing flatus, and urinating well. Patient is discharged home in stable condition on  05/16/19        Delivery time: 9:26 AM    Magnesium Sulfate received: No BMZ received: No Rhophylac:N/A MMR:N/A Transfusion:No  Physical exam  Vitals:   05/15/19 0900 05/15/19 1443 05/15/19 2200 05/16/19 0623  BP: (!) 99/49 120/65 106/64 110/68  Pulse: (!) 59 74 70 71  Resp: '16 16 16 17  ' Temp: 97.9 F (36.6 C) 97.6 F (36.4 C) 98.3 F (36.8 C) 98.1 F (36.7 C)  TempSrc: Oral Oral Oral Oral  SpO2: 99% 99% 100%   Weight:      Height:       General: alert, cooperative and no distress Lochia: appropriate Uterine Fundus: firm Incision: Healing well with no significant drainage, No significant erythema DVT Evaluation: No evidence of DVT seen on physical  exam. No significant calf/ankle edema. Labs: Lab Results  Component Value Date   WBC 15.7 (H) 05/15/2019   HGB 9.1 (L) 05/15/2019   HCT 28.1 (L) 05/15/2019   MCV 93.4 05/15/2019   PLT 160 05/15/2019   CMP Latest Ref Rng & Units 05/14/2019  Glucose 70 - 99 mg/dL -  BUN 6 - 20 mg/dL -  Creatinine 0.44 - 1.00 mg/dL 0.82  Sodium 135 - 145 mmol/L -  Potassium 3.5 - 5.1 mmol/L -  Chloride 98 - 111 mmol/L -  CO2 22 - 32 mmol/L -  Calcium 8.9 - 10.3 mg/dL -  Total Protein 6.5 - 8.1 g/dL -  Total Bilirubin 0.3 - 1.2 mg/dL -  Alkaline Phos 38 - 126 U/L -  AST 15 - 41 U/L -  ALT 0 - 44 U/L -   Edinburgh Score: Edinburgh Postnatal Depression Scale Screening Tool 05/16/2019  I have been able to laugh and see the funny side of things. 0  I have looked forward with enjoyment to things. 0  I have blamed myself unnecessarily when  things went wrong. 2  I have been anxious or worried for no good reason. 2  I have felt scared or panicky for no good reason. 0  Things have been getting on top of me. 1  I have been so unhappy that I have had difficulty sleeping. 0  I have felt sad or miserable. 0  I have been so unhappy that I have been crying. 1  The thought of harming myself has occurred to me. 0  Edinburgh Postnatal Depression Scale Total 6    Discharge instruction: per After Visit Summary and "Baby and Me Booklet".  After visit meds:  Allergies as of 05/16/2019      Reactions   Mushroom Extract Complex Anaphylaxis   Chocolate Itching   Oxycodone Diarrhea   Prednisone Other (See Comments)   hallucinations   Versed [midazolam] Hives   Hives after IV administration of zofran and versed. Unsure which medication caused reaction   Zofran [ondansetron Hcl] Hives   Hives after IV adminstration of zofran and versed. Unsure of which medication caused reaction.       Medication List    TAKE these medications   acetaminophen 500 MG tablet Commonly known as: TYLENOL Take 1,000 mg by mouth  every 6 (six) hours as needed for headache.   Blood Pressure Monitor Misc For regular home bp monitoring during pregnancy   cyclobenzaprine 5 MG tablet Commonly known as: FLEXERIL Take 1 tablet (5 mg total) by mouth 3 (three) times daily as needed for muscle spasms.   ferrous sulfate 325 (65 FE) MG tablet Commonly known as: FerrouSul Take 1 tablet (325 mg total) by mouth daily with breakfast.   levothyroxine 150 MCG tablet Commonly known as: SYNTHROID Take 1 tablet (150 mcg total) by mouth daily before breakfast.   prenatal multivitamin Tabs tablet Take 1 tablet by mouth daily at 12 noon.   Vitamin D3 125 MCG (5000 UT) Caps Take 1 capsule (5,000 Units total) by mouth daily.       Diet: routine diet  Activity: Advance as tolerated. Pelvic rest for 6 weeks.   Outpatient follow HD:IXBOERQS check in 1 wk; PP visit in 4 wks Follow up Appt: Future Appointments  Date Time Provider [place]  05/21/2019  1:30 PM Jonnie Kind, MD [facility]  06/17/2019 11:30 AM Roma Schanz, CNM [facility]   Follow up Visit: 05/21/2019 at 1:30pm at [facility] 06/17/2019 11:30am at [facility]  Please schedule this patient for Postpartum visit in: 4 weeks with the following provider: Any provider In-Person For C/S patients schedule nurse incision check in weeks 2 weeks: yes Low risk pregnancy complicated by: none Delivery mode:  CS Anticipated Birth Control:  Condoms PP Procedures needed: Incision check  Schedule Integrated BH visit: no  Newborn Data: Live born female  Birth Weight: 58g   APGAR: 7, 7  Newborn Delivery   Birth date/time: 05/14/2019 09:26:00 Delivery type: C-Section, Low Transverse Trial of labor: No C-section categorization: Repeat      Baby Feeding: Breast Disposition:home with mother   05/16/2019 Daisy Floro, DO   CNM attestation I have seen and examined this patient and agree with above documentation in the resident's note.    Hailey Thompson is a 24 y.o. [phone] s/p rLTCS.   Pain is well controlled.  Plan for birth control is condoms.  Method of Feeding: breast  PE:  BP 110/68 (BP Location: Right Arm)   Pulse 71   Temp 98.1 F (36.7 C) (  Oral)   Resp 17   Ht '4\' 6"'  (1.372 m)   Wt 88 kg   LMP 08/14/2018   SpO2 100%   Breastfeeding Unknown   BMI 46.78 kg/m  Fundus firm  No results for input(s): HGB, HCT in the last 72 hours.   Plan: discharge today - postpartum care discussed - f/u clinic in 1wk for incision check; 6 weeks for postpartum visit   Myrtis Ser, CNM 6:56 PM

## 2019-05-14 NOTE — Lactation Note (Signed)
This note was copied from a baby's chart. Lactation Consultation Note  Patient Name: Hailey Thompson [id number] Date: 05/14/2019 Reason for consult: Initial assessment;Term  [phone] Initial visit with P2 mom who delivered @ 39wks, baby is now 35 hours old, has had 2 adequate feedings already.  LC entered room to find baby sleeping soundly in bassinet, mom and dad awake and engaged in interaction.  Mom reports positive breast changes during her pregnancy. Mom states she attempted to breastfeed her first child, but after about a day and a half the baby "wouldn't latch anymore." Mom states things are already easier and going better with this newborn than her first child.  Mom states she already has a "mom-cozy" DEBP at home but her insurance company is also sending her a Medela DEBP.  LC demonstrated hand expression and although mom lying flat in bed, glistening noted after a few compressions. Mom noted to have discoloration on both breasts that she says has been there since birth. Mom with small breasts and small nipples/areola but became erect with minimal stimulation.  Reviewed feeding 8-12 times in 24 hours and with feeding cues; feeding cues reviewed. Encouraged STS as much as possible. Lactation brochure with phone numbers left at bedside; reviewed IP/OP lactation services. Encouraged to call with concerns or assistance needed.  Maternal Data Has patient been taught Hand Expression?: Yes Does the patient have breastfeeding experience prior to this delivery?: Yes   Interventions Interventions: Breast feeding basics reviewed;Skin to skin;Hand express  Consult Status Consult Status: Follow-up Date: 05/15/19 Follow-up type: In-patient    Cranston Neighbor 05/14/2019, 2:27 PM

## 2019-05-14 NOTE — Anesthesia Procedure Notes (Signed)
Spinal  Patient location during procedure: OR Start time: 05/14/2019 8:54 AM End time: 05/14/2019 8:57 AM Staffing Performed: anesthesiologist  Anesthesiologist: Catalina Gravel, MD Preanesthetic Checklist Completed: patient identified, IV checked, risks and benefits discussed, surgical consent, monitors and equipment checked, pre-op evaluation and timeout performed Spinal Block Patient position: sitting Prep: DuraPrep and site prepped and draped Patient monitoring: continuous pulse ox and blood pressure Approach: midline Location: L3-4 Injection technique: single-shot Needle Needle type: Pencan  Needle gauge: 24 G Assessment Sensory level: T6 Additional Notes Functioning IV was confirmed and monitors were applied. Sterile prep and drape, including hand hygiene, mask and sterile gloves were used. The patient was positioned and the spine was prepped. The skin was anesthetized with lidocaine.  Free flow of clear CSF was obtained prior to injecting local anesthetic into the CSF.  The spinal needle aspirated freely following injection.  The needle was carefully withdrawn.  The patient tolerated the procedure well. Consent was obtained prior to procedure with all questions answered and concerns addressed. Risks including but not limited to bleeding, infection, nerve damage, paralysis, failed block, inadequate analgesia, allergic reaction, high spinal, itching and headache were discussed and the patient wished to proceed.   Hoy Morn, MD

## 2019-05-14 NOTE — Brief Op Note (Signed)
05/14/2019  10:14 AM  PATIENT:  Hailey Thompson  24 y.o. female  PRE-OPERATIVE DIAGNOSIS:  Repeat C section pregnancy 39 weeks not delivered  POST-OPERATIVE DIAGNOSIS:  Repeat C section pregnancy 39 weeks delivered  PROCEDURE:  Procedure(s): CESAREAN SECTION (N/A) repeat low transverse cervical cesarean section with 2 layer closure  SURGEON:  Surgeon(s) and Role:    * Glo Herring, Angelyn Punt, MD - Primary    * Fair, Marin Shutter, MD - Fellow  PHYSICIAN ASSISTANT:   ASSISTANTS: none   ANESTHESIA:   spinal  EBL:  500 cc or less   BLOOD ADMINISTERED:none  DRAINS: Urinary Catheter (Foley)   LOCAL MEDICATIONS USED:  NONE  SPECIMEN:  Source of Specimen:  Placenta to labor and delivery  DISPOSITION OF SPECIMEN:  Labor and delivery  COUNTS:  YES  TOURNIQUET:  * No tourniquets in log *  DICTATION: .Dragon Dictation  PLAN OF CARE: Admit to inpatient   PATIENT DISPOSITION:  PACU - hemodynamically stable.   Delay start of Pharmacological VTE agent (>24hrs) due to surgical blood loss or risk of bleeding: not applicable Indications: 50 feet 6 female with prior cesarean section for breech, not interested in [facility]  Details of procedure.  Patient was taken the operating room prepped and draped after spinal anesthesia introduced Foley catheter in place with Ancef administered and timeout confirmed by surgical team.  Previous cicatrix was excised with a 1 cm strip of skin and underlying fatty tissue and fibrosis.  Fascia was opened transversely with Bovie cautery extended with Metzenbaum scissors midline opened with sharp dissection peritoneum opened bluntly.  Alexis wound retractor was positioned, and small bladder flap developed transverse uterine incision was performed and fetal vertex rotated out of the incision easily.  Amniotic fluid was clear without malodor.  See pediatricians notes for information on the baby which was in good condition. Placenta was delivered in response to IV oxytocin and  daily uterine massage.  Membrane remnants were extracted from the lower uterine segment and dry gauze used to swab out the uterine cavity.  Running locking 0 Monocryl first layer closure was followed by continuous running 0 Monocryl second layer.  Hemostasis was good uterine tone was good.  Surgical wound retractor was removed, anterior peritoneum closed with 2-0 Vicryl, fascia closed with 0 Vicryl, subcutaneous tissues undermined inferiorly to allow for improved mobilization of the skin to allow reapproximation.  2 layer subcutaneous horizontal mattress sutures were used to close the fatty tent tissue potential space and then 4-0 Vicryl subcuticular closure of the skin completed procedure Steri-Strips and benzoin were applied and patient recovery room in stable condition.  Sponge and needle counts were correct again and confirmed with the electronic sponge count

## 2019-05-15 ENCOUNTER — Telehealth: Payer: Self-pay | Admitting: Obstetrics and Gynecology

## 2019-05-15 LAB — CBC
HCT: 28.1 % — ABNORMAL LOW (ref 36.0–46.0)
Hemoglobin: 9.1 g/dL — ABNORMAL LOW (ref 12.0–15.0)
MCH: 30.2 pg (ref 26.0–34.0)
MCHC: 32.4 g/dL (ref 30.0–36.0)
MCV: 93.4 fL (ref 80.0–100.0)
Platelets: 160 10*3/uL (ref 150–400)
RBC: 3.01 MIL/uL — ABNORMAL LOW (ref 3.87–5.11)
RDW: 13.8 % (ref 11.5–15.5)
WBC: 15.7 10*3/uL — ABNORMAL HIGH (ref 4.0–10.5)
nRBC: 0 % (ref 0.0–0.2)

## 2019-05-15 MED ORDER — FERROUS SULFATE 325 (65 FE) MG PO TABS
325.0000 mg | ORAL_TABLET | ORAL | Status: DC
  Administered 2019-05-15 – 2019-05-16 (×2): 325 mg via ORAL
  Filled 2019-05-15 (×2): qty 1

## 2019-05-15 NOTE — Telephone Encounter (Signed)
Lm that we were checking on her by phone to see how she's doing. Left office ##

## 2019-05-15 NOTE — Progress Notes (Addendum)
Post Partum Day 1, had repeat C-Section 05/14/2019  Subjective: no complaints, up ad lib, voiding, tolerating PO, + flatus and patient has not yet had BM, is urinating well.  Objective: Blood pressure (!) 95/46, pulse 60, temperature (!) 97.5 F (36.4 C), temperature source Oral, resp. rate 16, height 4\' 6"  (1.372 m), weight 88 kg, last menstrual period 08/14/2018, SpO2 97 %, unknown if currently breastfeeding.  Physical Exam:  General: alert, cooperative, appears stated age and no distress Lochia: appropriate Uterine Fundus: firm Incision: healing well, no significant drainage DVT Evaluation: No evidence of DVT seen on physical exam. No significant calf/ankle edema.  Recent Labs    05/14/19 1222 05/15/19 0508  HGB 12.1 9.1*  HCT 36.7 28.1*    Intake/Output Summary (Last 24 hours) at 05/15/2019 [id number] Last data filed at 05/15/2019 0530 Gross per 24 hour  Intake 2562.94 ml  Output 970 ml  Net 1592.94 ml   Assessment/Plan: Breastfeeding and Contraception Condoms   LOS: 1 day   Daisy Floro 05/15/2019, 8:13 AM   GME ATTESTATION:  I saw and evaluated the patient. I agree with the findings and the plan of care as documented in the resident's note.  Most likely will DC tomorrow pending clinical improvement.  Merilyn Baba, DO OB Fellow, [place] [facility] [place] 05/15/2019 7:51 PM

## 2019-05-15 NOTE — Lactation Note (Signed)
This note was copied from a baby's chart. Lactation Consultation Note Baby 40 hrs old. RN stated mom has cracked bleeding nipples that hurt. Mom has "V" shaped breast w/everted nipples. Lt. Nipple w/pink areola red positional stripe. Cracked, mom stated it has bled. Rt. Nipple brown areola w/positional stripe blood blister. Mom stated she chooses to pump and bottle feed. Mom shown how to use DEBP & how to disassemble, clean, & reassemble parts. Mom knows to pump q3h for 15-20 min. Started mom pumping w/dot of colostrum to flange #24. Discussed w/mom that baby will need to be fed something until her milk comes in 3-5 days. Gave mom choices of Donor milk or formula, mom chose Donor milk. Discussed milk storage and reviewed how much to give baby at hours of age. Baby has been sleepy, hadn't fed since 0100.  Gave 10 ml Donor milk. Mom feeding baby cradled in her arm, semi upright, baby choked. Sat baby straight upright. Baby used yellow nipple. LC recommends purple nipple. [place] couldn't find any on the floor, RN stated she will call another floor. Mom and dad state they understand feeding and giving Donor milk. Discussed supplements to increase milk supply. Encouraged to call for assistance or questions. Asked RN to get order for baby to have her own stock of Donor milk in her name since she is getting every 3 hrs.  Patient Name: Hailey Thompson [id number] Date: 05/15/2019 Reason for consult: Follow-up assessment;Nipple pain/trauma;Difficult latch;Term   Maternal Data    Feeding Feeding Type: Donor Breast Milk Nipple Type: Slow - flow  LATCH Score       Type of Nipple: Everted at rest and after stimulation  Comfort (Breast/Nipple): Engorged, cracked, bleeding, large blisters, severe discomfort        Interventions Interventions: Position options;Skin to skin;Breast massage;Coconut oil;Hand express;Comfort gels;DEBP;Breast compression;Support pillows  Lactation Tools  Discussed/Used Tools: Pump;Comfort gels;Coconut oil Breast pump type: Double-Electric Breast Pump Pump Review: Setup, frequency, and cleaning;Milk Storage Initiated by:: Allayne Stack RN IBCLC Date initiated:: 05/15/19   Consult Status Consult Status: Follow-up Date: 05/16/19 Follow-up type: In-patient    Theodoro Kalata 05/15/2019, 5:27 AM

## 2019-05-15 NOTE — Progress Notes (Signed)
Patient ambulated to bathroom to change pads with no issues, no complaints of dizziness or lightheadedness. Ambulated back to bed.

## 2019-05-16 MED ORDER — SENNOSIDES-DOCUSATE SODIUM 8.6-50 MG PO TABS: 2.0000 | ORAL_TABLET | ORAL | 0 refills | Status: DC

## 2019-05-16 MED ORDER — IBUPROFEN 800 MG PO TABS: 800.0000 mg | ORAL_TABLET | Freq: Four times a day (QID) | ORAL | 0 refills | Status: DC

## 2019-05-16 MED ORDER — ACETAMINOPHEN 325 MG PO TABS: 650.0000 mg | ORAL_TABLET | Freq: Four times a day (QID) | ORAL | 0 refills | Status: DC | PRN

## 2019-05-16 MED ORDER — OXYCODONE HCL 5 MG PO TABS: 5.0000 mg | ORAL_TABLET | Freq: Three times a day (TID) | ORAL | 0 refills | Status: DC | PRN

## 2019-05-16 NOTE — Lactation Note (Signed)
This note was copied from a baby's chart. Lactation Consultation Note  Patient Name: Girl Janeesa Bankhead [id number] Date: 05/16/2019   Mom has a DEBP for home use: a "Cozy pump." Mom says that infant is "latching way better" and her nipples are healing. Mom is unsure of frequency of swallows at breast. Her description sounds as if they may not be frequent. Pediatrician & I discussed supplementing after feedings at the breast for the time being (infant is also at 7% weight loss this morning).    On breast exam, Mom is noted to have a compression stripe on her L nipple. Her L areola is pink while there is about a 1 cm white circle around the circumference of the areola.  On Mom's R nipple, she has a tiny scab, with normal colored areola. However, there are 2 large white spots on her areola. Mom reports that her breasts (both sides) have always had this discoloration.   B/c the compression stripe is most likely from the way the infant latches, I showed Mom the specifics of an asymmetric latch via the [employer]. Mom found that to be very helpful.   Parents have been happy with the Enfamil Extra-Slow flow nipple while here. Parents have [employer] & Dr. Saul Fordyce products at home. I recommended they use the newborn-level nipple with either of those product lines to have something similar to what they used in the hospital.   I noted that Flexeril was on Mom's discharge med list. I discussed how it is assumed that Flexeril can get into breast milk, but Mom doesn't plan on taking any.  Matthias Hughs [facility] 05/16/2019, 11:27 AM

## 2019-05-16 NOTE — Discharge Instructions (Signed)
Cesarean Delivery, Care After This sheet gives you information about how to care for yourself after your procedure. Your health care provider may also give you more specific instructions. If you have problems or questions, contact your health care provider. What can I expect after the procedure? After the procedure, it is common to have:  A small amount of blood or clear fluid coming from the incision.  Some redness, swelling, and pain in your incision area.  Some abdominal pain and soreness.  Vaginal bleeding (lochia). Even though you did not have a vaginal delivery, you will still have vaginal bleeding and discharge.  Pelvic cramps.  Fatigue. You may have pain, swelling, and discomfort in the tissue between your vagina and your anus (perineum) if:  Your C-section was unplanned, and you were allowed to labor and push.  An incision was made in the area (episiotomy) or the tissue tore during attempted vaginal delivery. Follow these instructions at home: Incision care   Follow instructions from your health care provider about how to take care of your incision. Make sure you: ? Wash your hands with soap and water before you change your bandage (dressing). If soap and water are not available, use hand sanitizer. ? If you have a dressing, change it or remove it as told by your health care provider. ? Leave stitches (sutures), skin staples, skin glue, or adhesive strips in place. These skin closures may need to stay in place for 2 weeks or longer. If adhesive strip edges start to loosen and curl up, you may trim the loose edges. Do not remove adhesive strips completely unless your health care provider tells you to do that.  Check your incision area every day for signs of infection. Check for: ? More redness, swelling, or pain. ? More fluid or blood. ? Warmth. ? Pus or a bad smell.  Do not take baths, swim, or use a hot tub until your health care provider says it's okay. Ask your health  care provider if you can take showers.  When you cough or sneeze, hug a pillow. This helps with pain and decreases the chance of your incision opening up (dehiscing). Do this until your incision heals. Medicines  Take over-the-counter and prescription medicines only as told by your health care provider.  If you were prescribed an antibiotic medicine, take it as told by your health care provider. Do not stop taking the antibiotic even if you start to feel better.  Do not drive or use heavy machinery while taking prescription pain medicine. Lifestyle  Do not drink alcohol. This is especially important if you are breastfeeding or taking pain medicine.  Do not use any products that contain nicotine or tobacco, such as cigarettes, e-cigarettes, and chewing tobacco. If you need help quitting, ask your health care provider. Eating and drinking  Drink at least 8 eight-ounce glasses of water every day unless told not to by your health care provider. If you breastfeed, you may need to drink even more water.  Eat high-fiber foods every day. These foods may help prevent or relieve constipation. High-fiber foods include: ? Whole grain cereals and breads. ? Brown rice. ? Beans. ? Fresh fruits and vegetables. Activity   If possible, have someone help you care for your baby and help with household activities for at least a few days after you leave the hospital.  Return to your normal activities as told by your health care provider. Ask your health care provider what activities are safe for   you.  Rest as much as possible. Try to rest or take a nap while your baby is sleeping.  Do not lift anything that is heavier than 10 lbs (4.5 kg), or the limit that you were told, until your health care provider says that it is safe.  Talk with your health care provider about when you can engage in sexual activity. This may depend on your: ? Risk of infection. ? How fast you heal. ? Comfort and desire to  engage in sexual activity. General instructions  Do not use tampons or douches until your health care provider approves.  Wear loose, comfortable clothing and a supportive and well-fitting bra.  Keep your perineum clean and dry. Wipe from front to back when you use the toilet.  If you pass a blood clot, save it and call your health care provider to discuss. Do not flush blood clots down the toilet before you get instructions from your health care provider.  Keep all follow-up visits for you and your baby as told by your health care provider. This is important. Contact a health care provider if:  You have: ? A fever. ? Bad-smelling vaginal discharge. ? Pus or a bad smell coming from your incision. ? Difficulty or pain when urinating. ? A sudden increase or decrease in the frequency of your bowel movements. ? More redness, swelling, or pain around your incision. ? More fluid or blood coming from your incision. ? A rash. ? Nausea. ? Little or no interest in activities you used to enjoy. ? Questions about caring for yourself or your baby.  Your incision feels warm to the touch.  Your breasts turn red or become painful or hard.  You feel unusually sad or worried.  You vomit.  You pass a blood clot from your vagina.  You urinate more than usual.  You are dizzy or light-headed. Get help right away if:  You have: ? Pain that does not go away or get better with medicine. ? Chest pain. ? Difficulty breathing. ? Blurred vision or spots in your vision. ? Thoughts about hurting yourself or your baby. ? New pain in your abdomen or in one of your legs. ? A severe headache.  You faint.  You bleed from your vagina so much that you fill more than one sanitary pad in one hour. Bleeding should not be heavier than your heaviest period. Summary  After the procedure, it is common to have pain at your incision site, abdominal cramping, and slight bleeding from your vagina.  Check  your incision area every day for signs of infection.  Tell your health care provider about any unusual symptoms.  Keep all follow-up visits for you and your baby as told by your health care provider. This information is not intended to replace advice given to you by your health care provider. Make sure you discuss any questions you have with your health care provider. Document Revised: 09/20/2017 Document Reviewed: 09/20/2017 Elsevier Patient Education  2020 [employer]  

## 2019-05-16 NOTE — Lactation Note (Signed)
This note was copied from a baby's chart. Lactation Consultation Note  Patient Name: Hailey Thompson [id number] Date: 05/16/2019  P2, 21 hour female infant. LC entered the room mom and infant asleep.    Maternal Data    Feeding    LATCH Score                   Interventions    Lactation Tools Discussed/Used     Consult Status      Vicente Serene 05/16/2019, 4:53 AM

## 2019-05-16 NOTE — Lactation Note (Signed)
This note was copied from a baby's chart. Lactation Consultation Note  Patient Name: Hailey Thompson [id number] Date: 05/16/2019   Multiple attempts were made to see Mom, but either the RN was with the Mom or Mom was in the bathroom. I informed Dad how to get a hold of me if they'd like for me to return.    Matthias Hughs [facility] 05/16/2019, 10:57 AM

## 2019-05-21 ENCOUNTER — Other Ambulatory Visit: Payer: Self-pay

## 2019-05-21 ENCOUNTER — Ambulatory Visit (INDEPENDENT_AMBULATORY_CARE_PROVIDER_SITE_OTHER): Payer: Medicaid Other | Admitting: Obstetrics and Gynecology

## 2019-05-21 ENCOUNTER — Encounter: Payer: Self-pay | Admitting: Obstetrics and Gynecology

## 2019-05-21 VITALS — BP 122/87 | HR 83 | Ht <= 58 in | Wt 181.0 lb

## 2019-05-21 DIAGNOSIS — Z1389 Encounter for screening for other disorder: Secondary | ICD-10-CM

## 2019-05-21 DIAGNOSIS — Z331 Pregnant state, incidental: Secondary | ICD-10-CM

## 2019-05-21 DIAGNOSIS — O34211 Maternal care for low transverse scar from previous cesarean delivery: Secondary | ICD-10-CM

## 2019-05-21 MED ORDER — TRAMADOL HCL 50 MG PO TABS: 50.0000 mg | ORAL_TABLET | Freq: Four times a day (QID) | ORAL | 0 refills | Status: DC | PRN

## 2019-05-21 NOTE — Progress Notes (Signed)
Patient ID: Hailey Thompson, female   DOB: Apr 16, 1995, 24 y.o.   MRN: [id number]     Subjective:  Hailey Thompson is a 24 y.o. female now 1 weeks status post c section. She reports that she is still tender. She took the honeycomb off last night and there is a lot of bruising around the area. She is bottle feeding her baby.  Review of Systems Negative    Diet: negative   Bowel movements: normal.  Pain is controlled with Ibuprofen. She was prescribed Oxycodone but is allergic so she cannot take it.  Objective:  BP 122/87 (BP Location: Right Arm, Patient Position: Sitting, Cuff Size: Normal)   Pulse 83   Ht 4\' 6"  (1.372 m)   Wt 181 lb (82.1 kg)   LMP 08/14/2018   Breastfeeding No   BMI 43.64 kg/m  General:Well developed, well nourished.  No acute distress. Abdomen: Bowel sounds normal, soft, non-tender. Pelvic Exam: DEFERRED  Incision(s): Bruising around the incision site    Assessment:  Post-Op 1 weeks s/p c section  Doing well postoperatively.   Plan:  1. Wound care discussed. Leave Steri strips in place until they begin to fall off. 2.   Current medications. Will give the pt Rx Tramadol for her pain. 3.   Activity restrictions: no overhead lifting 4.   Return to work: not applicable. 5.   Follow up in 4 weeks.  By signing my name below, I, De Burrs, attest that this documentation has been prepared under the direction and in the presence of Jonnie Kind, MD. Electronically Signed: De Burrs, Medical Scribe. 05/21/19. 1:56 PM.  I personally performed the services described in this documentation, which was SCRIBED in my presence. The recorded information has been reviewed and considered accurate. It has been edited as necessary during review. Jonnie Kind, MD

## 2019-05-23 NOTE — Telephone Encounter (Signed)
Call and add to tomorrows early schedule, 8:30 hopefully

## 2019-05-24 ENCOUNTER — Ambulatory Visit (INDEPENDENT_AMBULATORY_CARE_PROVIDER_SITE_OTHER): Payer: Medicaid Other | Admitting: Obstetrics and Gynecology

## 2019-05-24 ENCOUNTER — Other Ambulatory Visit: Payer: Self-pay

## 2019-05-24 ENCOUNTER — Encounter: Payer: Self-pay | Admitting: Obstetrics and Gynecology

## 2019-05-24 VITALS — BP 110/80 | HR 81 | Ht <= 58 in | Wt 178.2 lb

## 2019-05-24 DIAGNOSIS — O902 Hematoma of obstetric wound: Secondary | ICD-10-CM

## 2019-05-24 MED ORDER — AMOXICILLIN-POT CLAVULANATE 875-125 MG PO TABS: 1.0000 | ORAL_TABLET | Freq: Two times a day (BID) | ORAL | 0 refills | Status: DC

## 2019-05-24 NOTE — Progress Notes (Deleted)
   Subjective:  Hailey Thompson is a 24 y.o. female now 2 weeks status post cesarean section, s.    she is having some bloody drainage from incision, has bruising noted at last visit above and below incision. Review of Systems Negative except ***   Diet:   ***   Bowel movements : {normal/abnormal***:19619}.  {pain control:13522::"The patient is not having any pain."}  Objective:  BP 110/80 (BP Location: Right Arm, Patient Position: Sitting, Cuff Size: Normal)   Pulse 81   Ht 4\' 6"  (1.372 m)   Wt 178 lb 3.2 oz (80.8 kg)   LMP 08/14/2018   Breastfeeding No   BMI 42.97 kg/m  General:Well developed, well nourished.  No acute distress. Abdomen: Bowel sounds normal, soft, non-tender. Pelvic Exam:    External Genitalia:  Normal.    Vagina: Normal    Cervix: Normal***    Uterus: Normal***    Adnexa/Bimanual: Normal***  Incision(s): N/A***  {Healing ***, no drainage, no erythema, no hernia, no swelling, no dehiscence,}     Assessment:  Post-Op {1-10:13787} weeks s/p {gyn surgeries:13997}   *** postoperatively.   Plan:  1.Wound care discussed  *** 2. . current medications.*** 3. Activity restrictions: {restrictions:13723} 4. return to work: {work return:14002}. 5. Follow up in {1-10:13787} {time; units:18646}.

## 2019-05-24 NOTE — Progress Notes (Addendum)
Patient ID: Hailey Thompson, female   DOB: 26-Jul-1995, 24 y.o.   MRN: [id number]  Subjective:  Hailey Thompson is a 24 y.o. female now 1 week and 3 days status post C-section.   She still has some bruising and she notes some blood when she took a shower the previous night. She is very worried about bleeding internally. She is currently bottle feeding.   Review of Systems Negative except 6 cm hematoma   Diet:   normal   Bowel movements : normal.  Pain is controlled with current analgesics. Medications being used: ibuprofen (OTC).  Objective:  BP 110/80 (BP Location: Right Arm, Patient Position: Sitting, Cuff Size: Normal)   Pulse 81   Ht 4\' 6"  (1.372 m)   Wt 178 lb 3.2 oz (80.8 kg)   LMP 08/14/2018   Breastfeeding No   BMI 42.97 kg/m  General:Well developed, well nourished.  No acute distress. Abdomen: Bowel sounds normal, soft, non-tender. Pelvic Exam: Deferred  Incision(s): 6 cm area of ecchymosis, small amount of watery drainage,  erythematous, no hernia, no dehiscence. Non purulent.  Hematoma was lanced and drained to aid in healing process. Moderate amount of fluid drained, 10 cc of lidocaine used, iodoforn wick inserted      Assessment:  Post-Op 1 week and 3 days s/p C-section  I&D Postoperative hematoma;deep in incision,opened 6 cm in size evacuated and cleansed with saline, nonpurulent, with deep defects that extent 10 cm to pt left and 4 cm to pt right. Cleansed and expressed bloody fluid, packed woth iodoform gauze, and then ,ABD pad placed   Plan:  1. Bandages; ABD pad places and idoforn wick used 2. current medications.Ibuprofen. Rx Augmentin  3. Activity restrictions: no bending, stooping, or squatting, no driving and no lifting more than 10, no sexual activity pounds 4. return to work: not applicable. 5. Return to MAU for visit with Dr. Carmell Austria on 05/25/2019.  6. Follow up at [place] [place] on 05/27/2019. Augmentin 875 bid. By signing my name below, I, Samul Dada, attest that this  documentation has been prepared under the direction and in the presence of Jonnie Kind, MD. Electronically Signed: [place]. 05/24/19. 8:54 AM.  I personally performed the services described in this documentation, which was SCRIBED in my presence. The recorded information has been reviewed and considered accurate. It has been edited as necessary during review. Jonnie Kind, MD

## 2019-05-25 ENCOUNTER — Inpatient Hospital Stay (HOSPITAL_COMMUNITY)
Admission: AD | Admit: 2019-05-25 | Discharge: 2019-05-25 | Disposition: A | Discharge status: Home / Self Care | Payer: Medicaid Other | Attending: Obstetrics and Gynecology | Admitting: Obstetrics and Gynecology

## 2019-05-25 ENCOUNTER — Other Ambulatory Visit: Payer: Self-pay

## 2019-05-25 DIAGNOSIS — S301XXS Contusion of abdominal wall, sequela: Secondary | ICD-10-CM

## 2019-05-25 DIAGNOSIS — O902 Hematoma of obstetric wound: Secondary | ICD-10-CM | POA: Diagnosis not present

## 2019-05-25 DIAGNOSIS — Z4801 Encounter for change or removal of surgical wound dressing: Secondary | ICD-10-CM | POA: Diagnosis present

## 2019-05-25 DIAGNOSIS — T8131XS Disruption of external operation (surgical) wound, not elsewhere classified, sequela: Secondary | ICD-10-CM

## 2019-05-25 DIAGNOSIS — O900 Disruption of cesarean delivery wound: Secondary | ICD-10-CM | POA: Diagnosis not present

## 2019-05-25 MED ORDER — LIDOCAINE HCL (PF) 1 % IJ SOLN: 10.0000 mL | Freq: Once | INTRAMUSCULAR | Status: AC

## 2019-05-25 MED ORDER — LIDOCAINE HCL (PF) 1 % IJ SOLN
INTRAMUSCULAR | Status: AC
  Administered 2019-05-25: 10 mL
  Filled 2019-05-25: qty 30

## 2019-05-25 NOTE — MAU Note (Signed)
Hailey Thompson is a 24 y.o.  here in MAU reporting: for dressing change. C-section was on 2/16. Pain score: 2/10. Soreness at incision site. Vitals:   05/25/19 0815  BP: (!) 106/59  Pulse: 79  Resp: 16  Temp: 98 F (36.7 C)  SpO2: 98%      Lab orders placed from triage: none

## 2019-05-25 NOTE — MAU Provider Note (Signed)
Chief Complaint:  dressing change  First Provider Initiated Contact with Patient 05/25/19 0855      HPI: Hailey Thompson is a 24 y.o. [id number] who is s/p Repeat C-section on 2/16. She presented to [facility] for wound check and found to have post-operative hematoma. Dr. Glo Herring opened and packed wound and started patient on Augmentin. She returns today for dressing change. Reports some clear drainage from wound but otherwise denies increased pain, foul smell, fever, chills, nausea, vomiting, diarrhea, constipation or other complaints.   Past Medical History: Past Medical History:  Diagnosis Date  . Hypothyroidism   . Migraine   . Thyroid disease   . 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     Past Surgical History: Past Surgical History:  Procedure Laterality Date  . APPENDECTOMY    . CESAREAN SECTION    . CESAREAN SECTION N/A 05/14/2019   Procedure: CESAREAN SECTION;  Surgeon: Jonnie Kind, MD;  Location: [facility];  Service: Obstetrics;  Laterality: N/A;  . CHOLECYSTECTOMY N/A 11/06/2015   Procedure: LAPAROSCOPIC CHOLECYSTECTOMY;  Surgeon: Vickie Epley, MD;  Location: [facility] ORS;  Service: General;  Laterality: N/A;    Family History: Family History  Problem Relation Age of Onset  . Short stature Sister        s/p kidney failure  . Kidney disease Sister   . Deafness Sister   . Diabetes Mother        type 1 dx as young child. now with multiple complications  . Thyroid disease Mother   . Deep vein thrombosis Mother   . Cancer Maternal  Grandmother        breast  . Cancer Maternal Grandfather   . Short stature Sister        paternal half sister 67'8"    Social History: Social History   Tobacco Use  . Smoking status: Never Smoker  . Smokeless tobacco: Never Used  Substance Use Topics  . Alcohol use: No  . Drug use: No    Allergies:  Allergies  Allergen Reactions  . Mushroom Extract Complex Anaphylaxis  . Chocolate Itching  . Oxycodone Diarrhea  . Prednisone Other (See Comments)    hallucinations  . Versed [Midazolam] Hives    Hives after IV administration of zofran and versed. Unsure which medication caused reaction  . Zofran [Ondansetron Hcl] Hives    Hives after IV adminstration of zofran and versed. Unsure of which medication caused reaction.     Meds:  Medications Prior to Admission  Medication Sig Dispense Refill Last Dose  . acetaminophen (TYLENOL) 325 MG tablet Take 2 tablets (650 mg total) by mouth every 6 (six) hours as needed for mild pain (temperature > 101.5.). 30 tablet 0   . amoxicillin-clavulanate (AUGMENTIN) 875-125 MG tablet Take  1 tablet by mouth 2 (two) times daily. 14 tablet 0   . Blood Pressure Monitor MISC For regular home bp monitoring during pregnancy 1 each 0   . Cholecalciferol (VITAMIN D3) 125 MCG (5000 UT) CAPS Take 1 capsule (5,000 Units total) by mouth daily. 90 capsule 0   . cyclobenzaprine (FLEXERIL) 5 MG tablet Take 1 tablet (5 mg total) by mouth 3 (three) times daily as needed for muscle spasms. 20 tablet 0   . ferrous sulfate (FERROUSUL) 325 (65 FE) MG tablet Take 1 tablet (325 mg total) by mouth daily with breakfast. 90 tablet 3   . ibuprofen (ADVIL) 800 MG tablet Take 1 tablet (800 mg total) by mouth every 6 (six) hours. 30 tablet 0   . levothyroxine (SYNTHROID) 150 MCG tablet Take 1 tablet (150 mcg total) by mouth daily before breakfast. 30 tablet 3   . Prenatal Vit-Fe Fumarate-FA (PRENATAL MULTIVITAMIN) TABS tablet Take 1 tablet by mouth daily at 12 noon.     .  traMADol (ULTRAM) 50 MG tablet Take 1 tablet (50 mg total) by mouth every 6 (six) hours as needed for moderate pain or severe pain. 30 tablet 0     ROS:  Review of Systems All other systems negative unless noted above in HPI.   I have reviewed patient's Past Medical Hx, Surgical Hx, Family Hx, Social Hx, medications and allergies.   Physical Exam   Patient Vitals for the past 24 hrs:  BP Temp Temp src Pulse Resp SpO2  05/25/19 0815 (!) 106/59 98 F (36.7 C) Oral 79 16 98 %   Constitutional: Well-developed, well-nourished female in no acute distress.  Cardiovascular: normal rate Respiratory: normal effort GI: Abd soft, non-tender. Ecchymosis noted superiorly and inferiorly to wound but improving from prior exam. Small open incision about 2 cm wide with minimal drainage. No surrounding erythema, warmth, tenderness.  MS: Extremities nontender, no edema, normal ROM Neurologic: Alert and oriented x 4.    Labs: No results found for this or any previous visit (from the past 24 hour(s)). --/--/A POS, A POS Performed at [facility], [place] [address] [place], [place] [address]  [phone])  Imaging:  No results found.  MAU Course/MDM: Orders Placed This Encounter  Procedures  . Discharge patient    Meds ordered this encounter  Medications  . lidocaine (PF) (XYLOCAINE) 1 % injection    Melanee Spry, Ch: cabinet override  . lidocaine (PF) (XYLOCAINE) 1 % injection 10 mL    Went bedside with Dr. Glo Herring to assess patient. Packing and dressing removed from 2/26. Betadine applied and sterile field set up. 10 cc 1% Lidocaine injected into and surrounding wound site. With hemostats, area debrided with dry dressing with minimal clots or drainage noted. Wet dressing then packed into wound. Patient tolerated well and is to return tomorrow for similar procedure.   Assessment: 1. Wound disruption, post-op, skin, sequela   2. Abdominal wall hematoma, sequela      Plan: Discharge home with return precautuions. Cont Augmentin. Return to MAU tomorrow morning for dressing change.  Allergies as of 05/25/2019      Reactions   Mushroom Extract Complex Anaphylaxis   Chocolate Itching   Oxycodone Diarrhea   Prednisone Other (See Comments)   hallucinations   Versed [midazolam] Hives   Hives after IV administration of zofran and versed. Unsure which medication caused reaction   Zofran [ondansetron Hcl] Hives   Hives after IV adminstration of zofran and versed. Unsure of  which medication caused reaction.       Medication List    TAKE these medications   acetaminophen 325 MG tablet Commonly known as: TYLENOL Take 2 tablets (650 mg total) by mouth every 6 (six) hours as needed for mild pain (temperature > 101.5.).   amoxicillin-clavulanate 875-125 MG tablet Commonly known as: Augmentin Take 1 tablet by mouth 2 (two) times daily.   Blood Pressure Monitor Misc For regular home bp monitoring during pregnancy   cyclobenzaprine 5 MG tablet Commonly known as: FLEXERIL Take 1 tablet (5 mg total) by mouth 3 (three) times daily as needed for muscle spasms.   ferrous sulfate 325 (65 FE) MG tablet Commonly known as: FerrouSul Take 1 tablet (325 mg total) by mouth daily with breakfast.   ibuprofen 800 MG tablet Commonly known as: ADVIL Take 1 tablet (800 mg total) by mouth every 6 (six) hours.   levothyroxine 150 MCG tablet Commonly known as: SYNTHROID Take 1 tablet (150 mcg total) by mouth daily before breakfast.   prenatal multivitamin Tabs tablet Take 1 tablet by mouth daily at 12 noon.   traMADol 50 MG tablet Commonly known as: ULTRAM Take 1 tablet (50 mg total) by mouth every 6 (six) hours as needed for moderate pain or severe pain.   Vitamin D3 125 MCG (5000 UT) Caps Take 1 capsule (5,000 Units total) by mouth daily.       Barrington Ellison, MD [facility] Fellow, [facility] for [facility], [facility] 05/25/2019 9:20 AM

## 2019-05-25 NOTE — Discharge Instructions (Signed)
Please return by 0700 on 2/28 for Dr. Marice Potter to change wound dressing.

## 2019-05-26 ENCOUNTER — Inpatient Hospital Stay (HOSPITAL_COMMUNITY)
Admission: AD | Admit: 2019-05-26 | Discharge: 2019-05-26 | Disposition: A | Discharge status: Home / Self Care | Payer: Medicaid Other | Attending: Obstetrics & Gynecology | Admitting: Obstetrics & Gynecology

## 2019-05-26 ENCOUNTER — Other Ambulatory Visit: Payer: Self-pay

## 2019-05-26 DIAGNOSIS — Z4801 Encounter for change or removal of surgical wound dressing: Secondary | ICD-10-CM | POA: Diagnosis present

## 2019-05-26 DIAGNOSIS — Z98891 History of uterine scar from previous surgery: Secondary | ICD-10-CM

## 2019-05-26 DIAGNOSIS — O902 Hematoma of obstetric wound: Secondary | ICD-10-CM | POA: Insufficient documentation

## 2019-05-26 NOTE — MAU Note (Signed)
Hailey Thompson is a 24 y.o. at [redacted]w[redacted]d here in MAU reporting:  For repeat dressing change.  C-Section on 2/16. Pain score: 4/10. Sore. Worse today due to increased movement. Patient is inquiring about incision being "numbed" prior to dressing change due to soreness. Vitals:   05/26/19 0710  BP: 104/61  Pulse: 80  Resp: 16  Temp: 97.8 F (36.6 C)    Lab orders placed from triage: none

## 2019-05-26 NOTE — MAU Provider Note (Signed)
Chief Complaint:  dressing change   First Provider Initiated Contact with Patient 05/26/19 [phone]      HPI: Hailey Thompson is a 24 y.o. [id number] who is s/p Repeat C-section on 2/16. She presented to [facility] for wound check and found to have post-operative hematoma on 2/26. Dr. Glo Herring opened and packed wound and started patient on Augmentin. She presented to MAU yesterday for dressing change and again today as she was instructed. Reports some pain around incision site yesterday after the dressing change. She has taken Tramadol but sparingly as this makes her sleepy. Denies foul smell, fever, chills, nausea, vomiting, diarrhea, constipation or other complaints.   Past Medical History: Past Medical History:  Diagnosis Date  . Hypothyroidism   . Migraine   . Thyroid disease   . 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     Past Surgical History: Past Surgical History:  Procedure Laterality Date  . APPENDECTOMY    . CESAREAN SECTION    . CESAREAN SECTION N/A 05/14/2019   Procedure: CESAREAN SECTION;  Surgeon: Jonnie Kind, MD;  Location: [facility];  Service: Obstetrics;  Laterality: N/A;  . CHOLECYSTECTOMY N/A 11/06/2015   Procedure: LAPAROSCOPIC CHOLECYSTECTOMY;  Surgeon: Vickie Epley, MD;  Location: [facility];  Service: General;  Laterality: N/A;    Family History: Family History  Problem Relation Age of Onset  . Short stature Sister        s/p kidney failure  . Kidney disease Sister   . Deafness Sister   . Diabetes Mother        type 1 dx as  young child. now with multiple complications  . Thyroid disease Mother   . Deep vein thrombosis Mother   . Cancer Maternal Grandmother        breast  . Cancer Maternal Grandfather   . Short stature Sister        paternal half sister 90'8"    Social History: Social History   Tobacco Use  . Smoking status: Never Smoker  . Smokeless tobacco: Never Used  Substance Use Topics  . Alcohol use: No  . Drug use: No    Allergies:  Allergies  Allergen Reactions  . Mushroom Extract Complex Anaphylaxis  . Chocolate Itching  . Oxycodone Diarrhea  . Prednisone Other (See Comments)    hallucinations  . Versed [Midazolam] Hives    Hives after IV administration of zofran and versed. Unsure which medication caused reaction  . Zofran [Ondansetron Hcl] Hives    Hives after IV adminstration of zofran and versed. Unsure of which medication caused reaction.     Meds:  No medications prior to admission.    ROS:  Review of Systems All other systems negative unless noted above in HPI.   I have  reviewed patient's Past Medical Hx, Surgical Hx, Family Hx, Social Hx, medications and allergies.   Physical Exam   Patient Vitals for the past 24 hrs:  BP Temp Temp src Pulse Resp SpO2  05/26/19 0710 104/61 97.8 F (36.6 C) Oral 80 16 98 %   Constitutional: Well-developed, well-nourished female in no acute distress.  Cardiovascular: normal rate Respiratory: normal effort GI: Abd soft, non-tender. Ecchymosis noted superiorly and inferiorly to wound but improving from prior exam. Small open incision about 2 cm wide with drainage noted on old dressing but minimal with debridement today. No surrounding erythema, warmth, tenderness.  MS: Extremities nontender, no edema, normal ROM Neurologic: Alert and oriented x 4.    Labs: No results found for this or any previous visit (from the past 24 hour(s)). --/--/A POS, A POS Performed at [facility], [place] [address], [place],  [address]   [phone])  Imaging:  No results found.  MAU Course/MDM: Orders Placed This Encounter  Procedures  . Discharge patient    No orders of the defined types were placed in this encounter.    Assessment: 1. Encounter for surgical wound dressing change   2. History of cesarean section     Plan: Dressing from yesterday removed. Cleaned and mild debridement with dry dressing and then packed with wet dressing. Overall, wound area appears and feels smaller than yesterday. Appears to be healing well. She has another appt with Dr. Elonda Husky tomorrow.  Discharge home  Future Appointments  Date Time Provider [place]  05/27/2019  8:50 AM Florian Buff, MD [facility]  06/17/2019 11:30 AM Roma Schanz, CNM [facility]    Allergies as of 05/26/2019      Reactions   Mushroom Extract Complex Anaphylaxis   Chocolate Itching   Oxycodone Diarrhea   Prednisone Other (See Comments)   hallucinations   Versed [midazolam] Hives   Hives after IV administration of zofran and versed. Unsure which medication caused reaction   Zofran [ondansetron Hcl] Hives   Hives after IV adminstration of zofran and versed. Unsure of which medication caused reaction.       Medication List    TAKE these medications   acetaminophen 325 MG tablet Commonly known as: TYLENOL Take 2 tablets (650 mg total) by mouth every 6 (six) hours as needed for mild pain (temperature > 101.5.).   amoxicillin-clavulanate 875-125 MG tablet Commonly known as: Augmentin Take 1 tablet by mouth 2 (two) times daily.   Blood Pressure Monitor Misc For regular home bp monitoring during pregnancy   cyclobenzaprine 5 MG tablet Commonly known as: FLEXERIL Take 1 tablet (5 mg total) by mouth 3 (three) times daily as needed for muscle spasms.   ferrous sulfate 325 (65 FE) MG tablet Commonly known as: FerrouSul Take 1 tablet (325 mg total) by mouth daily with breakfast.   ibuprofen 800 MG tablet Commonly known as:  ADVIL Take 1 tablet (800 mg total) by mouth every 6 (six) hours.   levothyroxine 150 MCG tablet Commonly known as: SYNTHROID Take 1 tablet (150 mcg total) by mouth daily before breakfast.   prenatal multivitamin Tabs tablet Take 1 tablet by mouth daily at 12 noon.   traMADol 50 MG tablet Commonly known as: ULTRAM Take 1 tablet (50 mg total) by mouth every 6 (six) hours as needed for moderate pain or severe pain.   Vitamin D3 125 MCG (5000 UT) Caps Take 1 capsule (5,000 Units total) by mouth daily.  Barrington Ellison, MD [facility] Fellow, [facility] 05/26/2019 8:42 AM

## 2019-05-27 ENCOUNTER — Encounter: Payer: Self-pay | Admitting: Obstetrics & Gynecology

## 2019-05-27 ENCOUNTER — Ambulatory Visit (INDEPENDENT_AMBULATORY_CARE_PROVIDER_SITE_OTHER): Payer: Medicaid Other | Admitting: Obstetrics & Gynecology

## 2019-05-27 VITALS — BP 105/68 | HR 78 | Ht <= 58 in | Wt 178.0 lb

## 2019-05-27 DIAGNOSIS — Z98891 History of uterine scar from previous surgery: Secondary | ICD-10-CM

## 2019-05-27 DIAGNOSIS — T8131XS Disruption of external operation (surgical) wound, not elsewhere classified, sequela: Secondary | ICD-10-CM

## 2019-05-30 ENCOUNTER — Ambulatory Visit (INDEPENDENT_AMBULATORY_CARE_PROVIDER_SITE_OTHER): Payer: Medicaid Other | Admitting: Obstetrics & Gynecology

## 2019-05-30 ENCOUNTER — Encounter: Payer: Self-pay | Admitting: Obstetrics & Gynecology

## 2019-05-30 ENCOUNTER — Other Ambulatory Visit: Payer: Self-pay

## 2019-05-30 VITALS — BP 108/75 | HR 75 | Ht <= 58 in | Wt 176.0 lb

## 2019-05-30 DIAGNOSIS — O902 Hematoma of obstetric wound: Secondary | ICD-10-CM

## 2019-05-30 DIAGNOSIS — T8131XS Disruption of external operation (surgical) wound, not elsewhere classified, sequela: Secondary | ICD-10-CM

## 2019-05-30 NOTE — Progress Notes (Signed)
  HPI: Patient returns for routine postoperative follow-up having undergone Caesarean section with post op hematoma on .  The patient's immediate postoperative recovery has been unremarkable. Since hospital discharge the patient reports improving.   Current Outpatient Medications: .  acetaminophen (TYLENOL) 325 MG tablet, Take 2 tablets (650 mg total) by mouth every 6 (six) hours as needed for mild pain (temperature > 101.5.)., Disp: 30 tablet, Rfl: 0 .  amoxicillin-clavulanate (AUGMENTIN) 875-125 MG tablet, Take 1 tablet by mouth 2 (two) times daily., Disp: 14 tablet, Rfl: 0 .  Blood Pressure Monitor MISC, For regular home bp monitoring during pregnancy, Disp: 1 each, Rfl: 0 .  Cholecalciferol (VITAMIN D3) 125 MCG (5000 UT) CAPS, Take 1 capsule (5,000 Units total) by mouth daily., Disp: 90 capsule, Rfl: 0 .  cyclobenzaprine (FLEXERIL) 5 MG tablet, Take 1 tablet (5 mg total) by mouth 3 (three) times daily as needed for muscle spasms., Disp: 20 tablet, Rfl: 0 .  ferrous sulfate (FERROUSUL) 325 (65 FE) MG tablet, Take 1 tablet (325 mg total) by mouth daily with breakfast., Disp: 90 tablet, Rfl: 3 .  ibuprofen (ADVIL) 800 MG tablet, Take 1 tablet (800 mg total) by mouth every 6 (six) hours., Disp: 30 tablet, Rfl: 0 .  levothyroxine (SYNTHROID) 150 MCG tablet, Take 1 tablet (150 mcg total) by mouth daily before breakfast., Disp: 30 tablet, Rfl: 3 .  Prenatal Vit-Fe Fumarate-FA (PRENATAL MULTIVITAMIN) TABS tablet, Take 1 tablet by mouth daily at 12 noon., Disp: , Rfl:  .  traMADol (ULTRAM) 50 MG tablet, Take 1 tablet (50 mg total) by mouth every 6 (six) hours as needed for moderate pain or severe pain., Disp: 30 tablet, Rfl: 0  No current facility-administered medications for this visit.    Blood pressure 108/75, pulse 75, height 4\' 6"  (1.372 m), weight 176 lb (79.8 kg), last menstrual period 08/14/2018, not currently breastfeeding.  Physical Exam: Left lateral incision is open healing  well Iodoform gauze removed, painted with gentian violet repacked with dry 4x4 fluffed partial gauze needed  Diagnostic Tests:   Pathology:   Impression: Wound hematoma, healing well, without evidence of infection  Plan: Return in 4 days for re eval Change dressing in 48 hours  Follow up: 4  days  Florian Buff, MD

## 2019-06-03 ENCOUNTER — Other Ambulatory Visit: Payer: Self-pay

## 2019-06-03 ENCOUNTER — Ambulatory Visit (INDEPENDENT_AMBULATORY_CARE_PROVIDER_SITE_OTHER): Payer: Medicaid Other | Admitting: Obstetrics and Gynecology

## 2019-06-03 ENCOUNTER — Encounter: Payer: Self-pay | Admitting: Obstetrics and Gynecology

## 2019-06-03 VITALS — BP 110/73 | HR 66 | Ht <= 58 in | Wt 177.0 lb

## 2019-06-03 DIAGNOSIS — O902 Hematoma of obstetric wound: Secondary | ICD-10-CM

## 2019-06-03 DIAGNOSIS — Z98891 History of uterine scar from previous surgery: Secondary | ICD-10-CM

## 2019-06-03 MED ORDER — NORELGESTROMIN-ETH ESTRADIOL 150-35 MCG/24HR TD PTWK: 1.0000 | MEDICATED_PATCH | TRANSDERMAL | 4 refills | Status: DC

## 2019-06-03 NOTE — Patient Instructions (Signed)
Hematoma A hematoma is a collection of blood. A hematoma can happen:  Under the skin.  In an organ.  In a body space.  In a joint space.  In other tissues. The blood can thicken (clot) to form a lump that you can see and feel. The lump is often hard and may become sore and tender. The lump can be very small or very big. Most hematomas get better in a few days to weeks. However, some hematomas may be serious and need medical care. What are the causes? This condition is caused by:  An injury.  Blood that leaks under the skin.  Problems from surgeries.  Medical conditions that cause bleeding or bruising. What increases the risk? You are more likely to develop this condition if:  You are an older adult.  You use medicines that thin your blood. What are the signs or symptoms? Symptoms depend on where the hematoma is in your body.  If the hematoma is under the skin, there is: ? A firm lump on the body. ? Pain and tenderness in the area. ? Bruising. The skin above the lump may be blue, dark blue, purple-red, or yellowish.  If the hematoma is deep in the tissues or body spaces, there may be: ? Blood in the stomach. This may cause pain in the belly (abdomen), weakness, passing out (fainting), and shortness of breath. ? Blood in the head. This may cause a headache, weakness, trouble speaking or understanding speech, or passing out. How is this diagnosed? This condition is diagnosed based on:  Your medical history.  A physical exam.  Imaging tests, such as ultrasound or CT scan.  Blood tests. How is this treated? Treatment depends on the cause, size, and location of the hematoma. Treatment may include:  Doing nothing. Many hematomas go away on their own without treatment.  Surgery or close monitoring. This may be needed for large hematomas or hematomas that affect the body's organs.  Medicines. These may be given if a medical condition caused the hematoma. Follow these  instructions at home: Managing pain, stiffness, and swelling   If told, put ice on the area. ? Put ice in a plastic bag. ? Place a towel between your skin and the bag. ? Leave the ice on for 20 minutes, 2-3 times a day for the first two days.  If told, put heat on the affected area after putting ice on the area for two days. Use the heat source that your doctor tells you to use. This could be a moist heat pack or a heating pad. To do this: ? Place a towel between your skin and the heat source. ? Leave the heat on for 20-30 minutes. ? Remove the heat if your skin turns bright red. This is very important if you are unable to feel pain, heat, or cold. You may have a greater risk of getting burned.  Raise (elevate) the affected area above the level of your heart while you are sitting or lying down.  Wrap the affected area with an elastic bandage, if told by your doctor. Do not wrap the bandage too tightly.  If your hematoma is on a leg or foot and is painful, your doctor may give you crutches. Use them as told by your doctor. General instructions  Take over-the-counter and prescription medicines only as told by your doctor.  Keep all follow-up visits as told by your doctor. This is important. Contact a doctor if:  You have a fever.    The swelling or bruising gets worse.  You start to get more hematomas. Get help right away if:  Your pain gets worse.  Your pain is not getting better with medicine.  Your skin over the hematoma breaks or starts to bleed.  Your hematoma is in your chest or belly and you: ? Pass out. ? Feel weak. ? Become short of breath.  You have a hematoma on your scalp that is caused by a fall or injury, and you: ? Have a headache that gets worse. ? Have trouble speaking or understanding speech. ? Become less alert or you pass out. Summary  A hematoma is a collection of blood in any part of your body.  Most hematomas get better on their own in a few days  to weeks. Some may need medical care.  Follow instructions from your doctor about how to care for your hematoma.  Contact a doctor if the swelling or bruising gets worse, or if you are short of breath. This information is not intended to replace advice given to you by your health care provider. Make sure you discuss any questions you have with your health care provider. Document Revised: 08/17/2017 Document Reviewed: 08/17/2017 Elsevier Patient Education  2020 Elsevier Inc[employer]  

## 2019-06-03 NOTE — Progress Notes (Signed)
   Subjective:  Hailey Thompson is a 24 y.o. female now 3 weeks status post Cesarean section with postop wound hematoma deep in the fatty space.   Excellent recovery to date were now down to a 2 cm wide by 1.5 cm deep defect that is clean plan.  Upon removal of the wet-to-dry gauze there is fresh surfaces with bright red bleeding and no purulence.  The deep spaces have closed up.  There is a small amount of firmness in the midline that may be long-term. Review of Systems Negative except desires birth control.  No fever chills or erythema.  Patient does note a small 2 cm area of hardness in the right outer upper arm where she received a shot sometime during her pregnancy care.  Has minimal local heat this is thought to be simply a local reaction to the shot   Diet:   reg   Bowel movements : normal.  The patient is not having any pain.  Objective:  BP 110/73 (BP Location: Right Arm, Patient Position: Sitting, Cuff Size: Normal)   Pulse 66   Ht 4\' 6"  (1.372 m)   Wt 177 lb (80.3 kg)   BMI 42.68 kg/m  General:Well developed, well nourished.  No acute distress. Abdomen: Bowel sounds normal, soft, non-tender. Pelvic Exam:  Incision(s): N/ARegular see HPI.  Healing well erythema is gone bruising is reducing rapidly wet-to-dry dressing removed and is only 2 cm wide by 1.5 cm deep surface dressing with 4 x 4's and tape positioned     Assessment:  Post-Op 3 weeks s/p Cesarean section   Improvement over the past week postoperatively.   Plan:  1.Wound care discussed may shower, dry surface dressings 2. . current medications.  No pain medication beyond ibuprofen 3. Activity restrictions: May shower 4. return to work: not applicable. 5. Follow up in 4 days. 6 Rx ortho-evra

## 2019-06-07 ENCOUNTER — Encounter: Payer: Self-pay | Admitting: Obstetrics and Gynecology

## 2019-06-07 ENCOUNTER — Ambulatory Visit (INDEPENDENT_AMBULATORY_CARE_PROVIDER_SITE_OTHER): Payer: Medicaid Other | Admitting: Obstetrics and Gynecology

## 2019-06-07 ENCOUNTER — Other Ambulatory Visit: Payer: Self-pay

## 2019-06-07 VITALS — BP 119/76 | HR 79 | Ht <= 58 in | Wt 175.6 lb

## 2019-06-07 DIAGNOSIS — Z09 Encounter for follow-up examination after completed treatment for conditions other than malignant neoplasm: Secondary | ICD-10-CM

## 2019-06-07 DIAGNOSIS — O902 Hematoma of obstetric wound: Secondary | ICD-10-CM

## 2019-06-07 NOTE — Progress Notes (Signed)
Patient ID: Hailey Thompson, female   DOB: [birth year], 24 y.o.   MRN: [id number]  Subjective:  Hailey Thompson is a 24 y.o. female now 4 weeks status post C-section and reassessment of hematoma near incision. She has some tenderness around area of incision. It has closed down to a tiny 5 mm area, that is producing clear  pinkish watery d/c only.  Nonpurulent. No fever.   Review of Systems Negative except hematoma   Diet:   normal   Bowel movements : normal.  Pain is controlled without any medications.  Objective:  BP 119/76 (BP Location: Right Arm, Patient Position: Sitting, Cuff Size: Normal)   Pulse 79   Ht 4\' 6"  (1.372 m)   Wt 175 lb 9.6 oz (79.7 kg)   BMI 42.34 kg/m  General:Well developed, well nourished.  No acute distress. Abdomen: Bowel sounds normal, soft, non-tender. Pelvic Exam: Deferred  Incision(s): Healing well, no drainage, no erythema, no hernia, no swelling, no dehiscence, serous fluid. Hematoma is healing adequately, bruising is reducing well.    Assessment:  Post-Op 2 weeks s/p C-section   Abdominal hemartoma; well healing   Plan:  1.Wound care discussed  Using sanitary pad to collected moisture 3. Activity restrictions: may shower, normal activity 4. return to work: not applicable. 5. Follow up in  2 wk routine visit final incision check By signing my name below, I, Samul Dada, attest that this documentation has been prepared under the direction and in the presence of Jonnie Kind, MD. Electronically Signed: [place]. 06/07/19. 12:22 PM.  I personally performed the services described in this documentation, which was SCRIBED in my presence. The recorded information has been reviewed and considered accurate. It has been edited as necessary during review. Jonnie Kind, MD

## 2019-06-12 ENCOUNTER — Other Ambulatory Visit: Payer: Self-pay | Admitting: Obstetrics and Gynecology

## 2019-06-12 MED ORDER — NORETHIN ACE-ETH ESTRAD-FE 1-20 MG-MCG(24) PO TABS: 1.0000 | ORAL_TABLET | Freq: Every day | ORAL | 11 refills | Status: DC

## 2019-06-12 NOTE — Telephone Encounter (Signed)
Done Rx Lo LoEStrin

## 2019-06-12 NOTE — Progress Notes (Signed)
rx lo loestrin 1/20  X 1 yr

## 2019-06-17 ENCOUNTER — Encounter: Payer: Self-pay | Admitting: Women's Health

## 2019-06-17 ENCOUNTER — Ambulatory Visit (INDEPENDENT_AMBULATORY_CARE_PROVIDER_SITE_OTHER): Payer: Medicaid Other | Admitting: Women's Health

## 2019-06-17 ENCOUNTER — Other Ambulatory Visit: Payer: Self-pay

## 2019-06-17 DIAGNOSIS — Z98891 History of uterine scar from previous surgery: Secondary | ICD-10-CM

## 2019-06-17 DIAGNOSIS — O99893 Other specified diseases and conditions complicating puerperium: Secondary | ICD-10-CM

## 2019-06-17 DIAGNOSIS — B372 Candidiasis of skin and nail: Secondary | ICD-10-CM

## 2019-06-17 NOTE — Progress Notes (Signed)
POSTPARTUM VISIT Patient name: Hailey Thompson Date MRN [id number]  Date of birth: [birth year] Chief Complaint:   Postpartum Care  History of Present Illness:   Hailey Thompson is a 24 y.o. [phone] Caucasian female being seen today for a postpartum visit. She is 5 weeks postpartum following a repeat cesarean section, low transverse incision at 39.0 gestational weeks. Anesthesia: spinal. Laceration: n/a. I have fully reviewed the prenatal and intrapartum course. Pregnancy uncomplicated. Postpartum course has been complicated by incisional hematoma. Bleeding spotting. Bowel function is normal. Bladder function is normal.  Patient is not sexually active. Last sexual activity: prior to birth of baby.  Contraception method is LoLoestrin rx'd by [facility] 3/17, pt has started taking.    Last pap 11/05/2018.  Results were normal .  No LMP recorded.  Baby's course has been uncomplicated. Baby is feeding by bottle    Edinburgh Postpartum Depression Screening: negative Edinburgh Postnatal Depression Scale - 06/17/19 1138      Edinburgh Postnatal Depression Scale:  In the Past 7 Days   I have been able to laugh and see the funny side of things.  0    I have looked forward with enjoyment to things.  0    I have blamed myself unnecessarily when things went wrong.  0    I have been anxious or worried for no good reason.  0    I have felt scared or panicky for no good reason.  0    Things have been getting on top of me.  0    I have been so unhappy that I have had difficulty sleeping.  0    I have felt sad or miserable.  0    I have been so unhappy that I have been crying.  0    The thought of harming myself has occurred to me.  0    Edinburgh Postnatal Depression Scale Total  0      Review of Systems:   Pertinent items are noted in HPI Denies Abnormal vaginal discharge w/ itching/odor/irritation, headaches, visual changes, shortness of breath, chest pain, abdominal pain, severe nausea/vomiting, or problems with  urination or bowel movements. Pertinent History Reviewed:  Reviewed past medical,surgical, obstetrical and family history.  Reviewed problem list, medications and allergies. OB History  Gravida Para Term Preterm AB Living  4 2 2   2 2   SAB TAB Ectopic Multiple Live Births  2     0 2    # Outcome Date GA Lbr Len/2nd Weight Sex Delivery Anes PTL Lv  4 Term 05/14/19 [redacted]w[redacted]d  7 lb 1.2 oz (3.21 kg) F CS-LTranv Spinal  LIV  3 Term 05/05/14 [redacted]w[redacted]d  6 lb 14.7 oz (3.138 kg) M CS-LTranv EPI  LIV     Complications: Breech presentation  2 SAB           1 SAB             Obstetric Comments  Pt has had 2-3 miscarriages in the past.    Physical Assessment:   Vitals:   06/17/19 1139  BP: 99/64  Pulse: 60  Weight: 179 lb 3.2 oz (81.3 kg)  Height: 4\' 6"  (1.372 m)  Body mass index is 43.21 kg/m.       Physical Examination:   General appearance: alert, well appearing, and in no distress  Mental status: alert, oriented to person, place, and time  Skin: warm & dry   Cardiovascular: normal heart rate noted   Respiratory: normal respiratory  effort, no distress   Breasts: deferred, no complaints   Abdomen: soft, non-tender, c/s incision healing well, some dependent edema in panus/could be residual from hematoma. Lt side incision demarcated erythematous, moist c/w yeast- painted w/ gentian violet  Pelvic: examination not indicated  Rectal: not examined   Extremities: no edema       No results found for this or any previous visit (from the past 24 hour(s)).  Assessment & Plan:  1) Postpartum exam 2) 5 wks s/p RCS 3) Bottlefeeding 4) Depression screening 5) Contraception already taking LoLoestrin 6) Incisional candida> painted w/ gentian violet, keep clean & dry w/ peri pad to incision to help wick moisture  Meds: No orders of the defined types were placed in this encounter.   Follow-up: Return in about 1 year (around 06/16/2020) for Physical.   No orders of the defined types were placed in  this encounter.   [place], [employer] 06/17/2019 12:03 PM

## 2019-06-18 ENCOUNTER — Ambulatory Visit: Payer: Medicaid Other | Admitting: Women's Health

## 2019-06-24 ENCOUNTER — Other Ambulatory Visit: Payer: Self-pay

## 2019-06-24 ENCOUNTER — Encounter: Payer: Self-pay | Admitting: Obstetrics & Gynecology

## 2019-06-24 ENCOUNTER — Ambulatory Visit (INDEPENDENT_AMBULATORY_CARE_PROVIDER_SITE_OTHER): Payer: Medicaid Other | Admitting: Obstetrics & Gynecology

## 2019-06-24 VITALS — BP 100/62 | HR 72 | Ht <= 58 in | Wt 177.0 lb

## 2019-06-24 DIAGNOSIS — Z5189 Encounter for other specified aftercare: Secondary | ICD-10-CM

## 2019-06-24 NOTE — Progress Notes (Signed)
  HPI: Patient returns for routine postoperative follow-up having undergone repeat c section 6 weeks ago on .  The patient's immediate postoperative recovery has been unremarkable. Since hospital discharge the patient reports .   Current Outpatient Medications: .  ferrous sulfate (FERROUSUL) 325 (65 FE) MG tablet, Take 1 tablet (325 mg total) by mouth daily with breakfast., Disp: 90 tablet, Rfl: 3 .  ibuprofen (ADVIL) 800 MG tablet, Take 1 tablet (800 mg total) by mouth every 6 (six) hours., Disp: 30 tablet, Rfl: 0 .  levothyroxine (SYNTHROID) 150 MCG tablet, Take 1 tablet (150 mcg total) by mouth daily before breakfast., Disp: 30 tablet, Rfl: 3 .  Cholecalciferol (VITAMIN D3) 125 MCG (5000 UT) CAPS, Take 1 capsule (5,000 Units total) by mouth daily., Disp: 90 capsule, Rfl: 0 .  Norethindrone Acetate-Ethinyl Estrad-FE (LOESTRIN 24 FE) 1-20 MG-MCG(24) tablet, Take 1 tablet by mouth daily. (Patient not taking: Reported on 06/24/2019), Disp: 1 Package, Rfl: 11 .  Prenatal Vit-Fe Fumarate-FA (PRENATAL MULTIVITAMIN) TABS tablet, Take 1 tablet by mouth daily at 12 noon., Disp: , Rfl:   No current facility-administered medications for this visit.    Blood pressure 100/62, pulse 72, height 4\' 6"  (1.372 m), weight 177 lb (80.3 kg), not currently breastfeeding.  Physical Exam: Incision with 1 small 2 mm skin separation nothing infected or fluid/blood collection Overall well healed  Diagnostic Tests:   Pathology:   Impression: Small skin separation  Plan: Keep clean dry  Follow up: prn    Florian Buff, MD

## 2019-07-01 ENCOUNTER — Telehealth: Payer: Self-pay | Admitting: "Endocrinology

## 2019-07-01 ENCOUNTER — Other Ambulatory Visit: Payer: Self-pay

## 2019-07-01 ENCOUNTER — Other Ambulatory Visit (HOSPITAL_COMMUNITY)
Admission: RE | Admit: 2019-07-01 | Discharge: 2019-07-01 | Disposition: A | Discharge status: Home / Self Care | Payer: Medicaid Other | Source: Ambulatory Visit | Attending: "Endocrinology | Admitting: "Endocrinology

## 2019-07-01 DIAGNOSIS — E038 Other specified hypothyroidism: Secondary | ICD-10-CM | POA: Diagnosis not present

## 2019-07-01 DIAGNOSIS — R5383 Other fatigue: Secondary | ICD-10-CM | POA: Insufficient documentation

## 2019-07-01 DIAGNOSIS — E063 Autoimmune thyroiditis: Secondary | ICD-10-CM | POA: Insufficient documentation

## 2019-07-01 DIAGNOSIS — E559 Vitamin D deficiency, unspecified: Secondary | ICD-10-CM | POA: Insufficient documentation

## 2019-07-01 LAB — CBC WITH DIFFERENTIAL/PLATELET
Abs Immature Granulocytes: 0.06 10*3/uL (ref 0.00–0.07)
Basophils Absolute: 0 10*3/uL (ref 0.0–0.1)
Basophils Relative: 0 %
Eosinophils Absolute: 0.4 10*3/uL (ref 0.0–0.5)
Eosinophils Relative: 3 %
HCT: 36.6 % (ref 36.0–46.0)
Hemoglobin: 11.3 g/dL — ABNORMAL LOW (ref 12.0–15.0)
Immature Granulocytes: 1 %
Lymphocytes Relative: 17 %
Lymphs Abs: 2.1 10*3/uL (ref 0.7–4.0)
MCH: 29 pg (ref 26.0–34.0)
MCHC: 30.9 g/dL (ref 30.0–36.0)
MCV: 93.8 fL (ref 80.0–100.0)
Monocytes Absolute: 0.5 10*3/uL (ref 0.1–1.0)
Monocytes Relative: 4 %
Neutro Abs: 9.3 10*3/uL — ABNORMAL HIGH (ref 1.7–7.7)
Neutrophils Relative %: 75 %
Platelets: 239 10*3/uL (ref 150–400)
RBC: 3.9 MIL/uL (ref 3.87–5.11)
RDW: 12.5 % (ref 11.5–15.5)
WBC: 12.4 10*3/uL — ABNORMAL HIGH (ref 4.0–10.5)
nRBC: 0 % (ref 0.0–0.2)

## 2019-07-01 LAB — VITAMIN D 25 HYDROXY (VIT D DEFICIENCY, FRACTURES): Vit D, 25-Hydroxy: 19.82 ng/mL — ABNORMAL LOW (ref 30–100)

## 2019-07-01 LAB — T4, FREE: Free T4: 1.79 ng/dL — ABNORMAL HIGH (ref 0.61–1.12)

## 2019-07-01 LAB — TSH: TSH: 0.106 u[IU]/mL — ABNORMAL LOW (ref 0.350–4.500)

## 2019-07-01 NOTE — Telephone Encounter (Signed)
We need to lower her dose, advise her to keep her next appointment, move up her appt if possible

## 2019-07-01 NOTE — Telephone Encounter (Signed)
Pt said she had her labs done. Some of them are abnormal. She said she had a higher dose when she was pregnant. Do you think she is okay until she can be seen in two weeks?

## 2019-07-08 ENCOUNTER — Other Ambulatory Visit: Payer: Self-pay

## 2019-07-08 ENCOUNTER — Ambulatory Visit (INDEPENDENT_AMBULATORY_CARE_PROVIDER_SITE_OTHER): Payer: Medicaid Other | Admitting: "Endocrinology

## 2019-07-08 ENCOUNTER — Encounter: Payer: Self-pay | Admitting: "Endocrinology

## 2019-07-08 VITALS — BP 93/64 | HR 61 | Ht <= 58 in | Wt 177.4 lb

## 2019-07-08 DIAGNOSIS — D500 Iron deficiency anemia secondary to blood loss (chronic): Secondary | ICD-10-CM | POA: Insufficient documentation

## 2019-07-08 DIAGNOSIS — E038 Other specified hypothyroidism: Secondary | ICD-10-CM

## 2019-07-08 DIAGNOSIS — E063 Autoimmune thyroiditis: Secondary | ICD-10-CM

## 2019-07-08 DIAGNOSIS — E559 Vitamin D deficiency, unspecified: Secondary | ICD-10-CM

## 2019-07-08 MED ORDER — VITAMIN D (ERGOCALCIFEROL) 1.25 MG (50000 UNIT) PO CAPS: 50000.0000 [IU] | ORAL_CAPSULE | ORAL | 0 refills | Status: DC

## 2019-07-08 MED ORDER — LEVOTHYROXINE SODIUM 125 MCG PO TABS: 125.0000 ug | ORAL_TABLET | Freq: Every day | ORAL | 2 refills | Status: DC

## 2019-07-08 NOTE — Progress Notes (Signed)
07/08/2019          Endocrinology follow-up note   Subjective:    Patient ID: Hailey Thompson, female    DOB: [birth year], PCP Muse, Noel Journey., PA-C   Past Medical History:  Diagnosis Date  . Hypothyroidism   . Migraine   . Thyroid disease   . UTI (lower urinary tract infection)    Past Surgical History:  Procedure Laterality Date  . APPENDECTOMY    . CESAREAN SECTION    . CESAREAN SECTION N/A 05/14/2019   Procedure: CESAREAN SECTION;  Surgeon: Jonnie Kind, MD;  Location: [facility];  Service: Obstetrics;  Laterality: N/A;  . CHOLECYSTECTOMY N/A 11/06/2015   Procedure: LAPAROSCOPIC CHOLECYSTECTOMY;  Surgeon: Vickie Epley, MD;  Location: [facility];  Service: General;  Laterality: N/A;   Social History   Socioeconomic History  . Marital status: Married    Spouse name: dustin  . Number of children: 1  . Years of education: 64  . Highest education level: 10th grade  Occupational History  . Occupation: homemaker  Tobacco Use  . Smoking status: Never Smoker  . Smokeless tobacco: Never Used  Substance and Sexual Activity  . Alcohol use: No  . Drug use: No  . Sexual activity: Not Currently    Birth control/protection: None  Other Topics Concern  . Not on file  Social History Narrative  . Not on file   Social Determinants of Health   Financial Resource Strain: Low Risk   . Difficulty of Paying Living Expenses: Not very hard  Food Insecurity: Unknown  . Worried About Charity fundraiser in the Last Year: Never true  . Ran Out of Food in the Last Year: Not on file  Transportation Needs: No Transportation Needs  . Lack of Transportation (Medical): No  . Lack of Transportation (Non-Medical): No  Physical Activity: Unknown  . Days of Exercise per Week: 3 days  . Minutes of Exercise per Session: Not on file  Stress: No Stress Concern Present  . Feeling of Stress : Only a little  Social Connections: Slightly Isolated  . Frequency of Communication with Friends and  Family: More than three times a week  . Frequency of Social Gatherings with Friends and Family: Twice a week  . Attends Religious Services: More than 4 times per year  . Active Member of Clubs or Organizations: No  . Attends Archivist Meetings: Never  . Marital Status: Married   Outpatient Encounter Medications as of 07/08/2019  Medication Sig  . Cholecalciferol (VITAMIN D3) 125 MCG (5000 UT) CAPS Take 1 capsule (5,000 Units total) by mouth daily.  . ferrous sulfate (FERROUSUL) 325 (65 FE) MG tablet Take 1 tablet (325 mg total) by mouth daily with breakfast.  . ibuprofen (ADVIL) 800 MG tablet Take 1 tablet (800 mg total) by mouth every 6 (six) hours.  Mar[place] Kitchen levothyroxine (SYNTHROID) 125 MCG tablet Take 1 tablet (125 mcg total) by mouth daily before breakfast.  . Norethindrone Acetate-Ethinyl Estrad-FE (LOESTRIN 24 FE) 1-20 MG-MCG(24) tablet Take 1 tablet by mouth daily. (Patient not taking: Reported on 06/24/2019)  . Prenatal Vit-Fe Fumarate-FA (PRENATAL MULTIVITAMIN) TABS tablet Take 1 tablet by mouth daily at 12 noon.  . Vitamin D, Ergocalciferol, (DRISDOL) 1.25 MG (50000 UNIT) CAPS capsule Take 1 capsule (50,000 Units total) by mouth every 7 (seven) days.  . [DISCONTINUED] famotidine (PEPCID) 20 MG tablet Take 1 tablet (20 mg total) by mouth 2 (two) times daily.  . [DISCONTINUED] levothyroxine (SYNTHROID)  150 MCG tablet Take 1 tablet (150 mcg total) by mouth daily before breakfast.   No facility-administered encounter medications on file as of 07/08/2019.   ALLERGIES: Allergies  Allergen Reactions  . Mushroom Extract Complex Anaphylaxis  . Chocolate Itching  . Oxycodone Diarrhea  . Prednisone Other (See Comments)    hallucinations  . Versed [Midazolam] Hives    Hives after IV administration of zofran and versed. Unsure which medication caused reaction  . Zofran [Ondansetron Hcl] Hives    Hives after IV adminstration of zofran and versed. Unsure of which medication caused  reaction.     VACCINATION STATUS: Immunization History  Administered Date(s) Administered  . Influenza Whole 02/14/2011  . Influenza,inj,Quad PF,6+ Mos 03/18/2019  . Tdap 03/18/2019    HPI 24 year old female patient with medical history as above.  She is being seen in follow-up for hypothyroidism due to Hashimoto's thyroiditis, vitamin D deficiency.  She is also on treatment with iron supplement for iron deficiency anemia.  Since her last visit, the dose of her levothyroxine was increased to 150 mcg p.o. daily before breakfast related to her successful pregnancy.  She is now 2 months postpartum.  She reports with better compliance with this medication at this time. - She reports that she was diagnosed with hypothyroidism at age 75 years, treated with various doses of levothyroxine over the years. -In the past, there has been documented inconsistencies in her intake of his thyroid hormone .    -She has no new complaints today. - She has family history of hypothyroidism in her mother. She denies family history of thyroid cancer. She denies any personal history of goiter.   Review of Systems Limited as above.    Objective:    BP 93/64   Pulse 61   Ht 4\' 6"  (1.372 m)   Wt 177 lb 6.4 oz (80.5 kg)   BMI 42.77 kg/m   Wt Readings from Last 3 Encounters:  07/08/19 177 lb 6.4 oz (80.5 kg)  06/24/19 177 lb (80.3 kg)  06/17/19 179 lb 3.2 oz (81.3 kg)    Physical Exam- Limited  Constitutional:  Body mass index is 42.77 kg/m. , not in acute distress, normal state of mind Eyes:  EOMI, no exophthalmos Neck: Supple Thyroid: No gross goiter Respiratory: Adequate breathing efforts Musculoskeletal: no gross deformities, strength intact in all four extremities, no gross restriction of joint movements Skin:  no rashes, no hyperemia Neurological: no tremor with outstretched hands,    CMP     Component Value Date/Time   NA 136 05/12/2019 0850   K 3.9 05/12/2019 0850   CL 107 05/12/2019  0850   CO2 20 (L) 05/12/2019 0850   GLUCOSE 86 05/12/2019 0850   BUN 10 05/12/2019 0850   CREATININE 0.82 05/14/2019 1222   CREATININE 0.56 07/23/2013 0815   CALCIUM 8.4 (L) 05/12/2019 0850   CALCIUM 8.9 07/19/2011 1547   PROT 6.5 05/12/2019 0850   ALBUMIN 1.9 (L) 05/12/2019 0850   AST 26 05/12/2019 0850   ALT 22 05/12/2019 0850   ALKPHOS 138 (H) 05/12/2019 0850   BILITOT 0.5 05/12/2019 0850   GFRNONAA >60 05/14/2019 1222   GFRAA >60 05/14/2019 1222     Diabetic Labs (most recent): Lab Results  Component Value Date   HGBA1C 5.0 02/04/2017   HGBA1C 5.1 07/16/2012   HGBA1C 5.4 10/25/2011     Lipid Panel ( most recent) Lipid Panel     Component Value Date/Time   CHOL 156 07/23/2013 0815  TRIG 131 07/23/2013 0815   HDL 42 07/23/2013 0815   CHOLHDL 3.7 07/23/2013 0815   VLDL 26 07/23/2013 0815   LDLCALC 88 07/23/2013 0815   Recent Results (from the past 2160 hour(s))  POC Urinalysis Dipstick OB     Status: None   Collection Time: 04/15/19  2:43 PM  Result Value Ref Range   Color, UA     Clarity, UA     Glucose, UA Negative Negative   Bilirubin, UA     Ketones, UA neg    Spec Grav, UA     Blood, UA neg    pH, UA     POC,PROTEIN,UA Negative Negative, Trace, Small (1+), Moderate (2+), Large (3+), 4+   Urobilinogen, UA     Nitrite, UA neg    Leukocytes, UA Negative Negative   Appearance     Odor    Novel Coronavirus, NAA ([facility])     Status: Abnormal   Collection Time: 04/16/19  2:43 PM   Specimen: Nasopharyngeal(NP) swabs in vial transport medium   NASOPHARYNGE  TESTING  Result Value Ref Range   SARS-CoV-2, NAA Detected (A) Not Detected    Comment: This nucleic acid amplification test was developed and its performance characteristics determined by [employer]. Nucleic acid amplification tests include RT-PCR and TMA. This test has not been FDA cleared or approved. This test has been authorized by FDA under an Emergency Use Authorization (EUA). This  test is only authorized for the duration of time the declaration that circumstances exist justifying the authorization of the emergency use of in vitro diagnostic tests for detection of SARS-CoV-2 virus and/or diagnosis of COVID-19 infection under section 564(b)(1) of the Act, 21 U.S.C. 387FIE-3(P) (1), unless the authorization is terminated or revoked sooner. When diagnostic testing is negative, the possibility of a false negative result should be considered in the context of a patient's recent exposures and the presence of clinical signs and symptoms consistent with COVID-19. An individual without symptoms of COVID-19 and who is not shedding SARS-CoV-2 virus wo uld expect to have a negative (not detected) result in this assay.   Urinalysis, Routine w reflex microscopic     Status: Abnormal   Collection Time: 04/24/19 12:09 AM  Result Value Ref Range   Color, Urine YELLOW YELLOW   APPearance HAZY (A) CLEAR   Specific Gravity, Urine 1.009 1.005 - 1.030   pH 5.0 5.0 - 8.0   Glucose, UA NEGATIVE NEGATIVE mg/dL   Hgb urine dipstick NEGATIVE NEGATIVE   Bilirubin Urine NEGATIVE NEGATIVE   Ketones, ur NEGATIVE NEGATIVE mg/dL   Protein, ur NEGATIVE NEGATIVE mg/dL   Nitrite NEGATIVE NEGATIVE   Leukocytes,Ua NEGATIVE NEGATIVE    Comment: Performed at [facility] [address] [place], [place] [address]  POC Urinalysis Dipstick OB     Status: None   Collection Time: 04/29/19 10:08 AM  Result Value Ref Range   Color, UA     Clarity, UA     Glucose, UA Negative Negative   Bilirubin, UA     Ketones, UA neg    Spec Grav, UA     Blood, UA neg    pH, UA     POC,PROTEIN,UA Negative Negative, Trace, Small (1+), Moderate (2+), Large (3+), 4+   Urobilinogen, UA     Nitrite, UA neg    Leukocytes, UA Negative Negative   Appearance     Odor    Cervicovaginal ancillary only( [place])     Status:  None   Collection Time: 04/29/19 11:54 AM  Result Value Ref Range   Neisseria  Gonorrhea Negative    Chlamydia Negative    Comment Normal Reference [place] Chlamydia - Negative    Comment      Normal Reference Range Neisseria Gonorrhea - Negative  Culture, beta strep (group b only)     Status: Abnormal   Collection Time: 04/29/19  1:26 PM   Specimen: Vaginal/Rectal; Genital   VR  Result Value Ref Range   Strep Gp B Culture Positive (A) Negative    Comment: Centers for Disease Control and Prevention (CDC) and American Congress of Obstetricians and Gynecologists (ACOG) guidelines for prevention of perinatal group B streptococcal (GBS) disease specify co-collection of a vaginal and rectal swab specimen to maximize sensitivity of GBS detection. Per the CDC and ACOG, swabbing both the lower vagina and rectum substantially increases the yield of detection compared with sampling the vagina alone. Penicillin G, ampicillin, or cefazolin are indicated for intrapartum prophylaxis of perinatal GBS colonization. Reflex susceptibility testing should be performed prior to use of clindamycin only on GBS isolates from penicillin-allergic women who are considered a high risk for anaphylaxis. Treatment with vancomycin without additional testing is warranted if resistance to clindamycin is noted.   POCT fern test     Status: Normal   Collection Time: 05/07/19  2:11 PM  Result Value Ref Range   POCT Fern Test Negative = intact amniotic membranes   Amnisure rupture of membrane (rom)not at [facility]     Status: None   Collection Time: 05/07/19  3:26 PM  Result Value Ref Range   Amnisure ROM NEGATIVE     Comment: Performed at [facility], [place] [address], [place], [place] [address]  POC Urinalysis Dipstick OB     Status: Abnormal   Collection Time: 05/09/19  2:32 PM  Result Value Ref Range   Color, UA     Clarity, UA     Glucose, UA Negative Negative   Bilirubin, UA     Ketones, UA neg    Spec Grav, UA     Blood, UA neg    pH, UA     POC,PROTEIN,UA Negative Negative,  Trace, Small (1+), Moderate (2+), Large (3+), 4+   Urobilinogen, UA     Nitrite, UA neg    Leukocytes, UA Trace (A) Negative   Appearance     Odor    CBC     Status: Abnormal   Collection Time: 05/12/19  8:50 AM  Result Value Ref Range   WBC 9.9 4.0 - 10.5 K/uL   RBC 3.93 3.87 - 5.11 MIL/uL   Hemoglobin 11.9 (L) 12.0 - 15.0 g/dL   HCT 36.0 36.0 - 46.0 %   MCV 91.6 80.0 - 100.0 fL   MCH 30.3 26.0 - 34.0 pg   MCHC 33.1 30.0 - 36.0 g/dL   RDW 13.5 11.5 - 15.5 %   Platelets 171 150 - 400 K/uL   nRBC 0.0 0.0 - 0.2 %    Comment: Performed at [facility], [place] [address] [place], [place] [address]  Comprehensive metabolic panel     Status: Abnormal   Collection Time: 05/12/19  8:50 AM  Result Value Ref Range   Sodium 136 135 - 145 mmol/L   Potassium 3.9 3.5 - 5.1 mmol/L   Chloride 107 98 - 111 mmol/L   CO2 20 (L) 22 - 32 mmol/L   Glucose, Bld 86 70 - 99 mg/dL  BUN 10 6 - 20 mg/dL   Creatinine, Ser 0.94 0.44 - 1.00 mg/dL   Calcium 8.4 (L) 8.9 - 10.3 mg/dL   Total Protein 6.5 6.5 - 8.1 g/dL   Albumin 1.9 (L) 3.5 - 5.0 g/dL   AST 26 15 - 41 U/L   ALT 22 0 - 44 U/L   Alkaline Phosphatase 138 (H) 38 - 126 U/L   Total Bilirubin 0.5 0.3 - 1.2 mg/dL   GFR calc non Af Amer >60 >60 mL/min   GFR calc Af Amer >60 >60 mL/min   Anion gap 9 5 - 15    Comment: Performed at [facility] [address] [place], [place] [address]  RPR     Status: None   Collection Time: 05/12/19  8:50 AM  Result Value Ref Range   RPR Ser Ql NON REACTIVE NON REACTIVE    Comment: Performed at [facility], [place] [address] [place], [place] [address]  Type and screen [place]     Status: None   Collection Time: 05/12/19  8:53 AM  Result Value Ref Range   ABO/RH(D) A POS    Antibody Screen NEG    Sample Expiration      05/15/2019,2359 Performed at [facility], [place] [address] [place], [place] [address]   ABO/Rh     Status: None   Collection Time: 05/12/19   8:53 AM  Result Value Ref Range   ABO/RH(D)      A POS Performed at [facility] [address], [place], [place] [address]   CBC     Status: Abnormal   Collection Time: 05/14/19 12:22 PM  Result Value Ref Range   WBC 16.0 (H) 4.0 - 10.5 K/uL   RBC 4.04 3.87 - 5.11 MIL/uL   Hemoglobin 12.1 12.0 - 15.0 g/dL   HCT 36.7 36.0 - 46.0 %   MCV 90.8 80.0 - 100.0 fL   MCH 30.0 26.0 - 34.0 pg   MCHC 33.0 30.0 - 36.0 g/dL   RDW 13.5 11.5 - 15.5 %   Platelets 188 150 - 400 K/uL   nRBC 0.0 0.0 - 0.2 %    Comment: Performed at [facility], [place] [address] [place], [place] [address]  Creatinine, serum     Status: None   Collection Time: 05/14/19 12:22 PM  Result Value Ref Range   Creatinine, Ser 0.82 0.44 - 1.00 mg/dL   GFR calc non Af Amer >60 >60 mL/min   GFR calc Af Amer >60 >60 mL/min    Comment: Performed at [facility] [address] [place], [place] [address]  CBC     Status: Abnormal   Collection Time: 05/15/19  5:08 AM  Result Value Ref Range   WBC 15.7 (H) 4.0 - 10.5 K/uL   RBC 3.01 (L) 3.87 - 5.11 MIL/uL   Hemoglobin 9.1 (L) 12.0 - 15.0 g/dL    Comment: REPEATED TO VERIFY   HCT 28.1 (L) 36.0 - 46.0 %   MCV 93.4 80.0 - 100.0 fL   MCH 30.2 26.0 - 34.0 pg   MCHC 32.4 30.0 - 36.0 g/dL   RDW 13.8 11.5 - 15.5 %   Platelets 160 150 - 400 K/uL   nRBC 0.0 0.0 - 0.2 %    Comment: Performed at [facility], [place] [address], [place], [place] [address]  VITAMIN D 25 Hydroxy (Vit-D Deficiency, Fractures)  Status: Abnormal   Collection Time: 07/01/19 11:08 AM  Result Value Ref Range   Vit D, 25-Hydroxy 19.82 (L) 30 - 100 ng/mL    Comment: (NOTE) Vitamin D deficiency has been defined by the [place] practice guideline as a level of serum 25-OH  vitamin D less than 20 ng/mL (1,2). The Endocrine Society went on to  further define vitamin D insufficiency as a level between 21 and 29  ng/mL (2). 1. IOM (Institute of  Medicine). 2010. Dietary reference intakes for  calcium and D. [place]: The [employer]. 2. Holick MF, [place], Bischoff-Ferrari HA, et al. Evaluation,  treatment, and prevention of vitamin D deficiency: an Endocrine  Society clinical practice guideline, JCEM. 2011 Jul; 96(7): 1911-30. Performed at [facility], [place] [address] [place], [place] [address]   CBC with Differential/Platelet     Status: Abnormal   Collection Time: 07/01/19 11:08 AM  Result Value Ref Range   WBC 12.4 (H) 4.0 - 10.5 K/uL   RBC 3.90 3.87 - 5.11 MIL/uL   Hemoglobin 11.3 (L) 12.0 - 15.0 g/dL   HCT 36.6 36.0 - 46.0 %   MCV 93.8 80.0 - 100.0 fL   MCH 29.0 26.0 - 34.0 pg   MCHC 30.9 30.0 - 36.0 g/dL   RDW 12.5 11.5 - 15.5 %   Platelets 239 150 - 400 K/uL   nRBC 0.0 0.0 - 0.2 %   Neutrophils Relative % 75 %   Neutro Abs 9.3 (H) 1.7 - 7.7 K/uL   Lymphocytes Relative 17 %   Lymphs Abs 2.1 0.7 - 4.0 K/uL   Monocytes Relative 4 %   Monocytes Absolute 0.5 0.1 - 1.0 K/uL   Eosinophils Relative 3 %   Eosinophils Absolute 0.4 0.0 - 0.5 K/uL   Basophils Relative 0 %   Basophils Absolute 0.0 0.0 - 0.1 K/uL   Immature Granulocytes 1 %   Abs Immature Granulocytes 0.06 0.00 - 0.07 K/uL    Comment: Performed at [facility], [address], [place], [place] [address]  T4, free     Status: Abnormal   Collection Time: 07/01/19 11:08 AM  Result Value Ref Range   Free T4 1.79 (H) 0.61 - 1.12 ng/dL    Comment: (NOTE) Biotin ingestion may interfere with free T4 tests. If the results are inconsistent with the TSH level, previous test results, or the clinical presentation, then consider biotin interference. If needed, order repeat testing after stopping biotin. Performed at [facility], [place] [address], [place], [place] [address]   TSH     Status: Abnormal   Collection Time: 07/01/19 11:08 AM  Result Value Ref Range   TSH 0.106 (L) 0.350 - 4.500 uIU/mL    Comment: Performed by a 3rd  Generation assay with a functional sensitivity of <=0.01 uIU/mL. Performed at [facility], [address] [place],  [address]      Assessment & Plan:   1. Hypothyroidism due to Hashimoto's thyroiditis 2.  anemia-chronic blood loss 3.  Vitamin D deficiency -Her previsit thyroid function tests are consistent with over replacement.  I discussed and lowered her levothyroxine to 125 mcg p.o. daily before breakfast.     - We discussed about the correct intake of her thyroid hormone, on empty stomach at fasting, with water, separated by at least 30 minutes from breakfast and other medications,  and separated by more than 4 hours from calcium, iron, multivitamins,  acid reflux medications (PPIs). -Patient is made aware of the fact that thyroid hormone replacement is needed for life, dose to be adjusted by periodic monitoring of thyroid function tests.  -Her recent labs show hemoglobin of 11.3, improving from 8.2.  She will continue to benefit from low-dose iron supplement.  -She is advised to continue ferrous sulfate 325 mg p.o. once a day.  I discussed and initiated vitamin D2 50,000 units weekly for 12 weeks, and maintain afterwards with vitamin D3 5000 units daily.   -She does not have diabetes/prediabetes, her recent A1c was 5%.   - she  admits there is a room for improvement in her diet and drink choices. -  Suggestion is made for her to avoid simple carbohydrates  from her diet including Cakes, Sweet Desserts / Pastries, Ice Cream, Soda (diet and regular), Sweet Tea, Candies, Chips, Cookies, Sweet Pastries,  Store Bought Juices, Alcohol in Excess of  1-2 drinks a day, Artificial Sweeteners, Coffee Creamer, and "Sugar-free" Products. This will help patient to have stable blood glucose profile and potentially avoid unintended weight gain.   She is advised to continue close follow-up with her PCP [facility], PA-C.     - Time spent on this patient care encounter:  25 minutes of which  50% was spent in  counseling and the rest reviewing  her current and  previous labs / studies and medications  doses and developing a plan for long term care. Hailey Thompson  participated in the discussions, expressed understanding, and voiced agreement with the above plans.  All questions were answered to her satisfaction. she is encouraged to contact clinic should she have any questions or concerns prior to her return visit.    Follow up plan: Return in about 3 months (around 10/07/2019) for Follow up with Pre-visit Labs.  Glade Lloyd, MD Phone: [phone]  Fax: [phone]  -  This note was partially dictated with voice recognition software. Similar sounding words can be transcribed inadequately or may not  be corrected upon review.  07/08/2019, 1:10 PM

## 2019-07-15 ENCOUNTER — Ambulatory Visit: Payer: Medicaid Other | Admitting: "Endocrinology

## 2019-07-30 ENCOUNTER — Other Ambulatory Visit: Payer: Self-pay | Admitting: "Endocrinology

## 2019-09-09 NOTE — Progress Notes (Signed)
  HPI: Patient returns for routine postoperative follow-up having undergone c section repeat small post op hematoma on .  The patient's immediate postoperative recovery has been unremarkable. Since hospital discharge the patient reports .   Current Outpatient Medications: Cholecalciferol (VITAMIN D3) 125 MCG (5000 UT) CAPS, Take 1 capsule (5,000 Units total) by mouth daily., Disp: 90 capsule, Rfl: 0 ibuprofen (ADVIL) 800 MG tablet, Take 1 tablet (800 mg total) by mouth every 6 (six) hours., Disp: 30 tablet, Rfl: 0 Prenatal Vit-Fe Fumarate-FA (PRENATAL MULTIVITAMIN) TABS tablet, Take 1 tablet by mouth daily at 12 noon., Disp: , Rfl:  Ferrous Sulfate (IRON) 325 (65 Fe) MG TABS, Take 1 tablet by mouth once daily with breakfast, Disp: 90 tablet, Rfl: 0 levothyroxine (SYNTHROID) 125 MCG tablet, Take 1 tablet (125 mcg total) by mouth daily before breakfast., Disp: 30 tablet, Rfl: 2 Norethindrone Acetate-Ethinyl Estrad-FE (LOESTRIN 24 FE) 1-20 MG-MCG(24) tablet, Take 1 tablet by mouth daily. (Patient not taking: Reported on 06/24/2019), Disp: 1 Package, Rfl: 11 Vitamin D, Ergocalciferol, (DRISDOL) 1.25 MG (50000 UNIT) CAPS capsule, Take 1 capsule (50,000 Units total) by mouth every 7 (seven) days., Disp: 12 capsule, Rfl: 0  No current facility-administered medications for this visit.    Blood pressure 105/68, pulse 78, height 4\' 6"  (1.372 m), weight 178 lb (80.7 kg), not currently breastfeeding.  Physical Exam: Minimal area of hematoma No evidence of infection Healing well  Diagnostic Tests:   Pathology:   Impression: Post c section wound hematoma  Plan:   Follow up: 3  days  Florian Buff, MD

## 2019-10-01 ENCOUNTER — Other Ambulatory Visit: Payer: Self-pay | Admitting: "Endocrinology

## 2019-10-02 LAB — CBC/DIFF AMBIGUOUS DEFAULT
Basophils Absolute: 0.1 x10E3/uL (ref 0.0–0.2)
Basos: 1 %
EOS (ABSOLUTE): 0.6 x10E3/uL — ABNORMAL HIGH (ref 0.0–0.4)
Eos: 6 %
Hematocrit: 32.8 % — ABNORMAL LOW (ref 34.0–46.6)
Hemoglobin: 10.9 g/dL — ABNORMAL LOW (ref 11.1–15.9)
Immature Grans (Abs): 0 x10E3/uL (ref 0.0–0.1)
Immature Granulocytes: 0 %
Lymphocytes Absolute: 2.2 x10E3/uL (ref 0.7–3.1)
Lymphs: 21 %
MCH: 28.8 pg (ref 26.6–33.0)
MCHC: 33.2 g/dL (ref 31.5–35.7)
MCV: 87 fL (ref 79–97)
Monocytes Absolute: 0.6 x10E3/uL (ref 0.1–0.9)
Monocytes: 5 %
Neutrophils Absolute: 7 x10E3/uL (ref 1.4–7.0)
Neutrophils: 67 %
Platelets: 304 x10E3/uL (ref 150–450)
RBC: 3.79 x10E6/uL (ref 3.77–5.28)
RDW: 13.8 % (ref 11.7–15.4)
WBC: 10.5 x10E3/uL (ref 3.4–10.8)

## 2019-10-02 LAB — TSH: TSH: 2.04 u[IU]/mL (ref 0.450–4.500)

## 2019-10-02 LAB — VITAMIN D 25 HYDROXY (VIT D DEFICIENCY, FRACTURES): Vit D, 25-Hydroxy: 17.8 ng/mL — ABNORMAL LOW (ref 30.0–100.0)

## 2019-10-02 LAB — T4, FREE: Free T4: 1.25 ng/dL (ref 0.82–1.77)

## 2019-10-03 ENCOUNTER — Other Ambulatory Visit: Payer: Self-pay | Admitting: "Endocrinology

## 2019-10-07 ENCOUNTER — Other Ambulatory Visit: Payer: Self-pay

## 2019-10-07 ENCOUNTER — Ambulatory Visit (INDEPENDENT_AMBULATORY_CARE_PROVIDER_SITE_OTHER): Payer: Medicaid Other | Admitting: "Endocrinology

## 2019-10-07 ENCOUNTER — Encounter: Payer: Self-pay | Admitting: "Endocrinology

## 2019-10-07 VITALS — BP 97/66 | HR 60 | Ht <= 58 in | Wt 179.0 lb

## 2019-10-07 DIAGNOSIS — E063 Autoimmune thyroiditis: Secondary | ICD-10-CM

## 2019-10-07 DIAGNOSIS — E038 Other specified hypothyroidism: Secondary | ICD-10-CM

## 2019-10-07 DIAGNOSIS — E559 Vitamin D deficiency, unspecified: Secondary | ICD-10-CM | POA: Diagnosis not present

## 2019-10-07 DIAGNOSIS — D500 Iron deficiency anemia secondary to blood loss (chronic): Secondary | ICD-10-CM | POA: Diagnosis not present

## 2019-10-07 MED ORDER — VITAMIN D (ERGOCALCIFEROL) 1.25 MG (50000 UNIT) PO CAPS: 50000.0000 [IU] | ORAL_CAPSULE | ORAL | 0 refills | Status: DC

## 2019-10-07 MED ORDER — VITAMIN D3 125 MCG (5000 UT) PO CAPS: 5000.0000 [IU] | ORAL_CAPSULE | Freq: Every day | ORAL | 0 refills | Status: DC

## 2019-10-07 NOTE — Progress Notes (Signed)
10/07/2019          Endocrinology follow-up note   Subjective:    Patient ID: Hailey Thompson, female    DOB: [birth year], PCP Muse, Noel Journey., PA-C   Past Medical History:  Diagnosis Date   Hypothyroidism    Migraine    Thyroid disease    UTI (lower urinary tract infection)    Past Surgical History:  Procedure Laterality Date   APPENDECTOMY     CESAREAN SECTION     CESAREAN SECTION N/A 05/14/2019   Procedure: CESAREAN SECTION;  Surgeon: Jonnie Kind, MD;  Location: [facility];  Service: Obstetrics;  Laterality: N/A;   CHOLECYSTECTOMY N/A 11/06/2015   Procedure: LAPAROSCOPIC CHOLECYSTECTOMY;  Surgeon: Vickie Epley, MD;  Location: [facility];  Service: General;  Laterality: N/A;   Social History   Socioeconomic History   Marital status: Married    Spouse name: dustin   Number of children: 1   Years of education: 12   Highest education level: 10th grade  Occupational History   Occupation: homemaker  Tobacco Use   Smoking status: Never Smoker   Smokeless tobacco: Never Used  Scientific laboratory technician Use: Never used  Substance and Sexual Activity   Alcohol use: No   Drug use: No   Sexual activity: Not Currently    Birth control/protection: None  Other Topics Concern   Not on file  Social History Narrative   Not on file   Social Determinants of Health   Financial Resource Strain: Low Risk    Difficulty of Paying Living Expenses: Not very hard  Food Insecurity: Unknown   Worried About Charity fundraiser in the Last Year: Never true   Ran Out of Food in the Last Year: Not on file  Transportation Needs: No Transportation Needs   Lack of Transportation (Medical): No   Lack of Transportation (Non-Medical): No  Physical Activity: Unknown   Days of Exercise per Week: 3 days   Minutes of Exercise per Session: Not on file  Stress: No Stress Concern Present   Feeling of Stress : Only a little  Social Connections: Moderately Integrated    Frequency of Communication with Friends and Family: More than three times a week   Frequency of Social Gatherings with Friends and Family: Twice a week   Attends Religious Services: More than 4 times per year   Active Member of [employer] or Organizations: No   Attends Archivist Meetings: Never   Marital Status: Married   Outpatient Encounter Medications as of 10/07/2019  Medication Sig   Cholecalciferol (VITAMIN D3) 125 MCG (5000 UT) CAPS Take 1 capsule (5,000 Units total) by mouth daily.   EUTHYROX 125 MCG tablet TAKE 1 TABLET BY MOUTH ONCE DAILY BEFORE BREAKFAST   Ferrous Sulfate (IRON) 325 (65 Fe) MG TABS Take 1 tablet by mouth once daily with breakfast   ibuprofen (ADVIL) 800 MG tablet Take 1 tablet (800 mg total) by mouth every 6 (six) hours.   Norethindrone Acetate-Ethinyl Estrad-FE (LOESTRIN 24 FE) 1-20 MG-MCG(24) tablet Take 1 tablet by mouth daily. (Patient not taking: Reported on 06/24/2019)   Vitamin D, Ergocalciferol, (DRISDOL) 1.25 MG (50000 UNIT) CAPS capsule Take 1 capsule (50,000 Units total) by mouth every 7 (seven) days.   [DISCONTINUED] Cholecalciferol (VITAMIN D3) 125 MCG (5000 UT) CAPS Take 1 capsule (5,000 Units total) by mouth daily.   [DISCONTINUED] famotidine (PEPCID) 20 MG tablet Take 1 tablet (20 mg total) by mouth 2 (two) times  daily.   [DISCONTINUED] Prenatal Vit-Fe Fumarate-FA (PRENATAL MULTIVITAMIN) TABS tablet Take 1 tablet by mouth daily at 12 noon.   [DISCONTINUED] Vitamin D, Ergocalciferol, (DRISDOL) 1.25 MG (50000 UNIT) CAPS capsule Take 1 capsule (50,000 Units total) by mouth every 7 (seven) days.   No facility-administered encounter medications on file as of 10/07/2019.   ALLERGIES: Allergies  Allergen Reactions   Mushroom Extract Complex Anaphylaxis   Chocolate Itching   Oxycodone Diarrhea   Prednisone Other (See Comments)    hallucinations   Versed [Midazolam] Hives    Hives after IV administration of zofran and versed.  Unsure which medication caused reaction   Zofran [Ondansetron Hcl] Hives    Hives after IV adminstration of zofran and versed. Unsure of which medication caused reaction.     VACCINATION STATUS: Immunization History  Administered Date(s) Administered   Influenza Whole 02/14/2011   Influenza,inj,Quad PF,6+ Mos 03/18/2019   Tdap 03/18/2019    HPI 24 year old female patient with medical history as above.  She is being seen in follow-up for hypothyroidism due to Hashimoto's thyroiditis, vitamin D deficiency.  She is also on treatment with iron supplement for iron deficiency anemia.  She is currently on levothyroxine 125 mcg p.o. daily before breakfast, with reasonable consistency and compliance.   She has no new complaints today. - She reports that she was diagnosed with hypothyroidism at age 24 years, treated with various doses of levothyroxine over the years. -In the past, there has been documented inconsistencies in her intake of his thyroid hormone .    - She has family history of hypothyroidism in her mother. She denies family history of thyroid cancer. She denies any personal history of goiter.   Review of Systems Limited as above.    Objective:    BP 97/66    Pulse 60    Ht 4\' 6"  (1.372 m)    Wt 179 lb (81.2 kg)    BMI 43.16 kg/m   Wt Readings from Last 3 Encounters:  10/07/19 179 lb (81.2 kg)  07/08/19 177 lb 6.4 oz (80.5 kg)  06/24/19 177 lb (80.3 kg)    Physical Exam- Limited  Constitutional:  Body mass index is 43.16 kg/m. , not in acute distress, normal state of mind Eyes:  EOMI, no exophthalmos Neck: Supple Thyroid: No gross goiter Respiratory: Adequate breathing efforts Musculoskeletal: no gross deformities, strength intact in all four extremities, no gross restriction of joint movements Skin:  no rashes, no hyperemia Neurological: no tremor with outstretched hands,    CMP     Component Value Date/Time   NA 136 05/12/2019 0850   K 3.9 05/12/2019 0850    CL 107 05/12/2019 0850   CO2 20 (L) 05/12/2019 0850   GLUCOSE 86 05/12/2019 0850   BUN 10 05/12/2019 0850   CREATININE 0.82 05/14/2019 1222   CREATININE 0.56 07/23/2013 0815   CALCIUM 8.4 (L) 05/12/2019 0850   CALCIUM 8.9 07/19/2011 1547   PROT 6.5 05/12/2019 0850   ALBUMIN 1.9 (L) 05/12/2019 0850   AST 26 05/12/2019 0850   ALT 22 05/12/2019 0850   ALKPHOS 138 (H) 05/12/2019 0850   BILITOT 0.5 05/12/2019 0850   GFRNONAA >60 05/14/2019 1222   GFRAA >60 05/14/2019 1222     Diabetic Labs (most recent): Lab Results  Component Value Date   HGBA1C 5.0 02/04/2017   HGBA1C 5.1 07/16/2012   HGBA1C 5.4 10/25/2011     Lipid Panel ( most recent) Lipid Panel     Component Value Date/Time  CHOL 156 07/23/2013 0815   TRIG 131 07/23/2013 0815   HDL 42 07/23/2013 0815   CHOLHDL 3.7 07/23/2013 0815   VLDL 26 07/23/2013 0815   LDLCALC 88 07/23/2013 0815   Recent Results (from the past 2160 hour(s))  CBC/Diff Ambiguous Default     Status: Abnormal   Collection Time: 10/01/19  1:33 PM  Result Value Ref Range   WBC 10.5 3.4 - 10.8 x10E3/uL   RBC 3.79 3.77 - 5.28 x10E6/uL   Hemoglobin 10.9 (L) 11.1 - 15.9 g/dL   Hematocrit 32.8 (L) 34.0 - 46.6 %   MCV 87 79 - 97 fL   MCH 28.8 26.6 - 33.0 pg   MCHC 33.2 31 - 35 g/dL   RDW 13.8 11.7 - 15.4 %   Platelets 304 150 - 450 x10E3/uL   Neutrophils 67 Not Estab. %   Lymphs 21 Not Estab. %   Monocytes 5 Not Estab. %   Eos 6 Not Estab. %   Basos 1 Not Estab. %   Neutrophils Absolute 7.0 1 - 7 x10E3/uL   Lymphocytes Absolute 2.2 0 - 3 x10E3/uL   Monocytes Absolute 0.6 0 - 0 x10E3/uL   EOS (ABSOLUTE) 0.6 (H) 0.0 - 0.4 x10E3/uL   Basophils Absolute 0.1 0 - 0 x10E3/uL   Immature Granulocytes 0 Not Estab. %   Immature Grans (Abs) 0.0 0.0 - 0.1 x10E3/uL    Comment: A hand-written panel/profile was received from your office. In accordance with the [facility] Ambiguous Test Code Policy dated July 1751, we have assigned CBC with  Differential/Platelet, Test Code #005009 to this request. If this is not the testing you wished to receive on this specimen, please contact the [facility] Department to clarify the test order. We appreciate your business.   T4, free     Status: None   Collection Time: 10/01/19  1:33 PM  Result Value Ref Range   Free T4 1.25 0.82 - 1.77 ng/dL  TSH     Status: None   Collection Time: 10/01/19  1:33 PM  Result Value Ref Range   TSH 2.040 0.450 - 4.500 uIU/mL  VITAMIN D 25 Hydroxy (Vit-D Deficiency, Fractures)     Status: Abnormal   Collection Time: 10/01/19  1:33 PM  Result Value Ref Range   Vit D, 25-Hydroxy 17.8 (L) 30.0 - 100.0 ng/mL    Comment: Vitamin D deficiency has been defined by the [place] and an Endocrine Society practice guideline as a level of serum 25-OH vitamin D less than 20 ng/mL (1,2). The Endocrine Society went on to further define vitamin D insufficiency as a level between 21 and 29 ng/mL (2). 1. IOM (Institute of Medicine). 2010. Dietary reference    intakes for calcium and D. [place]: The    [employer]. 2. Holick MF, Binkley Willis, Bischoff-Ferrari HA, et al.    Evaluation, treatment, and prevention of vitamin D    deficiency: an Endocrine Society clinical practice    guideline. JCEM. 2011 Jul; 96(7):1911-30.      Assessment & Plan:   1. Hypothyroidism due to Hashimoto's thyroiditis 2.  anemia-chronic blood loss 3.  Vitamin D deficiency -Her previsit thyroid function tests are consistent with appropriate replacement.  She is advised to continue  levothyroxine to 125 mcg p.o. daily before breakfast.     - We discussed about the correct intake of her thyroid hormone, on empty stomach at fasting, with water, separated by at least 30 minutes  from breakfast and other medications,  and separated by more than 4 hours from calcium, iron, multivitamins, acid reflux medications (PPIs). -Patient is made  aware of the fact that thyroid hormone replacement is needed for life, dose to be adjusted by periodic monitoring of thyroid function tests.   -Her recent labs show hemoglobin of 10..3, improving from 8.2.  She will continue to benefit from low-dose iron supplement,  ferrous sulfate 325 mg p.o. once a day.  -Her vitamin D remains low, he is advised to continue vitamin D2 50,000 units weekly for 12 weeks, and maintain afterwards with vitamin D3 5000 units daily.   -She does not have diabetes/prediabetes, her recent A1c was 5%.   - she  admits there is a room for improvement in her diet and drink choices. -  Suggestion is made for her to avoid simple carbohydrates  from her diet including Cakes, Sweet Desserts / Pastries, Ice Cream, Soda (diet and regular), Sweet Tea, Candies, Chips, Cookies, Sweet Pastries,  Store Bought Juices, Alcohol in Excess of  1-2 drinks a day, Artificial Sweeteners, Coffee Creamer, and "Sugar-free" Products. This will help patient to have stable blood glucose profile and potentially avoid unintended weight gain.   She is advised to continue close follow-up with her PCP [facility], PA-C.     - Time spent on this patient care encounter:  20 minutes of which 50% was spent in  counseling and the rest reviewing  her current and  previous labs / studies and medications  doses and developing a plan for long term care. Hailey Thompson  participated in the discussions, expressed understanding, and voiced agreement with the above plans.  All questions were answered to her satisfaction. she is encouraged to contact clinic should she have any questions or concerns prior to her return visit.    Follow up plan: Return in about 6 months (around 04/08/2020) for F/U with Pre-visit Labs.  Glade Lloyd, MD Phone: [phone]  Fax: [phone]  -  This note was partially dictated with voice recognition software. Similar sounding words can be transcribed inadequately or may not  be  corrected upon review.  10/07/2019, 1:50 PM

## 2019-12-27 ENCOUNTER — Other Ambulatory Visit: Payer: Self-pay

## 2019-12-27 ENCOUNTER — Telehealth: Payer: Self-pay | Admitting: "Endocrinology

## 2019-12-27 DIAGNOSIS — E559 Vitamin D deficiency, unspecified: Secondary | ICD-10-CM

## 2019-12-27 DIAGNOSIS — D500 Iron deficiency anemia secondary to blood loss (chronic): Secondary | ICD-10-CM

## 2019-12-27 DIAGNOSIS — E063 Autoimmune thyroiditis: Secondary | ICD-10-CM

## 2019-12-27 MED ORDER — VITAMIN D3 125 MCG (5000 UT) PO CAPS: 5000.0000 [IU] | ORAL_CAPSULE | Freq: Every day | ORAL | 0 refills | Status: DC

## 2019-12-27 MED ORDER — IRON 325 (65 FE) MG PO TABS: ORAL_TABLET | ORAL | 0 refills | Status: DC

## 2019-12-27 MED ORDER — LEVOTHYROXINE SODIUM 125 MCG PO TABS: ORAL_TABLET | ORAL | 0 refills | Status: DC

## 2019-12-27 NOTE — Telephone Encounter (Signed)
Patient is calling and requesting a refill on her 4 medications and to a different pharmacy below..  Cholecalciferol (VITAMIN D3) 125 MCG (5000 UT) CAPS  EUTHYROX 125 MCG tablet   Ferrous Sulfate (IRON) 325 (65 Fe) MG TABS  Vitamin D, Ergocalciferol, (DRISDOL) 1.25 MG (50000 UNIT) CAPS capsule    [id number] [address], [place] - [place] [address] [place] Phone:  [phone]  Fax:  [phone]

## 2019-12-27 NOTE — Telephone Encounter (Signed)
Sent in scripts, Vit D once weekly was only for 12 weeks so that script was not renewed.

## 2020-03-23 ENCOUNTER — Other Ambulatory Visit: Payer: Self-pay | Admitting: "Endocrinology

## 2020-03-23 DIAGNOSIS — E038 Other specified hypothyroidism: Secondary | ICD-10-CM

## 2020-03-23 DIAGNOSIS — E559 Vitamin D deficiency, unspecified: Secondary | ICD-10-CM

## 2020-04-03 ENCOUNTER — Other Ambulatory Visit: Payer: Self-pay

## 2020-04-03 ENCOUNTER — Other Ambulatory Visit (HOSPITAL_COMMUNITY)
Admission: RE | Admit: 2020-04-03 | Discharge: 2020-04-03 | Disposition: A | Discharge status: Home / Self Care | Payer: Medicaid Other | Source: Ambulatory Visit | Attending: "Endocrinology | Admitting: "Endocrinology

## 2020-04-03 DIAGNOSIS — E063 Autoimmune thyroiditis: Secondary | ICD-10-CM | POA: Diagnosis present

## 2020-04-03 DIAGNOSIS — E559 Vitamin D deficiency, unspecified: Secondary | ICD-10-CM | POA: Insufficient documentation

## 2020-04-03 LAB — T4, FREE: Free T4: 1.47 ng/dL — ABNORMAL HIGH (ref 0.61–1.12)

## 2020-04-03 LAB — VITAMIN D 25 HYDROXY (VIT D DEFICIENCY, FRACTURES): Vit D, 25-Hydroxy: 35.34 ng/mL (ref 30–100)

## 2020-04-03 LAB — TSH: TSH: 0.061 u[IU]/mL — ABNORMAL LOW (ref 0.350–4.500)

## 2020-04-13 ENCOUNTER — Ambulatory Visit: Payer: Medicaid Other | Admitting: "Endocrinology

## 2020-04-17 ENCOUNTER — Other Ambulatory Visit: Payer: Self-pay

## 2020-04-17 ENCOUNTER — Encounter: Payer: Self-pay | Admitting: "Endocrinology

## 2020-04-17 ENCOUNTER — Ambulatory Visit (INDEPENDENT_AMBULATORY_CARE_PROVIDER_SITE_OTHER): Payer: Medicaid Other | Admitting: "Endocrinology

## 2020-04-17 DIAGNOSIS — E038 Other specified hypothyroidism: Secondary | ICD-10-CM | POA: Diagnosis not present

## 2020-04-17 DIAGNOSIS — E063 Autoimmune thyroiditis: Secondary | ICD-10-CM | POA: Diagnosis not present

## 2020-04-17 MED ORDER — LEVOTHYROXINE SODIUM 112 MCG PO TABS: 112.0000 ug | ORAL_TABLET | Freq: Every day | ORAL | 1 refills | Status: DC

## 2020-04-17 NOTE — Progress Notes (Signed)
04/17/2020         Endocrinology follow-up note  Subjective:    Patient ID: Hailey Thompson, female    DOB: 15-Feb-1996, PCP Muse, Noel Journey., PA-C   Past Medical History:  Diagnosis Date  . Hypothyroidism   . Migraine   . Thyroid disease   . UTI (lower urinary tract infection)    Past Surgical History:  Procedure Laterality Date  . APPENDECTOMY    . CESAREAN SECTION    . CESAREAN SECTION N/A 05/14/2019   Procedure: CESAREAN SECTION;  Surgeon: Jonnie Kind, MD;  Location: [facility];  Service: Obstetrics;  Laterality: N/A;  . CHOLECYSTECTOMY N/A 11/06/2015   Procedure: LAPAROSCOPIC CHOLECYSTECTOMY;  Surgeon: Vickie Epley, MD;  Location: [facility];  Service: General;  Laterality: N/A;   Social History   Socioeconomic History  . Marital status: Married    Spouse name: dustin  . Number of children: 1  . Years of education: 19  . Highest education level: 10th grade  Occupational History  . Occupation: homemaker  Tobacco Use  . Smoking status: Never Smoker  . Smokeless tobacco: Never Used  Vaping Use  . Vaping Use: Never used  Substance and Sexual Activity  . Alcohol use: No  . Drug use: No  . Sexual activity: Not Currently    Birth control/protection: None  Other Topics Concern  . Not on file  Social History Narrative  . Not on file   Social Determinants of Health   Financial Resource Strain: Not on file  Food Insecurity: Not on file  Transportation Needs: Not on file  Physical Activity: Not on file  Stress: Not on file  Social Connections: Not on file   Outpatient Encounter Medications as of 04/17/2020  Medication Sig  . [facility] D3 125 MCG (5000 UT) capsule TAKE 1 CAPSULE (5,000 UNITS TOTAL) BY MOUTH DAILY.  Marland Kitchen Ferrous Sulfate (IRON) 325 (65 Fe) MG TABS Take 1 tablet by mouth once daily with breakfast  . ibuprofen (ADVIL) 800 MG tablet Take 1 tablet (800 mg total) by mouth every 6 (six) hours.  . [DISCONTINUED] levothyroxine (SYNTHROID) 125 MCG tablet TAKE 1  TABLET BY MOUTH ONCE DAILY BEFORE BREAKFAST  . levothyroxine (SYNTHROID) 112 MCG tablet Take 1 tablet (112 mcg total) by mouth daily before breakfast.  . [DISCONTINUED] famotidine (PEPCID) 20 MG tablet Take 1 tablet (20 mg total) by mouth 2 (two) times daily.  . [DISCONTINUED] Norethindrone Acetate-Ethinyl Estrad-FE (LOESTRIN 24 FE) 1-20 MG-MCG(24) tablet Take 1 tablet by mouth daily. (Patient not taking: Reported on 06/24/2019)   No facility-administered encounter medications on file as of 04/17/2020.   ALLERGIES: Allergies  Allergen Reactions  . Mushroom Extract Complex Anaphylaxis  . Chocolate Itching  . Oxycodone Diarrhea  . Prednisone Other (See Comments)    hallucinations  . Versed [Midazolam] Hives    Hives after IV administration of zofran and versed. Unsure which medication caused reaction  . Zofran [Ondansetron Hcl] Hives    Hives after IV adminstration of zofran and versed. Unsure of which medication caused reaction.     VACCINATION STATUS: Immunization History  Administered Date(s) Administered  . Influenza Whole 02/14/2011  . Influenza,inj,Quad PF,6+ Mos 03/18/2019  . Tdap 03/18/2019    HPI 25 year old female patient with medical history as above.   She is being seen in follow-up for hypothyroidism due to Hashimoto's thyroiditis, vitamin D deficiency, iron deficiency anemia .  She is on levothyroxine 125 mcg p.o. daily before breakfast, recent thyroid function test  consistent with slight over replacement.  She is also on treatment with iron supplement for iron deficiency anemia.  She has no major new complaints today. - She reports that she was diagnosed with hypothyroidism at age 26 years, treated with various doses of levothyroxine over the years. -In the past, there has been documented inconsistencies in her intake of his thyroid hormone .    - She has family history of hypothyroidism in her mother. She denies family history of thyroid cancer. She denies any personal  history of goiter.   Review of Systems Limited as above.    Objective:    BP 112/71 (BP Location: Left Arm, Patient Position: Sitting)   Pulse 76   Ht '4\' 6"'$  (1.372 m)   Wt 181 lb 3.2 oz (82.2 kg)   BMI 43.69 kg/m   Wt Readings from Last 3 Encounters:  04/17/20 181 lb 3.2 oz (82.2 kg)  10/07/19 179 lb (81.2 kg)  07/08/19 177 lb 6.4 oz (80.5 kg)    Physical Exam- Limited   CMP     Component Value Date/Time   NA 136 05/12/2019 0850   K 3.9 05/12/2019 0850   CL 107 05/12/2019 0850   CO2 20 (L) 05/12/2019 0850   GLUCOSE 86 05/12/2019 0850   BUN 10 05/12/2019 0850   CREATININE 0.82 05/14/2019 1222   CREATININE 0.56 07/23/2013 0815   CALCIUM 8.4 (L) 05/12/2019 0850   CALCIUM 8.9 07/19/2011 1547   PROT 6.5 05/12/2019 0850   ALBUMIN 1.9 (L) 05/12/2019 0850   AST 26 05/12/2019 0850   ALT 22 05/12/2019 0850   ALKPHOS 138 (H) 05/12/2019 0850   BILITOT 0.5 05/12/2019 0850   GFRNONAA >60 05/14/2019 1222   GFRAA >60 05/14/2019 1222     Diabetic Labs (most recent): Lab Results  Component Value Date   HGBA1C 5.0 02/04/2017   HGBA1C 5.1 07/16/2012   HGBA1C 5.4 10/25/2011     Lipid Panel ( most recent) Lipid Panel     Component Value Date/Time   CHOL 156 07/23/2013 0815   TRIG 131 07/23/2013 0815   HDL 42 07/23/2013 0815   CHOLHDL 3.7 07/23/2013 0815   VLDL 26 07/23/2013 0815   LDLCALC 88 07/23/2013 0815   Recent Results (from the past 2160 hour(s))  TSH     Status: Abnormal   Collection Time: 04/03/20 11:27 AM  Result Value Ref Range   TSH 0.061 (L) 0.350 - 4.500 uIU/mL    Comment: Performed by a 3rd Generation assay with a functional sensitivity of <=0.01 uIU/mL. Performed at [facility], [address], [place], [place] [address]   T4, free     Status: Abnormal   Collection Time: 04/03/20 11:27 AM  Result Value Ref Range   Free T4 1.47 (H) 0.61 - 1.12 ng/dL    Comment: (NOTE) Biotin ingestion may interfere with free T4 tests. If the results  are inconsistent with the TSH level, previous test results, or the clinical presentation, then consider biotin interference. If needed, order repeat testing after stopping biotin. Performed at [facility], [place] [address] [place], [place] [address]   VITAMIN D 25 Hydroxy (Vit-D Deficiency, Fractures)     Status: None   Collection Time: 04/03/20 11:27 AM  Result Value Ref Range   Vit D, 25-Hydroxy 35.34 30 - 100 ng/mL    Comment: (NOTE) Vitamin D deficiency has been defined by the [place] practice guideline as a level of serum  25-OH  vitamin D less than 20 ng/mL (1,2). The Endocrine Society went on to  further define vitamin D insufficiency as a level between 21 and 29  ng/mL (2).  1. IOM (Institute of Medicine). 2010. Dietary reference intakes for  calcium and D. [place]: The [employer]. 2. Holick MF, Binkley Melvin, Bischoff-Ferrari HA, et al. Evaluation,  treatment, and prevention of vitamin D deficiency: an Endocrine  Society clinical practice guideline, JCEM. 2011 Jul; 96(7): 1911-30.  Performed at [facility], [place] [address], [place], [place] [address]      Assessment & Plan:   1. Hypothyroidism due to Hashimoto's thyroiditis 2.  anemia-chronic blood loss 3.  Vitamin D deficiency -Her previsit thyroid function tests are consistent with slight over replacement.  I discussed and lowered her levothyroxine to 112 mcg p.o. daily before breakfast.     - We discussed about the correct intake of her thyroid hormone, on empty stomach at fasting, with water, separated by at least 30 minutes from breakfast and other medications,  and separated by more than 4 hours from calcium, iron, multivitamins, acid reflux medications (PPIs). -Patient is made aware of the fact that thyroid hormone replacement is needed for life, dose to be adjusted by periodic monitoring of thyroid function tests.    -Her last CBC showed  hemoglobin of 10.9, benefiting from iron supplement.  She is advised to continue  ferrous sulfate 325 mg p.o. once a day.  -Her vitamin D has corrected at thirty-five, improving from seventeen.  She is advised to maintain with vitamin D3 5000 units daily.     -She does not have diabetes/prediabetes, her recent A1c was 5%.   - she  admits there is a room for improvement in her diet and drink choices. -  Suggestion is made for her to avoid simple carbohydrates  from her diet including Cakes, Sweet Desserts / Pastries, Ice Cream, Soda (diet and regular), Sweet Tea, Candies, Chips, Cookies, Sweet Pastries,  Store Bought Juices, Alcohol in Excess of  1-2 drinks a day, Artificial Sweeteners, Coffee Creamer, and "Sugar-free" Products. This will help patient to have stable blood glucose profile and potentially avoid unintended weight gain.   She is advised to continue close follow-up with her PCP [facility], PA-C.      - Time spent on this patient care encounter:  20 minutes of which 50% was spent in  counseling and the rest reviewing  her current and  previous labs / studies and medications  doses and developing a plan for long term care. Hailey Thompson  participated in the discussions, expressed understanding, and voiced agreement with the above plans.  All questions were answered to her satisfaction. she is encouraged to contact clinic should she have any questions or concerns prior to her return visit.     Follow up plan: Return in about 6 months (around 10/15/2020) for F/U with Pre-visit Labs.  Glade Lloyd, MD Phone: [phone]  Fax: [phone]  -  This note was partially dictated with voice recognition software. Similar sounding words can be transcribed inadequately or may not  be corrected upon review.  04/17/2020, 11:54 AM

## 2020-05-19 ENCOUNTER — Other Ambulatory Visit: Payer: Self-pay | Admitting: "Endocrinology

## 2020-05-19 DIAGNOSIS — D500 Iron deficiency anemia secondary to blood loss (chronic): Secondary | ICD-10-CM

## 2020-06-12 ENCOUNTER — Other Ambulatory Visit (HOSPITAL_COMMUNITY)
Admission: RE | Admit: 2020-06-12 | Discharge: 2020-06-12 | Disposition: A | Discharge status: Home / Self Care | Payer: Medicaid Other | Source: Ambulatory Visit | Attending: "Endocrinology | Admitting: "Endocrinology

## 2020-06-12 ENCOUNTER — Other Ambulatory Visit: Payer: Self-pay

## 2020-06-12 DIAGNOSIS — E063 Autoimmune thyroiditis: Secondary | ICD-10-CM | POA: Insufficient documentation

## 2020-06-12 DIAGNOSIS — E038 Other specified hypothyroidism: Secondary | ICD-10-CM | POA: Insufficient documentation

## 2020-06-12 LAB — CBC WITH DIFFERENTIAL/PLATELET
Abs Immature Granulocytes: 0.04 10*3/uL (ref 0.00–0.07)
Basophils Absolute: 0.1 10*3/uL (ref 0.0–0.1)
Basophils Relative: 0 %
Eosinophils Absolute: 0.4 10*3/uL (ref 0.0–0.5)
Eosinophils Relative: 3 %
HCT: 35.3 % — ABNORMAL LOW (ref 36.0–46.0)
Hemoglobin: 10.9 g/dL — ABNORMAL LOW (ref 12.0–15.0)
Immature Granulocytes: 0 %
Lymphocytes Relative: 21 %
Lymphs Abs: 2.5 10*3/uL (ref 0.7–4.0)
MCH: 27.9 pg (ref 26.0–34.0)
MCHC: 30.9 g/dL (ref 30.0–36.0)
MCV: 90.5 fL (ref 80.0–100.0)
Monocytes Absolute: 0.5 10*3/uL (ref 0.1–1.0)
Monocytes Relative: 4 %
Neutro Abs: 8.8 10*3/uL — ABNORMAL HIGH (ref 1.7–7.7)
Neutrophils Relative %: 72 %
Platelets: 340 10*3/uL (ref 150–400)
RBC: 3.9 MIL/uL (ref 3.87–5.11)
RDW: 14 % (ref 11.5–15.5)
WBC: 12.3 10*3/uL — ABNORMAL HIGH (ref 4.0–10.5)
nRBC: 0 % (ref 0.0–0.2)

## 2020-06-12 LAB — T4, FREE: Free T4: 1.38 ng/dL — ABNORMAL HIGH (ref 0.61–1.12)

## 2020-06-12 LAB — TSH: TSH: 0.227 u[IU]/mL — ABNORMAL LOW (ref 0.350–4.500)

## 2020-06-15 ENCOUNTER — Other Ambulatory Visit: Payer: Self-pay | Admitting: "Endocrinology

## 2020-06-15 ENCOUNTER — Telehealth (INDEPENDENT_AMBULATORY_CARE_PROVIDER_SITE_OTHER): Payer: Self-pay | Admitting: "Endocrinology

## 2020-06-15 ENCOUNTER — Other Ambulatory Visit: Payer: Self-pay

## 2020-06-15 ENCOUNTER — Encounter: Payer: Self-pay | Admitting: "Endocrinology

## 2020-06-15 VITALS — Ht <= 58 in | Wt 183.0 lb

## 2020-06-15 DIAGNOSIS — E063 Autoimmune thyroiditis: Secondary | ICD-10-CM

## 2020-06-15 DIAGNOSIS — E559 Vitamin D deficiency, unspecified: Secondary | ICD-10-CM

## 2020-06-15 DIAGNOSIS — E038 Other specified hypothyroidism: Secondary | ICD-10-CM

## 2020-06-15 MED ORDER — LEVOTHYROXINE SODIUM 100 MCG PO TABS: 100.0000 ug | ORAL_TABLET | Freq: Every day | ORAL | 1 refills | Status: DC

## 2020-06-15 NOTE — Progress Notes (Signed)
06/15/2020                                        Endocrinology Telehealth Visit Follow up Note -During COVID -19 Pandemic  This visit type was conducted  via telephone due to national recommendations for restrictions regarding the COVID-19 Pandemic  in an effort to limit this patient's exposure and mitigate transmission of the corona virus.   I connected with Hailey Thompson on 06/15/2020   by telephone and verified that I am speaking with the correct person using two identifiers. Hailey Thompson, 26-Nov-1995. she has verbally consented to this visit.  I, Glade Lloyd , MD was in my office and patient , Hailey Thompson , was in her residence. No other persons were with me during the encounter. All issues noted in this document were discussed and addressed. The format was not optimal for physical exam.    Subjective:    Patient ID: Hailey Thompson, female    DOB: [birth year], PCP Muse, Noel Journey., PA-C   Past Medical History:  Diagnosis Date  . Hypothyroidism   . Migraine   . Thyroid disease   . UTI (lower urinary tract infection)    Past Surgical History:  Procedure Laterality Date  . APPENDECTOMY    . CESAREAN SECTION    . CESAREAN SECTION N/A 05/14/2019   Procedure: CESAREAN SECTION;  Surgeon: Jonnie Kind, MD;  Location: [facility];  Service: Obstetrics;  Laterality: N/A;  . CHOLECYSTECTOMY N/A 11/06/2015   Procedure: LAPAROSCOPIC CHOLECYSTECTOMY;  Surgeon: Vickie Epley, MD;  Location: [facility];  Service: General;  Laterality: N/A;   Social History   Socioeconomic History  . Marital status: Married    Spouse name: dustin  . Number of children: 1  . Years of education: 71  . Highest education level: 10th grade  Occupational History  . Occupation: homemaker  Tobacco Use  . Smoking status: Never Smoker  . Smokeless tobacco: Never Used  Vaping Use  . Vaping Use: Never used  Substance and Sexual Activity  . Alcohol use: No  . Drug use: No  . Sexual activity: Not Currently    Birth  control/protection: None  Other Topics Concern  . Not on file  Social History Narrative  . Not on file   Social Determinants of Health   Financial Resource Strain: Not on file  Food Insecurity: Not on file  Transportation Needs: Not on file  Physical Activity: Not on file  Stress: Not on file  Social Connections: Not on file   Outpatient Encounter Medications as of 06/15/2020  Medication Sig  . CVS D3 125 MCG (5000 UT) capsule TAKE 1 CAPSULE (5,000 UNITS TOTAL) BY MOUTH DAILY.  . ferrous sulfate 325 (65 FE) MG tablet TAKE 1 TABLET BY MOUTH ONCE DAILY WITH BREAKFAST  . ibuprofen (ADVIL) 800 MG tablet Take 1 tablet (800 mg total) by mouth every 6 (six) hours.  Marland Kitchen levothyroxine (SYNTHROID) 100 MCG tablet Take 1 tablet (100 mcg total) by mouth daily before breakfast.  . [DISCONTINUED] famotidine (PEPCID) 20 MG tablet Take 1 tablet (20 mg total) by mouth 2 (two) times daily.  . [DISCONTINUED] levothyroxine (SYNTHROID) 112 MCG tablet Take 1 tablet (112 mcg total) by mouth daily before breakfast.   No facility-administered encounter medications on file as of 06/15/2020.   ALLERGIES: Allergies  Allergen Reactions  . Mushroom Extract Complex Anaphylaxis  .  Chocolate Itching  . Oxycodone Diarrhea  . Prednisone Other (See Comments)    hallucinations  . Versed [Midazolam] Hives    Hives after IV administration of zofran and versed. Unsure which medication caused reaction  . Zofran [Ondansetron Hcl] Hives    Hives after IV adminstration of zofran and versed. Unsure of which medication caused reaction.     VACCINATION STATUS: Immunization History  Administered Date(s) Administered  . Influenza Whole 02/14/2011  . Influenza,inj,Quad PF,6+ Mos 03/18/2019  . Tdap 03/18/2019    HPI 25 year old female patient with medical history as above.  She is being seen in follow-up for hypothyroidism due to Hashimoto's thyroiditis, vitamin D deficiency, iron deficiency anemia .  She called for  palpitations prior to her scheduled appointment.  Her labs are showing slight over replacement.  She reports anxiety, palpitations.   She is also on treatment with iron supplement for iron deficiency anemia.   - She reports that she was diagnosed with hypothyroidism at age 81 years, treated with various doses of levothyroxine over the years. -In the past, there has been documented inconsistencies in her intake of his thyroid hormone .    - She has family history of hypothyroidism in her mother. She denies family history of thyroid cancer. She denies any personal history of goiter.   Review of Systems Limited as above.    Objective:    Ht '4\' 6"'$  (1.372 m)   Wt 183 lb (83 kg)   BMI 44.12 kg/m   Wt Readings from Last 3 Encounters:  06/15/20 183 lb (83 kg)  04/17/20 181 lb 3.2 oz (82.2 kg)  10/07/19 179 lb (81.2 kg)    Physical Exam- Limited   CMP     Component Value Date/Time   NA 136 05/12/2019 0850   K 3.9 05/12/2019 0850   CL 107 05/12/2019 0850   CO2 20 (L) 05/12/2019 0850   GLUCOSE 86 05/12/2019 0850   BUN 10 05/12/2019 0850   CREATININE 0.82 05/14/2019 1222   CREATININE 0.56 07/23/2013 0815   CALCIUM 8.4 (L) 05/12/2019 0850   CALCIUM 8.9 07/19/2011 1547   PROT 6.5 05/12/2019 0850   ALBUMIN 1.9 (L) 05/12/2019 0850   AST 26 05/12/2019 0850   ALT 22 05/12/2019 0850   ALKPHOS 138 (H) 05/12/2019 0850   BILITOT 0.5 05/12/2019 0850   GFRNONAA >60 05/14/2019 1222   GFRAA >60 05/14/2019 1222     Diabetic Labs (most recent): Lab Results  Component Value Date   HGBA1C 5.0 02/04/2017   HGBA1C 5.1 07/16/2012   HGBA1C 5.4 10/25/2011     Lipid Panel ( most recent) Lipid Panel     Component Value Date/Time   CHOL 156 07/23/2013 0815   TRIG 131 07/23/2013 0815   HDL 42 07/23/2013 0815   CHOLHDL 3.7 07/23/2013 0815   VLDL 26 07/23/2013 0815   LDLCALC 88 07/23/2013 0815   Recent Results (from the past 2160 hour(s))  TSH     Status: Abnormal   Collection Time:  04/03/20 11:27 AM  Result Value Ref Range   TSH 0.061 (L) 0.350 - 4.500 uIU/mL    Comment: Performed by a 3rd Generation assay with a functional sensitivity of <=0.01 uIU/mL. Performed at [facility], [address], [place], [place] [address]   T4, free     Status: Abnormal   Collection Time: 04/03/20 11:27 AM  Result Value Ref Range   Free T4 1.47 (H) 0.61 - 1.12 ng/dL    Comment: (NOTE) Biotin  ingestion may interfere with free T4 tests. If the results are inconsistent with the TSH level, previous test results, or the clinical presentation, then consider biotin interference. If needed, order repeat testing after stopping biotin. Performed at [facility], [place] [address], [place], [place] [address]   VITAMIN D 25 Hydroxy (Vit-D Deficiency, Fractures)     Status: None   Collection Time: 04/03/20 11:27 AM  Result Value Ref Range   Vit D, 25-Hydroxy 35.34 30 - 100 ng/mL    Comment: (NOTE) Vitamin D deficiency has been defined by the [place] practice guideline as a level of serum 25-OH  vitamin D less than 20 ng/mL (1,2). The Endocrine Society went on to  further define vitamin D insufficiency as a level between 21 and 29  ng/mL (2).  1. IOM (Institute of Medicine). 2010. Dietary reference intakes for  calcium and D. [place]: The [employer]. 2. Holick MF, Binkley Penelope, Bischoff-Ferrari HA, et al. Evaluation,  treatment, and prevention of vitamin D deficiency: an Endocrine  Society clinical practice guideline, JCEM. 2011 Jul; 96(7): 1911-30.  Performed at [facility], [place] [address] [place], [place] [address]   CBC with Differential/Platelet     Status: Abnormal   Collection Time: 06/12/20  2:45 PM  Result Value Ref Range   WBC 12.3 (H) 4.0 - 10.5 K/uL   RBC 3.90 3.87 - 5.11 MIL/uL   Hemoglobin 10.9 (L) 12.0 - 15.0 g/dL   HCT 35.3 (L) 36.0 - 46.0 %   MCV 90.5 80.0 - 100.0 fL   MCH 27.9 26.0 - 34.0 pg    MCHC 30.9 30.0 - 36.0 g/dL   RDW 14.0 11.5 - 15.5 %   Platelets 340 150 - 400 K/uL   nRBC 0.0 0.0 - 0.2 %   Neutrophils Relative % 72 %   Neutro Abs 8.8 (H) 1.7 - 7.7 K/uL   Lymphocytes Relative 21 %   Lymphs Abs 2.5 0.7 - 4.0 K/uL   Monocytes Relative 4 %   Monocytes Absolute 0.5 0.1 - 1.0 K/uL   Eosinophils Relative 3 %   Eosinophils Absolute 0.4 0.0 - 0.5 K/uL   Basophils Relative 0 %   Basophils Absolute 0.1 0.0 - 0.1 K/uL   Immature Granulocytes 0 %   Abs Immature Granulocytes 0.04 0.00 - 0.07 K/uL    Comment: Performed at [facility], [address], [place], [place] [address]  T4, free     Status: Abnormal   Collection Time: 06/12/20  2:46 PM  Result Value Ref Range   Free T4 1.38 (H) 0.61 - 1.12 ng/dL    Comment: (NOTE) Biotin ingestion may interfere with free T4 tests. If the results are inconsistent with the TSH level, previous test results, or the clinical presentation, then consider biotin interference. If needed, order repeat testing after stopping biotin. Performed at [facility], [place] [address], [place], [place] [address]   TSH     Status: Abnormal   Collection Time: 06/12/20  2:46 PM  Result Value Ref Range   TSH 0.227 (L) 0.350 - 4.500 uIU/mL    Comment: Performed by a 3rd Generation assay with a functional sensitivity of <=0.01 uIU/mL. Performed at [facility], [address], [place], [place] [address]      Assessment & Plan:   1. Hypothyroidism due to Hashimoto's thyroiditis 2.  anemia-chronic blood loss 3.  Vitamin D deficiency -Her previsit thyroid function  tests are consistent with slight over replacement.  She is approached for a lower dose of levothyroxine.  I discussed and lowered her levothyroxine to 100 mcg p.o. daily before breakfast.     - We discussed about the correct intake of her thyroid hormone, on empty stomach at fasting, with water, separated by at least 30 minutes from breakfast and other medications,  and separated by more  than 4 hours from calcium, iron, multivitamins, acid reflux medications (PPIs). -Patient is made aware of the fact that thyroid hormone replacement is needed for life, dose to be adjusted by periodic monitoring of thyroid function tests.   -Her last CBC showed hemoglobin of 10.9, benefiting from iron supplement.  She is advised to continue  ferrous sulfate 325 mg p.o. once a day.  -Her vitamin D has corrected at thirty-five, improving from seventeen.  She is advised to maintain with vitamin D3 5000 units daily.     -She does not have diabetes/prediabetes, her recent A1c was 5%.    - she acknowledges that there is a room for improvement in her food and drink choices. - Suggestion is made for her to avoid simple carbohydrates  from her diet including Cakes, Sweet Desserts, Ice Cream, Soda (diet and regular), Sweet Tea, Candies, Chips, Cookies, Store Bought Juices, Alcohol in Excess of  1-2 drinks a day, Artificial Sweeteners,  Coffee Creamer, and "Sugar-free" Products, Lemonade. This will help patient to have more stable blood glucose profile and potentially avoid unintended weight gain.   She is advised to continue close follow-up with her PCP [facility], PA-C.      - Time spent on this patient care encounter:  30 minutes of which 50% was spent in  counseling and the rest reviewing  her current and  previous labs / studies and medications  doses and developing a plan for long term care, and documenting this care. Hailey Thompson  participated in the discussions, expressed understanding, and voiced agreement with the above plans.  All questions were answered to her satisfaction. she is encouraged to contact clinic should she have any questions or concerns prior to her return visit.     Follow up plan: Return in about 8 weeks (around 08/10/2020) for F/U with Pre-visit Labs.  Glade Lloyd, MD Phone: [phone]  Fax: [phone]  -  This note was partially dictated with voice recognition  software. Similar sounding words can be transcribed inadequately or may not  be corrected upon review.  06/15/2020, 1:18 PM

## 2020-06-15 NOTE — Progress Notes (Signed)
Pt states she's experiencing heart palpitations off & on x 3-4 wks also experiencing increased fatigue and appetite.

## 2020-06-16 ENCOUNTER — Other Ambulatory Visit: Payer: Self-pay | Admitting: "Endocrinology

## 2020-06-16 DIAGNOSIS — E063 Autoimmune thyroiditis: Secondary | ICD-10-CM

## 2020-06-16 DIAGNOSIS — E038 Other specified hypothyroidism: Secondary | ICD-10-CM

## 2020-08-14 ENCOUNTER — Other Ambulatory Visit: Payer: Self-pay

## 2020-08-14 ENCOUNTER — Other Ambulatory Visit: Payer: Self-pay | Admitting: "Endocrinology

## 2020-08-14 ENCOUNTER — Ambulatory Visit: Payer: Medicaid Other | Admitting: "Endocrinology

## 2020-08-14 ENCOUNTER — Other Ambulatory Visit (HOSPITAL_COMMUNITY)
Admission: RE | Admit: 2020-08-14 | Discharge: 2020-08-14 | Disposition: A | Discharge status: Home / Self Care | Payer: Medicaid Other | Source: Ambulatory Visit | Attending: "Endocrinology | Admitting: "Endocrinology

## 2020-08-14 DIAGNOSIS — D500 Iron deficiency anemia secondary to blood loss (chronic): Secondary | ICD-10-CM

## 2020-08-14 DIAGNOSIS — E038 Other specified hypothyroidism: Secondary | ICD-10-CM | POA: Diagnosis not present

## 2020-08-14 DIAGNOSIS — E063 Autoimmune thyroiditis: Secondary | ICD-10-CM | POA: Diagnosis present

## 2020-08-14 LAB — CBC WITH DIFFERENTIAL/PLATELET
Abs Immature Granulocytes: 0.05 10*3/uL (ref 0.00–0.07)
Basophils Absolute: 0.1 10*3/uL (ref 0.0–0.1)
Basophils Relative: 1 %
Eosinophils Absolute: 0.3 10*3/uL (ref 0.0–0.5)
Eosinophils Relative: 3 %
HCT: 35.1 % — ABNORMAL LOW (ref 36.0–46.0)
Hemoglobin: 11.2 g/dL — ABNORMAL LOW (ref 12.0–15.0)
Immature Granulocytes: 0 %
Lymphocytes Relative: 19 %
Lymphs Abs: 2.3 10*3/uL (ref 0.7–4.0)
MCH: 29.2 pg (ref 26.0–34.0)
MCHC: 31.9 g/dL (ref 30.0–36.0)
MCV: 91.4 fL (ref 80.0–100.0)
Monocytes Absolute: 0.5 10*3/uL (ref 0.1–1.0)
Monocytes Relative: 4 %
Neutro Abs: 9 10*3/uL — ABNORMAL HIGH (ref 1.7–7.7)
Neutrophils Relative %: 73 %
Platelets: 320 10*3/uL (ref 150–400)
RBC: 3.84 MIL/uL — ABNORMAL LOW (ref 3.87–5.11)
RDW: 13.3 % (ref 11.5–15.5)
WBC: 12.2 10*3/uL — ABNORMAL HIGH (ref 4.0–10.5)
nRBC: 0 % (ref 0.0–0.2)

## 2020-08-14 LAB — T4, FREE: Free T4: 1.05 ng/dL (ref 0.61–1.12)

## 2020-08-14 LAB — TSH: TSH: 2.709 u[IU]/mL (ref 0.350–4.500)

## 2020-08-21 ENCOUNTER — Encounter: Payer: Self-pay | Admitting: "Endocrinology

## 2020-08-21 ENCOUNTER — Other Ambulatory Visit: Payer: Self-pay

## 2020-08-21 ENCOUNTER — Ambulatory Visit (INDEPENDENT_AMBULATORY_CARE_PROVIDER_SITE_OTHER): Payer: Medicaid Other | Admitting: "Endocrinology

## 2020-08-21 VITALS — BP 100/58 | HR 72 | Ht <= 58 in | Wt 188.8 lb

## 2020-08-21 DIAGNOSIS — E038 Other specified hypothyroidism: Secondary | ICD-10-CM

## 2020-08-21 DIAGNOSIS — E063 Autoimmune thyroiditis: Secondary | ICD-10-CM | POA: Diagnosis not present

## 2020-08-21 DIAGNOSIS — E559 Vitamin D deficiency, unspecified: Secondary | ICD-10-CM

## 2020-08-21 MED ORDER — LEVOTHYROXINE SODIUM 100 MCG PO TABS: 100.0000 ug | ORAL_TABLET | Freq: Every day | ORAL | 1 refills | Status: DC

## 2020-08-21 NOTE — Progress Notes (Signed)
08/21/2020            Endocrinology follow-up note   Subjective:    Patient ID: Hailey Thompson, female    DOB: 08-29-1995, PCP Muse, Noel Journey., PA-C   Past Medical History:  Diagnosis Date  . Hypothyroidism   . Migraine   . Thyroid disease   . UTI (lower urinary tract infection)    Past Surgical History:  Procedure Laterality Date  . APPENDECTOMY    . CESAREAN SECTION    . CESAREAN SECTION N/A 05/14/2019   Procedure: CESAREAN SECTION;  Surgeon: Jonnie Kind, MD;  Location: [facility];  Service: Obstetrics;  Laterality: N/A;  . CHOLECYSTECTOMY N/A 11/06/2015   Procedure: LAPAROSCOPIC CHOLECYSTECTOMY;  Surgeon: Vickie Epley, MD;  Location: [facility]S;  Service: General;  Laterality: N/A;   Social History   Socioeconomic History  . Marital status: Married    Spouse name: dustin  . Number of children: 1  . Years of education: 51  . Highest education level: 10th grade  Occupational History  . Occupation: homemaker  Tobacco Use  . Smoking status: Never Smoker  . Smokeless tobacco: Never Used  Vaping Use  . Vaping Use: Never used  Substance and Sexual Activity  . Alcohol use: No  . Drug use: No  . Sexual activity: Not Currently    Birth control/protection: None  Other Topics Concern  . Not on file  Social History Narrative  . Not on file   Social Determinants of Health   Financial Resource Strain: Not on file  Food Insecurity: Not on file  Transportation Needs: Not on file  Physical Activity: Not on file  Stress: Not on file  Social Connections: Not on file   Outpatient Encounter Medications as of 08/21/2020  Medication Sig  . CVS D3 125 MCG (5000 UT) capsule TAKE 1 CAPSULE BY MOUTH EVERY DAY  . ferrous sulfate 325 (65 FE) MG tablet TAKE 1 TABLET BY MOUTH ONCE DAILY WITH BREAKFAST  . ibuprofen (ADVIL) 800 MG tablet Take 1 tablet (800 mg total) by mouth every 6 (six) hours.  Marland Kitchen levothyroxine (SYNTHROID) 100 MCG tablet Take 1 tablet (100 mcg total) by mouth  daily before breakfast.  . [DISCONTINUED] famotidine (PEPCID) 20 MG tablet Take 1 tablet (20 mg total) by mouth 2 (two) times daily.  . [DISCONTINUED] levothyroxine (SYNTHROID) 100 MCG tablet Take 1 tablet (100 mcg total) by mouth daily before breakfast.   No facility-administered encounter medications on file as of 08/21/2020.   ALLERGIES: Allergies  Allergen Reactions  . Mushroom Extract Complex Anaphylaxis  . Chocolate Itching  . Oxycodone Diarrhea  . Prednisone Other (See Comments)    hallucinations  . Versed [Midazolam] Hives    Hives after IV administration of zofran and versed. Unsure which medication caused reaction  . Zofran [Ondansetron Hcl] Hives    Hives after IV adminstration of zofran and versed. Unsure of which medication caused reaction.     VACCINATION STATUS: Immunization History  Administered Date(s) Administered  . Influenza Whole 02/14/2011  . Influenza,inj,Quad PF,6+ Mos 03/18/2019  . Tdap 03/18/2019    HPI 25 year old female patient with medical history as above.  She is being seen in follow-up for hypothyroidism due to Hashimoto's thyroiditis, vitamin D deficiency, iron deficiency anemia .  She has no new complaints today.   Her labs are consistent with appropriate replacement.  She reports anxiety, palpitations.   She is also on treatment with iron supplement for iron deficiency anemia.   -  She reports that she was diagnosed with hypothyroidism at age 65 years, treated with various doses of levothyroxine over the years. -In the past, there has been documented inconsistencies in her intake of his thyroid hormone .    - She has family history of hypothyroidism in her mother. She denies family history of thyroid cancer. She denies any personal history of goiter.   Review of Systems Limited as above.    Objective:    BP (!) 100/58   Pulse 72   Ht '4\' 6"'$  (1.372 m)   Wt 188 lb 12.8 oz (85.6 kg)   BMI 45.52 kg/m   Wt Readings from Last 3 Encounters:   08/21/20 188 lb 12.8 oz (85.6 kg)  06/15/20 183 lb (83 kg)  04/17/20 181 lb 3.2 oz (82.2 kg)    Physical Exam- Limited   CMP     Component Value Date/Time   NA 136 05/12/2019 0850   K 3.9 05/12/2019 0850   CL 107 05/12/2019 0850   CO2 20 (L) 05/12/2019 0850   GLUCOSE 86 05/12/2019 0850   BUN 10 05/12/2019 0850   CREATININE 0.82 05/14/2019 1222   CREATININE 0.56 07/23/2013 0815   CALCIUM 8.4 (L) 05/12/2019 0850   CALCIUM 8.9 07/19/2011 1547   PROT 6.5 05/12/2019 0850   ALBUMIN 1.9 (L) 05/12/2019 0850   AST 26 05/12/2019 0850   ALT 22 05/12/2019 0850   ALKPHOS 138 (H) 05/12/2019 0850   BILITOT 0.5 05/12/2019 0850   GFRNONAA >60 05/14/2019 1222   GFRAA >60 05/14/2019 1222     Diabetic Labs (most recent): Lab Results  Component Value Date   HGBA1C 5.0 02/04/2017   HGBA1C 5.1 07/16/2012   HGBA1C 5.4 10/25/2011     Lipid Panel ( most recent) Lipid Panel     Component Value Date/Time   CHOL 156 07/23/2013 0815   TRIG 131 07/23/2013 0815   HDL 42 07/23/2013 0815   CHOLHDL 3.7 07/23/2013 0815   VLDL 26 07/23/2013 0815   LDLCALC 88 07/23/2013 0815   Recent Results (from the past 2160 hour(s))  CBC with Differential/Platelet     Status: Abnormal   Collection Time: 06/12/20  2:45 PM  Result Value Ref Range   WBC 12.3 (H) 4.0 - 10.5 K/uL   RBC 3.90 3.87 - 5.11 MIL/uL   Hemoglobin 10.9 (L) 12.0 - 15.0 g/dL   HCT 35.3 (L) 36.0 - 46.0 %   MCV 90.5 80.0 - 100.0 fL   MCH 27.9 26.0 - 34.0 pg   MCHC 30.9 30.0 - 36.0 g/dL   RDW 14.0 11.5 - 15.5 %   Platelets 340 150 - 400 K/uL   nRBC 0.0 0.0 - 0.2 %   Neutrophils Relative % 72 %   Neutro Abs 8.8 (H) 1.7 - 7.7 K/uL   Lymphocytes Relative 21 %   Lymphs Abs 2.5 0.7 - 4.0 K/uL   Monocytes Relative 4 %   Monocytes Absolute 0.5 0.1 - 1.0 K/uL   Eosinophils Relative 3 %   Eosinophils Absolute 0.4 0.0 - 0.5 K/uL   Basophils Relative 0 %   Basophils Absolute 0.1 0.0 - 0.1 K/uL   Immature Granulocytes 0 %   Abs Immature  Granulocytes 0.04 0.00 - 0.07 K/uL    Comment: Performed at [facility], [address] [place], [place] [address]  T4, free     Status: Abnormal   Collection Time: 06/12/20  2:46 PM  Result Value Ref Range   Free T4 1.38 (  H) 0.61 - 1.12 ng/dL    Comment: (NOTE) Biotin ingestion may interfere with free T4 tests. If the results are inconsistent with the TSH level, previous test results, or the clinical presentation, then consider biotin interference. If needed, order repeat testing after stopping biotin. Performed at [facility], [place] [address] [place], [place] [address]   TSH     Status: Abnormal   Collection Time: 06/12/20  2:46 PM  Result Value Ref Range   TSH 0.227 (L) 0.350 - 4.500 uIU/mL    Comment: Performed by a 3rd Generation assay with a functional sensitivity of <=0.01 uIU/mL. Performed at [facility], [address], [place], [place] [address]   T4, free     Status: None   Collection Time: 08/14/20 10:29 AM  Result Value Ref Range   Free T4 1.05 0.61 - 1.12 ng/dL    Comment: (NOTE) Biotin ingestion may interfere with free T4 tests. If the results are inconsistent with the TSH level, previous test results, or the clinical presentation, then consider biotin interference. If needed, order repeat testing after stopping biotin. Performed at [facility], [place] [address], [place], [place] [address]   CBC with Differential/Platelet     Status: Abnormal   Collection Time: 08/14/20 10:29 AM  Result Value Ref Range   WBC 12.2 (H) 4.0 - 10.5 K/uL   RBC 3.84 (L) 3.87 - 5.11 MIL/uL   Hemoglobin 11.2 (L) 12.0 - 15.0 g/dL   HCT 35.1 (L) 36.0 - 46.0 %   MCV 91.4 80.0 - 100.0 fL   MCH 29.2 26.0 - 34.0 pg   MCHC 31.9 30.0 - 36.0 g/dL   RDW 13.3 11.5 - 15.5 %   Platelets 320 150 - 400 K/uL   nRBC 0.0 0.0 - 0.2 %   Neutrophils Relative % 73 %   Neutro Abs 9.0 (H) 1.7 - 7.7 K/uL   Lymphocytes Relative 19 %   Lymphs Abs 2.3 0.7 - 4.0 K/uL   Monocytes Relative 4 %    Monocytes Absolute 0.5 0.1 - 1.0 K/uL   Eosinophils Relative 3 %   Eosinophils Absolute 0.3 0.0 - 0.5 K/uL   Basophils Relative 1 %   Basophils Absolute 0.1 0.0 - 0.1 K/uL   Immature Granulocytes 0 %   Abs Immature Granulocytes 0.05 0.00 - 0.07 K/uL    Comment: Performed at [facility], [address], [place], [place] [address]  TSH     Status: None   Collection Time: 08/14/20 10:30 AM  Result Value Ref Range   TSH 2.709 0.350 - 4.500 uIU/mL    Comment: Performed by a 3rd Generation assay with a functional sensitivity of <=0.01 uIU/mL. Performed at [facility], [address], [place], [place] [address]      Assessment & Plan:   1. Hypothyroidism due to Hashimoto's thyroiditis 2.  anemia-chronic blood loss 3.  Vitamin D deficiency -Her previsit thyroid function tests are consistent with appropriate replacement.  She is advised to continue Synthroid 100 mcg p.o. daily before breakfast.    - We discussed about the correct intake of her thyroid hormone, on empty stomach at fasting, with water, separated by at least 30 minutes from breakfast and other medications,  and separated by more than 4 hours from calcium, iron, multivitamins, acid reflux medications (PPIs). -Patient is made aware of the fact that thyroid hormone replacement is needed for life, dose to be adjusted by periodic monitoring of thyroid function tests.   -  Her last CBC showed hemoglobin of 11.2, benefiting from iron supplement.  She is advised to continue  ferrous sulfate 325 mg p.o. once a day.  -Her vitamin D has corrected at thirty-five, improving from seventeen.  She is advised to maintain with vitamin D3 5000 units daily.     -She does not have diabetes/prediabetes, her recent A1c was 5%.     I spent 25 minutes in the care of the patient today including review of labs from Thyroid Function, CMP, and other relevant labs ; imaging/biopsy records (current and previous including abstractions from other  facilities); face-to-face time discussing  her lab results and symptoms, medications doses, her options of short and long term treatment based on the latest standards of care / guidelines;   and documenting the encounter.  Hailey Thompson  participated in the discussions, expressed understanding, and voiced agreement with the above plans.  All questions were answered to her satisfaction. she is encouraged to contact clinic should she have any questions or concerns prior to her return visit.   She is advised to continue close follow-up with her PCP [facility], PA-C.      - Time spent on this patient care encounter:  30 minutes of which 50% was spent in  counseling and the rest reviewing  her current and  previous labs / studies and medications  doses and developing a plan for long term care, and documenting this care. Hailey Thompson  participated in the discussions, expressed understanding, and voiced agreement with the above plans.  All questions were answered to her satisfaction. she is encouraged to contact clinic should she have any questions or concerns prior to her return visit.     Follow up plan: Return in about 6 months (around 02/21/2021) for F/U with Pre-visit Labs.  Glade Lloyd, MD Phone: [phone]  Fax: [phone]  -  This note was partially dictated with voice recognition software. Similar sounding words can be transcribed inadequately or may not  be corrected upon review.  08/21/2020, 11:07 AM

## 2020-09-03 ENCOUNTER — Other Ambulatory Visit: Payer: Self-pay | Admitting: "Endocrinology

## 2020-09-03 ENCOUNTER — Telehealth: Payer: Self-pay | Admitting: "Endocrinology

## 2020-09-03 DIAGNOSIS — E559 Vitamin D deficiency, unspecified: Secondary | ICD-10-CM

## 2020-09-03 DIAGNOSIS — E063 Autoimmune thyroiditis: Secondary | ICD-10-CM

## 2020-09-03 DIAGNOSIS — D500 Iron deficiency anemia secondary to blood loss (chronic): Secondary | ICD-10-CM

## 2020-09-03 MED ORDER — LEVOTHYROXINE SODIUM 100 MCG PO TABS: 100.0000 ug | ORAL_TABLET | Freq: Every day | ORAL | 1 refills | Status: DC

## 2020-09-03 MED ORDER — FERROUS SULFATE 325 (65 FE) MG PO TABS: 325.0000 mg | ORAL_TABLET | Freq: Every day | ORAL | 0 refills | Status: DC

## 2020-09-03 MED ORDER — CVS D3 125 MCG (5000 UT) PO CAPS: 5000.0000 [IU] | ORAL_CAPSULE | Freq: Every day | ORAL | 1 refills | Status: DC

## 2020-09-03 NOTE — Telephone Encounter (Signed)
Pt is calling and states she just moved back to [place] from [place] and she lost all her medication and can not find it in her stuff. She is requesting refills sent to pharmacy below.   [facility] [phone] - [place], [place] [address] [place] Phone:  [phone]  Fax:  [phone]       ferrous sulfate 325 (65 FE) MG tablet  levothyroxine (SYNTHROID) 100 MCG tablet  D3 125 MCG (5000 UT) capsule

## 2020-09-10 NOTE — Telephone Encounter (Signed)
Talked with pt, advised her we had sent in refills for all three medications to [facility] on [address] On 09/03/20. Advised her since they were early refills her insurance may not cover the cost. Understanding voiced.

## 2020-09-10 NOTE — Telephone Encounter (Signed)
Patient said that [facility] is telling her there is a insurance problem with her thyroid medication. She said she never had an issue before at [facility]. She is using [facility] now, she said if she needs to pay cash she will.

## 2020-10-16 ENCOUNTER — Ambulatory Visit: Payer: Medicaid Other | Admitting: Nurse Practitioner

## 2020-12-10 ENCOUNTER — Telehealth: Payer: Self-pay | Admitting: "Endocrinology

## 2020-12-10 NOTE — Telephone Encounter (Signed)
Pt aware.

## 2020-12-10 NOTE — Telephone Encounter (Signed)
Pt has an appt for Dec 2 but she states she is experiencing heart flutters again and would like to come in sooner, does she need to do her blood work before I sch

## 2020-12-11 ENCOUNTER — Other Ambulatory Visit (HOSPITAL_COMMUNITY)
Admission: RE | Admit: 2020-12-11 | Discharge: 2020-12-11 | Disposition: A | Discharge status: Home / Self Care | Payer: Medicaid Other | Source: Ambulatory Visit | Attending: "Endocrinology | Admitting: "Endocrinology

## 2020-12-11 ENCOUNTER — Other Ambulatory Visit: Payer: Self-pay

## 2020-12-11 DIAGNOSIS — E063 Autoimmune thyroiditis: Secondary | ICD-10-CM | POA: Insufficient documentation

## 2020-12-11 DIAGNOSIS — E559 Vitamin D deficiency, unspecified: Secondary | ICD-10-CM | POA: Diagnosis present

## 2020-12-11 DIAGNOSIS — E038 Other specified hypothyroidism: Secondary | ICD-10-CM | POA: Diagnosis not present

## 2020-12-11 LAB — TSH: TSH: 3.852 u[IU]/mL (ref 0.350–4.500)

## 2020-12-11 LAB — VITAMIN D 25 HYDROXY (VIT D DEFICIENCY, FRACTURES): Vit D, 25-Hydroxy: 66.41 ng/mL (ref 30–100)

## 2020-12-11 LAB — T4, FREE: Free T4: 1.34 ng/dL — ABNORMAL HIGH (ref 0.61–1.12)

## 2020-12-15 ENCOUNTER — Ambulatory Visit (INDEPENDENT_AMBULATORY_CARE_PROVIDER_SITE_OTHER): Payer: Medicaid Other | Admitting: "Endocrinology

## 2020-12-15 ENCOUNTER — Encounter: Payer: Self-pay | Admitting: "Endocrinology

## 2020-12-15 ENCOUNTER — Other Ambulatory Visit: Payer: Self-pay

## 2020-12-15 DIAGNOSIS — E038 Other specified hypothyroidism: Secondary | ICD-10-CM | POA: Diagnosis not present

## 2020-12-15 DIAGNOSIS — E063 Autoimmune thyroiditis: Secondary | ICD-10-CM

## 2020-12-15 MED ORDER — LEVOTHYROXINE SODIUM 88 MCG PO TABS
88.0000 ug | ORAL_TABLET | Freq: Every day | ORAL | 1 refills | Status: DC
Start: 2020-12-15 — End: 2020-12-28

## 2020-12-15 NOTE — Progress Notes (Signed)
12/15/2020            Endocrinology follow-up note   Subjective:    Patient ID: Hailey Thompson, female    DOB: 1996-02-14, PCP Muse, Noel Journey., PA-C   Past Medical History:  Diagnosis Date   Hypothyroidism    Migraine    Thyroid disease    UTI (lower urinary tract infection)    Past Surgical History:  Procedure Laterality Date   APPENDECTOMY     CESAREAN SECTION     CESAREAN SECTION N/A 05/14/2019   Procedure: CESAREAN SECTION;  Surgeon: Jonnie Kind, MD;  Location: [facility];  Service: Obstetrics;  Laterality: N/A;   CHOLECYSTECTOMY N/A 11/06/2015   Procedure: LAPAROSCOPIC CHOLECYSTECTOMY;  Surgeon: Vickie Epley, MD;  Location: [facility];  Service: General;  Laterality: N/A;   Social History   Socioeconomic History   Marital status: Married    Spouse name: Hailey Thompson   Number of children: 1   Years of education: 12   Highest education level: 10th grade  Occupational History   Occupation: homemaker  Tobacco Use   Smoking status: Never   Smokeless tobacco: Never  Vaping Use   Vaping Use: Never used  Substance and Sexual Activity   Alcohol use: No   Drug use: No   Sexual activity: Not Currently    Birth control/protection: None  Other Topics Concern   Not on file  Social History Narrative   Not on file   Social Determinants of Health   Financial Resource Strain: Not on file  Food Insecurity: Not on file  Transportation Needs: Not on file  Physical Activity: Not on file  Stress: Not on file  Social Connections: Not on file   Outpatient Encounter Medications as of 12/15/2020  Medication Sig   Cholecalciferol ([facility] D[facility]) 125 MCG (5000 UT) capsule Take 1 capsule (5,000 Units total) by mouth daily.   ferrous sulfate 325 (65 FE) MG tablet Take 1 tablet (325 mg total) by mouth daily with breakfast.   ibuprofen (ADVIL) 800 MG tablet Take 1 tablet (800 mg total) by mouth every 6 (six) hours.   levothyroxine (SYNTHROID) 88 MCG tablet Take 1 tablet (88 mcg total) by  mouth daily before breakfast.   [DISCONTINUED] famotidine (PEPCID) 20 MG tablet Take 1 tablet (20 mg total) by mouth 2 (two) times daily.   [DISCONTINUED] levothyroxine (SYNTHROID) 100 MCG tablet Take 1 tablet (100 mcg total) by mouth daily before breakfast.   No facility-administered encounter medications on file as of 12/15/2020.   ALLERGIES: Allergies  Allergen Reactions   Oxycodone Diarrhea   Prednisone Other (See Comments)    hallucinations   Versed [Midazolam] Hives    Hives after IV administration of zofran and versed. Unsure which medication caused reaction   Zofran [Ondansetron Hcl] Hives    Hives after IV adminstration of zofran and versed. Unsure of which medication caused reaction.     VACCINATION STATUS: Immunization History  Administered Date(s) Administered   Influenza Whole 02/14/2011   Influenza,inj,Quad PF,6+ Mos 03/18/2019   Tdap 03/18/2019    HPI 25 year old female patient with medical history as above.  She is being seen before her scheduled visit due to her complaint of palpitations with repeat thyroid function test.  She is following in this clinic regularly for hypothyroidism due to Hashimoto's thyroiditis, vitamin D deficiency, iron deficiency anemia.   -She has some weight gain.  She denies tremors, heat intolerance.  Palpitations are intermittent.   - She reports that she  was diagnosed with hypothyroidism at age 62 years, treated with various doses of levothyroxine over the years. -In the past, there has been documented inconsistencies in her intake of his thyroid hormone .  More recently, she is consistent and reliably taking her medication.  - She has family history of hypothyroidism in her mother. She denies family history of thyroid cancer. She denies any personal history of goiter.   Review of Systems Limited as above.    Objective:    BP (!) 120/58   Pulse 84   Ht '4\' 6"'$  (1.372 m)   Wt 194 lb (88 kg)   BMI 46.78 kg/m   Wt Readings from  Last 3 Encounters:  12/15/20 194 lb (88 kg)  08/21/20 188 lb 12.8 oz (85.6 kg)  06/15/20 183 lb (83 kg)    Physical Exam- Limited   CMP     Component Value Date/Time   NA 136 05/12/2019 0850   K 3.9 05/12/2019 0850   CL 107 05/12/2019 0850   CO2 20 (L) 05/12/2019 0850   GLUCOSE 86 05/12/2019 0850   BUN 10 05/12/2019 0850   CREATININE 0.82 05/14/2019 1222   CREATININE 0.56 07/23/2013 0815   CALCIUM 8.4 (L) 05/12/2019 0850   CALCIUM 8.9 07/19/2011 1547   PROT 6.5 05/12/2019 0850   ALBUMIN 1.9 (L) 05/12/2019 0850   AST 26 05/12/2019 0850   ALT 22 05/12/2019 0850   ALKPHOS 138 (H) 05/12/2019 0850   BILITOT 0.5 05/12/2019 0850   GFRNONAA >60 05/14/2019 1222   GFRAA >60 05/14/2019 1222     Diabetic Labs (most recent): Lab Results  Component Value Date   HGBA1C 5.0 02/04/2017   HGBA1C 5.1 07/16/2012   HGBA1C 5.4 10/25/2011     Lipid Panel ( most recent) Lipid Panel     Component Value Date/Time   CHOL 156 07/23/2013 0815   TRIG 131 07/23/2013 0815   HDL 42 07/23/2013 0815   CHOLHDL 3.7 07/23/2013 0815   VLDL 26 07/23/2013 0815   LDLCALC 88 07/23/2013 0815   Recent Results (from the past 2160 hour(s))  TSH     Status: None   Collection Time: 12/11/20  9:47 AM  Result Value Ref Range   TSH 3.852 0.350 - 4.500 uIU/mL    Comment: Performed by a 3rd Generation assay with a functional sensitivity of <=0.01 uIU/mL. Performed at [facility], [address] [place], [place] [address]   T4, free     Status: Abnormal   Collection Time: 12/11/20  9:47 AM  Result Value Ref Range   Free T4 1.34 (H) 0.61 - 1.12 ng/dL    Comment: (NOTE) Biotin ingestion may interfere with free T4 tests. If the results are inconsistent with the TSH level, previous test results, or the clinical presentation, then consider biotin interference. If needed, order repeat testing after stopping biotin. Performed at [facility], [place] [address], [place], [place] [address]   VITAMIN D 25  Hydroxy (Vit-D Deficiency, Fractures)     Status: None   Collection Time: 12/11/20  9:47 AM  Result Value Ref Range   Vit D, 25-Hydroxy 66.41 30 - 100 ng/mL    Comment: (NOTE) Vitamin D deficiency has been defined by the [place] practice guideline as a level of serum 25-OH  vitamin D less than 20 ng/mL (1,2). The Endocrine Society went on to  further define vitamin D insufficiency as a level between 21 and 29  ng/mL (  2).  1. IOM (Institute of Medicine). 2010. Dietary reference intakes for  calcium and D. [place]: The [employer]. 2. Holick MF, Binkley [place], Bischoff-Ferrari HA, et al. Evaluation,  treatment, and prevention of vitamin D deficiency: an Endocrine  Society clinical practice guideline, JCEM. 2011 Jul; 96(7): 1911-30.  Performed at [facility], [place] [address] [place], [place] [address]      Assessment & Plan:   1. Hypothyroidism due to Hashimoto's thyroiditis 2.  anemia-chronic blood loss 3.  Vitamin D deficiency -Her previsit thyroid function tests are consistent with slight over replacement.  I discussed and lowered her Synthroid to 88 mcg p.o. daily before breakfast.    - We discussed about the correct intake of her thyroid hormone, on empty stomach at fasting, with water, separated by at least 30 minutes from breakfast and other medications,  and separated by more than 4 hours from calcium, iron, multivitamins, acid reflux medications (PPIs). -Patient is made aware of the fact that thyroid hormone replacement is needed for life, dose to be adjusted by periodic monitoring of thyroid function tests.    -Her last CBC showed hemoglobin of 11.2, benefiting from iron supplement.  She is advised to continue ferrous sulfate 325 mg p.o. daily at breakfast.  -Her vitamin D has corrected at 66, this is improving from 17.  She is advised to maintain by taking vitamin D3 5000 units every other day.      -She  does not have diabetes/prediabetes, her recent A1c was 5%.      I spent 25 minutes in the care of the patient today including review of labs from Thyroid Function, CMP, and other relevant labs ; imaging/biopsy records (current and previous including abstractions from other facilities); face-to-face time discussing  her lab results and symptoms, medications doses, her options of short and long term treatment based on the latest standards of care / guidelines;   and documenting the encounter.  Hailey Thompson  participated in the discussions, expressed understanding, and voiced agreement with the above plans.  All questions were answered to her satisfaction. she is encouraged to contact clinic should she have any questions or concerns prior to her return visit.    She is advised to continue close follow-up with her PCP [facility], PA-C.      - Time spent on this patient care encounter:  30 minutes of which 50% was spent in  counseling and the rest reviewing  her current and  previous labs / studies and medications  doses and developing a plan for long term care, and documenting this care. Hailey Thompson  participated in the discussions, expressed understanding, and voiced agreement with the above plans.  All questions were answered to her satisfaction. she is encouraged to contact clinic should she have any questions or concerns prior to her return visit.     Follow up plan: Return in about 3 months (around 03/16/2021) for F/U with Pre-visit Labs.  Glade Lloyd, MD Phone: [phone]  Fax: [phone]  -  This note was partially dictated with voice recognition software. Similar sounding words can be transcribed inadequately or may not  be corrected upon review.  12/15/2020, 5:09 PM

## 2020-12-15 NOTE — Patient Instructions (Signed)

## 2020-12-18 ENCOUNTER — Ambulatory Visit: Payer: Medicaid Other | Admitting: "Endocrinology

## 2020-12-28 ENCOUNTER — Other Ambulatory Visit: Payer: Self-pay

## 2020-12-28 DIAGNOSIS — E063 Autoimmune thyroiditis: Secondary | ICD-10-CM

## 2020-12-28 DIAGNOSIS — E038 Other specified hypothyroidism: Secondary | ICD-10-CM

## 2020-12-28 MED ORDER — LEVOTHYROXINE SODIUM 88 MCG PO TABS: 88.0000 ug | ORAL_TABLET | Freq: Every day | ORAL | 0 refills | Status: DC

## 2021-02-26 ENCOUNTER — Ambulatory Visit: Payer: Medicaid Other | Admitting: "Endocrinology

## 2021-03-01 ENCOUNTER — Telehealth: Payer: Self-pay | Admitting: "Endocrinology

## 2021-03-01 DIAGNOSIS — D500 Iron deficiency anemia secondary to blood loss (chronic): Secondary | ICD-10-CM

## 2021-03-01 DIAGNOSIS — E559 Vitamin D deficiency, unspecified: Secondary | ICD-10-CM

## 2021-03-01 MED ORDER — CVS D3 125 MCG (5000 UT) PO CAPS: 5000.0000 [IU] | ORAL_CAPSULE | ORAL | 1 refills | Status: DC

## 2021-03-01 MED ORDER — FERROUS SULFATE 325 (65 FE) MG PO TABS: 325.0000 mg | ORAL_TABLET | Freq: Every day | ORAL | 1 refills | Status: DC

## 2021-03-01 NOTE — Telephone Encounter (Signed)
Rx refill(s) sent.

## 2021-03-01 NOTE — Telephone Encounter (Signed)
Pt is calling and is needing a refill on her medications, she switched pharmacy and the new is below.  Cholecalciferol ([facility] D[facility]) 125 MCG (5000 UT) capsule   ferrous sulfate 325 (65 FE) MG tablet   [id number] [place],  - [address] Phone:  [phone]  Fax:  [phone]

## 2021-04-02 ENCOUNTER — Ambulatory Visit: Payer: Medicaid Other | Admitting: "Endocrinology

## 2021-04-09 ENCOUNTER — Other Ambulatory Visit (HOSPITAL_COMMUNITY)
Admission: RE | Admit: 2021-04-09 | Discharge: 2021-04-09 | Disposition: A | Discharge status: Home / Self Care | Payer: Medicaid Other | Source: Ambulatory Visit | Attending: "Endocrinology | Admitting: "Endocrinology

## 2021-04-09 ENCOUNTER — Other Ambulatory Visit: Payer: Self-pay

## 2021-04-09 DIAGNOSIS — E038 Other specified hypothyroidism: Secondary | ICD-10-CM | POA: Diagnosis present

## 2021-04-09 DIAGNOSIS — E063 Autoimmune thyroiditis: Secondary | ICD-10-CM | POA: Diagnosis present

## 2021-04-09 LAB — T4, FREE: Free T4: 0.88 ng/dL (ref 0.61–1.12)

## 2021-04-09 LAB — TSH: TSH: 16.588 u[IU]/mL — ABNORMAL HIGH (ref 0.350–4.500)

## 2021-04-12 ENCOUNTER — Other Ambulatory Visit: Payer: Self-pay

## 2021-04-12 ENCOUNTER — Encounter: Payer: Self-pay | Admitting: "Endocrinology

## 2021-04-12 ENCOUNTER — Ambulatory Visit (INDEPENDENT_AMBULATORY_CARE_PROVIDER_SITE_OTHER): Payer: Medicaid Other | Admitting: "Endocrinology

## 2021-04-12 VITALS — BP 92/60 | HR 80 | Ht <= 58 in | Wt 192.2 lb

## 2021-04-12 DIAGNOSIS — E038 Other specified hypothyroidism: Secondary | ICD-10-CM

## 2021-04-12 DIAGNOSIS — E063 Autoimmune thyroiditis: Secondary | ICD-10-CM | POA: Diagnosis not present

## 2021-04-12 DIAGNOSIS — E559 Vitamin D deficiency, unspecified: Secondary | ICD-10-CM | POA: Diagnosis not present

## 2021-04-12 MED ORDER — SYNTHROID 100 MCG PO TABS
100.0000 ug | ORAL_TABLET | Freq: Every day | ORAL | 1 refills | Status: DC
Start: 2021-04-12 — End: 2021-07-30

## 2021-04-12 NOTE — Progress Notes (Signed)
04/12/2021            Endocrinology follow-up note   Subjective:    Patient ID: Hailey Thompson, female    DOB: April 01, 1995, PCP Muse, Noel Journey., PA-C   Past Medical History:  Diagnosis Date   Hypothyroidism    Migraine    Thyroid disease    UTI (lower urinary tract infection)    Past Surgical History:  Procedure Laterality Date   APPENDECTOMY     CESAREAN SECTION     CESAREAN SECTION N/A 05/14/2019   Procedure: CESAREAN SECTION;  Surgeon: Jonnie Kind, MD;  Location: [facility];  Service: Obstetrics;  Laterality: N/A;   CHOLECYSTECTOMY N/A 11/06/2015   Procedure: LAPAROSCOPIC CHOLECYSTECTOMY;  Surgeon: Vickie Epley, MD;  Location: [facility];  Service: General;  Laterality: N/A;   Social History   Socioeconomic History   Marital status: Married    Spouse name: dustin   Number of children: 1   Years of education: 12   Highest education level: 10th grade  Occupational History   Occupation: homemaker  Tobacco Use   Smoking status: Never   Smokeless tobacco: Never  Vaping Use   Vaping Use: Never used  Substance and Sexual Activity   Alcohol use: No   Drug use: No   Sexual activity: Not Currently    Birth control/protection: None  Other Topics Concern   Not on file  Social History Narrative   Not on file   Social Determinants of Health   Financial Resource Strain: Not on file  Food Insecurity: Not on file  Transportation Needs: Not on file  Physical Activity: Not on file  Stress: Not on file  Social Connections: Not on file   Outpatient Encounter Medications as of 04/12/2021  Medication Sig   SYNTHROID 100 MCG tablet Take 1 tablet (100 mcg total) by mouth daily before breakfast.   Cholecalciferol ([facility] D3) 125 MCG (5000 UT) capsule Take 1 capsule (5,000 Units total) by mouth every other day.   ferrous sulfate 325 (65 FE) MG tablet Take 1 tablet (325 mg total) by mouth daily with breakfast.   ibuprofen (ADVIL) 800 MG tablet Take 1 tablet (800 mg total) by  mouth every 6 (six) hours.   [DISCONTINUED] famotidine (PEPCID) 20 MG tablet Take 1 tablet (20 mg total) by mouth 2 (two) times daily.   [DISCONTINUED] levothyroxine (SYNTHROID) 88 MCG tablet Take 1 tablet (88 mcg total) by mouth daily before breakfast.   No facility-administered encounter medications on file as of 04/12/2021.   ALLERGIES: Allergies  Allergen Reactions   Oxycodone Diarrhea   Prednisone Other (See Comments)    hallucinations   Versed [Midazolam] Hives    Hives after IV administration of zofran and versed. Unsure which medication caused reaction   Zofran [Ondansetron Hcl] Hives    Hives after IV adminstration of zofran and versed. Unsure of which medication caused reaction.     VACCINATION STATUS: Immunization History  Administered Date(s) Administered   Influenza Whole 02/14/2011   Influenza,inj,Quad PF,6+ Mos 03/18/2019   Tdap 03/18/2019    HPI 26 year old female patient with medical history as above.  She is being seen with repeat thyroid function tests consistent with slight under replacement.  Her hypothyroidism is confirmed to be due to Hashimoto's thyroiditis.  She also has history of vitamin D deficiency, and iron deficiency anemia. -She has steady weight since last visit. -he denies tremors, heat intolerance.  Palpitations are intermittent.   - She reports that she was  diagnosed with hypothyroidism at age 89 years, treated with various doses of levothyroxine over the years.  She is currently on levothyroxine 88 mcg p.o. daily before breakfast.  She wants to consider the brand-name Synthroid.  - She has family history of hypothyroidism in her mother. She denies family history of thyroid cancer. She denies any personal history of goiter.   Review of Systems Limited as above.    Objective:    BP 92/60    Pulse 80    Ht 4\' 6"  (1.372 m)    Wt 192 lb 3.2 oz (87.2 kg)    BMI 46.34 kg/m   Wt Readings from Last 3 Encounters:  04/12/21 192 lb 3.2 oz (87.2  kg)  12/15/20 194 lb (88 kg)  08/21/20 188 lb 12.8 oz (85.6 kg)    Physical Exam- Limited   CMP     Component Value Date/Time   NA 136 05/12/2019 0850   K 3.9 05/12/2019 0850   CL 107 05/12/2019 0850   CO2 20 (L) 05/12/2019 0850   GLUCOSE 86 05/12/2019 0850   BUN 10 05/12/2019 0850   CREATININE 0.82 05/14/2019 1222   CREATININE 0.56 07/23/2013 0815   CALCIUM 8.4 (L) 05/12/2019 0850   CALCIUM 8.9 07/19/2011 1547   PROT 6.5 05/12/2019 0850   ALBUMIN 1.9 (L) 05/12/2019 0850   AST 26 05/12/2019 0850   ALT 22 05/12/2019 0850   ALKPHOS 138 (H) 05/12/2019 0850   BILITOT 0.5 05/12/2019 0850   GFRNONAA >60 05/14/2019 1222   GFRAA >60 05/14/2019 1222     Diabetic Labs (most recent): Lab Results  Component Value Date   HGBA1C 5.0 02/04/2017   HGBA1C 5.1 07/16/2012   HGBA1C 5.4 10/25/2011     Lipid Panel ( most recent) Lipid Panel     Component Value Date/Time   CHOL 156 07/23/2013 0815   TRIG 131 07/23/2013 0815   HDL 42 07/23/2013 0815   CHOLHDL 3.7 07/23/2013 0815   VLDL 26 07/23/2013 0815   LDLCALC 88 07/23/2013 0815   Recent Results (from the past 2160 hour(s))  TSH     Status: Abnormal   Collection Time: 04/09/21  9:30 AM  Result Value Ref Range   TSH 16.588 (H) 0.350 - 4.500 uIU/mL    Comment: Performed by a 3rd Generation assay with a functional sensitivity of <=0.01 uIU/mL. Performed at [facility], [address], [place], [place] [address]   T4, free     Status: None   Collection Time: 04/09/21  9:30 AM  Result Value Ref Range   Free T4 0.88 0.61 - 1.12 ng/dL    Comment: (NOTE) Biotin ingestion may interfere with free T4 tests. If the results are inconsistent with the TSH level, previous test results, or the clinical presentation, then consider biotin interference. If needed, order repeat testing after stopping biotin. Performed at [facility], [place] [address] [place], [place] [address]      Assessment & Plan:   1. Hypothyroidism due to  Hashimoto's thyroiditis 2.  anemia-chronic blood loss 3.  Vitamin D deficiency -Her previsit thyroid function tests are consistent with under replacement.  I discussed and switched her levothyroxine to Synthroid 100 mcg p.o. daily before breakfast.     - We discussed about the correct intake of her thyroid hormone, on empty stomach at fasting, with water, separated by at least 30 minutes from breakfast and other medications,  and separated by more than 4 hours from calcium, iron, multivitamins, acid  reflux medications (PPIs). -Patient is made aware of the fact that thyroid hormone replacement is needed for life, dose to be adjusted by periodic monitoring of thyroid function tests.    -Her last CBC showed hemoglobin of 11.2, benefiting from iron supplement.  She is advised to continue ferrous sulfate 325 mg p.o. daily at breakfast.  -Her vitamin D has corrected at 66, this is improving from 17.  She is advised to maintain by taking vitamin D3 5000 units every other day.      -She does not have diabetes/prediabetes, her recent A1c was 5%.     I spent 20 minutes in the care of the patient today including review of labs from Thyroid Function, CMP, and other relevant labs ; imaging/biopsy records (current and previous including abstractions from other facilities); face-to-face time discussing  her lab results and symptoms, medications doses, her options of short and long term treatment based on the latest standards of care / guidelines;   and documenting the encounter.  Hailey Thompson  participated in the discussions, expressed understanding, and voiced agreement with the above plans.  All questions were answered to her satisfaction. she is encouraged to contact clinic should she have any questions or concerns prior to her return visit.     She is advised to continue close follow-up with her PCP [facility], PA-C.      - Time spent on this patient care encounter:  30 minutes of which 50% was  spent in  counseling and the rest reviewing  her current and  previous labs / studies and medications  doses and developing a plan for long term care, and documenting this care. Hailey Thompson  participated in the discussions, expressed understanding, and voiced agreement with the above plans.  All questions were answered to her satisfaction. she is encouraged to contact clinic should she have any questions or concerns prior to her return visit.     Follow up plan: Return in 14 weeks (on 07/19/2021) for F/U with Pre-visit Labs.  Glade Lloyd, MD Phone: [phone]  Fax: [phone]  -  This note was partially dictated with voice recognition software. Similar sounding words can be transcribed inadequately or may not  be corrected upon review.  04/12/2021, 6:23 PM

## 2021-04-16 ENCOUNTER — Ambulatory Visit: Payer: Medicaid Other | Admitting: "Endocrinology

## 2021-07-23 ENCOUNTER — Other Ambulatory Visit (HOSPITAL_COMMUNITY)
Admission: RE | Admit: 2021-07-23 | Discharge: 2021-07-23 | Disposition: A | Discharge status: Home / Self Care | Payer: Medicaid Other | Source: Ambulatory Visit | Attending: "Endocrinology | Admitting: "Endocrinology

## 2021-07-23 ENCOUNTER — Ambulatory Visit: Payer: Medicaid Other | Admitting: "Endocrinology

## 2021-07-23 DIAGNOSIS — E063 Autoimmune thyroiditis: Secondary | ICD-10-CM | POA: Diagnosis present

## 2021-07-23 DIAGNOSIS — E038 Other specified hypothyroidism: Secondary | ICD-10-CM | POA: Insufficient documentation

## 2021-07-23 LAB — T4, FREE: Free T4: 1.16 ng/dL — ABNORMAL HIGH (ref 0.61–1.12)

## 2021-07-23 LAB — VITAMIN D 25 HYDROXY (VIT D DEFICIENCY, FRACTURES): Vit D, 25-Hydroxy: 49.39 ng/mL (ref 30–100)

## 2021-07-23 LAB — TSH: TSH: 2.185 u[IU]/mL (ref 0.350–4.500)

## 2021-07-30 ENCOUNTER — Ambulatory Visit (INDEPENDENT_AMBULATORY_CARE_PROVIDER_SITE_OTHER): Payer: Medicaid Other | Admitting: "Endocrinology

## 2021-07-30 ENCOUNTER — Encounter: Payer: Self-pay | Admitting: "Endocrinology

## 2021-07-30 VITALS — BP 98/72 | HR 68 | Ht <= 58 in | Wt 192.8 lb

## 2021-07-30 DIAGNOSIS — E559 Vitamin D deficiency, unspecified: Secondary | ICD-10-CM

## 2021-07-30 DIAGNOSIS — E063 Autoimmune thyroiditis: Secondary | ICD-10-CM

## 2021-07-30 DIAGNOSIS — E038 Other specified hypothyroidism: Secondary | ICD-10-CM | POA: Diagnosis not present

## 2021-07-30 MED ORDER — SYNTHROID 88 MCG PO TABS: 88.0000 ug | ORAL_TABLET | Freq: Every day | ORAL | 1 refills | Status: DC

## 2021-07-30 NOTE — Progress Notes (Signed)
?07/30/2021 ?          ? ?Endocrinology follow-up note ? ? ?Subjective:  ? ? Patient ID: Hailey Thompson, female    DOB: [birth year], PCP Muse, Noel Journey., PA-C ? ? ?Past Medical History:  ?Diagnosis Date  ? Hypothyroidism   ? Migraine   ? Thyroid disease   ? UTI (lower urinary tract infection)   ? ?Past Surgical History:  ?Procedure Laterality Date  ? APPENDECTOMY    ? CESAREAN SECTION    ? CESAREAN SECTION N/A 05/14/2019  ? Procedure: CESAREAN SECTION;  Surgeon: Jonnie Kind, MD;  Location: [facility];  Service: Obstetrics;  Laterality: N/A;  ? CHOLECYSTECTOMY N/A 11/06/2015  ? Procedure: LAPAROSCOPIC CHOLECYSTECTOMY;  Surgeon: Vickie Epley, MD;  Location: [facility];  Service: General;  Laterality: N/A;  ? ?Social History  ? ?Socioeconomic History  ? Marital status: Married  ?  Spouse name: dustin  ? Number of children: 1  ? Years of education: 52  ? Highest education level: 10th grade  ?Occupational History  ? Occupation: homemaker  ?Tobacco Use  ? Smoking status: Never  ? Smokeless tobacco: Never  ?Vaping Use  ? Vaping Use: Never used  ?Substance and Sexual Activity  ? Alcohol use: No  ? Drug use: No  ? Sexual activity: Not Currently  ?  Birth control/protection: None  ?Other Topics Concern  ? Not on file  ?Social History Narrative  ? Not on file  ? ?Social Determinants of Health  ? ?Financial Resource Strain: Not on file  ?Food Insecurity: Not on file  ?Transportation Needs: Not on file  ?Physical Activity: Not on file  ?Stress: Not on file  ?Social Connections: Not on file  ? ?Outpatient Encounter Medications as of 07/30/2021  ?Medication Sig  ? magnesium oxide (MAG-OX) 400 MG tablet Take by mouth.  ? SYNTHROID 88 MCG tablet Take 1 tablet (88 mcg total) by mouth daily before breakfast.  ? Cholecalciferol ([employer] D3) 125 MCG (5000 UT) capsule Take 1 capsule (5,000 Units total) by mouth every other day.  ? DULoxetine (CYMBALTA) 30 MG capsule Take 30 mg by mouth daily.  ? ferrous sulfate 325 (65 FE) MG tablet Take 1  tablet (325 mg total) by mouth daily with breakfast.  ? [DISCONTINUED] famotidine (PEPCID) 20 MG tablet Take 1 tablet (20 mg total) by mouth 2 (two) times daily.  ? [DISCONTINUED] ibuprofen (ADVIL) 800 MG tablet Take 1 tablet (800 mg total) by mouth every 6 (six) hours.  ? [DISCONTINUED] SYNTHROID 100 MCG tablet Take 1 tablet (100 mcg total) by mouth daily before breakfast.  ? ?No facility-administered encounter medications on file as of 07/30/2021.  ? ?ALLERGIES: ?Allergies  ?Allergen Reactions  ? Oxycodone Diarrhea  ? Prednisone Other (See Comments)  ?  hallucinations  ? Versed [Midazolam] Hives  ?  Hives after IV administration of zofran and versed. Unsure which medication caused reaction  ? Zofran [Ondansetron Hcl] Hives  ?  Hives after IV adminstration of zofran and versed. Unsure of which medication caused reaction.   ? ? ?VACCINATION STATUS: ?Immunization History  ?Administered Date(s) Administered  ? Influenza Whole 02/14/2011  ? Influenza,inj,Quad PF,6+ Mos 03/18/2019  ? Tdap 03/18/2019  ? ? ?HPI ?26 year old female patient with medical history as above.  She is being seen with repeat thyroid function tests consistent with slight over replacement.   ?Her hypothyroidism is confirmed to be due to Hashimoto's thyroiditis.  She also has history of vitamin D deficiency, and iron deficiency  anemia. ?-She has steady weight since last visit. ?Recently, she was diagnosed with CKD, following with the nephrology group. ?-he denies tremors, heat intolerance.  Palpitations are intermittent. ? ? ?- She reports that she was diagnosed with hypothyroidism at age 50 years, treated with various doses of levothyroxine over the years.  She is currently on levothyroxine 88 mcg p.o. daily before breakfast.  She wants to consider the brand-name Synthroid. ? ?- She has family history of hypothyroidism in her mother. She denies family history of thyroid cancer. She denies any personal history of goiter. ? ? ?Review of Systems ?Limited  as above. ? ? ? ?Objective:  ?  ?BP 98/72   Pulse 68   Ht 4\' 6"  (1.372 m)   Wt 192 lb 12.8 oz (87.5 kg)   BMI 46.49 kg/m?   ?Wt Readings from Last 3 Encounters:  ?07/30/21 192 lb 12.8 oz (87.5 kg)  ?04/12/21 192 lb 3.2 oz (87.2 kg)  ?12/15/20 194 lb (88 kg)  ?  ?Physical Exam- Limited ? ? ?CMP  ?   ?Component Value Date/Time  ? NA 136 05/12/2019 0850  ? K 3.9 05/12/2019 0850  ? CL 107 05/12/2019 0850  ? CO2 20 (L) 05/12/2019 0850  ? GLUCOSE 86 05/12/2019 0850  ? BUN 10 05/12/2019 0850  ? CREATININE 0.82 05/14/2019 1222  ? CREATININE 0.56 07/23/2013 0815  ? CALCIUM 8.4 (L) 05/12/2019 0850  ? CALCIUM 8.9 07/19/2011 1547  ? PROT 6.5 05/12/2019 0850  ? ALBUMIN 1.9 (L) 05/12/2019 0850  ? AST 26 05/12/2019 0850  ? ALT 22 05/12/2019 0850  ? ALKPHOS 138 (H) 05/12/2019 0850  ? BILITOT 0.5 05/12/2019 0850  ? GFRNONAA >60 05/14/2019 1222  ? GFRAA >60 05/14/2019 1222  ? ? ? ?Diabetic Labs (most recent): ?Lab Results  ?Component Value Date  ? HGBA1C 5.0 02/04/2017  ? HGBA1C 5.1 07/16/2012  ? HGBA1C 5.4 10/25/2011  ? ? ? Lipid Panel ( most recent) ?Lipid Panel  ?   ?Component Value Date/Time  ? CHOL 156 07/23/2013 0815  ? TRIG 131 07/23/2013 0815  ? HDL 42 07/23/2013 0815  ? CHOLHDL 3.7 07/23/2013 0815  ? VLDL 26 07/23/2013 0815  ? [place] 88 07/23/2013 0815  ? ?Recent Results (from the past 2160 hour(s))  ?TSH     Status: None  ? Collection Time: 07/23/21  9:48 AM  ?Result Value Ref Range  ? TSH 2.185 0.350 - 4.500 uIU/mL  ?  Comment: Performed by a 3rd Generation assay with a functional sensitivity of <=0.01 uIU/mL. ?Performed at [facility], [address] [place], [place] [address] ?  ?T4, free     Status: Abnormal  ? Collection Time: 07/23/21  9:48 AM  ?Result Value Ref Range  ? Free T4 1.16 (H) 0.61 - 1.12 ng/dL  ?  Comment: (NOTE) ?Biotin ingestion may interfere with free T4 tests. If the results are ?inconsistent with the TSH level, previous test results, or the ?clinical presentation, then consider biotin  interference. If needed, ?order repeat testing after stopping biotin. ?Performed at [facility], [place] [address], [place], [place] ?[address] ?  ?VITAMIN D 25 Hydroxy (Vit-D Deficiency, Fractures)     Status: None  ? Collection Time: 07/23/21  9:48 AM  ?Result Value Ref Range  ? Vit D, 25-Hydroxy 49.39 30 - 100 ng/mL  ?  Comment: (NOTE) ?Vitamin D deficiency has been defined by the Institute of Medicine  ?and an Endocrine Society practice guideline as a level of  serum 25-OH  ?vitamin D less than 20 ng/mL (1,2). The Endocrine Society went on to  ?further define vitamin D insufficiency as a level between 21 and 29  ?ng/mL (2). ? ?1. IOM [employer] of Medicine). 2010. Dietary reference intakes for  ?calcium and D. Welcome: The [employer]. ?2. Holick MF, Binkley Linwood, Bischoff-Ferrari HA, et al. Evaluation,  ?treatment, and prevention of vitamin D deficiency: an Endocrine  ?Society clinical practice guideline, JCEM. 2011 Jul; 96(7): 1911-30. ? ?Performed at [facility], [place] [address], [place], [place] ?[address] ?  ? ? ? ?Assessment & Plan:  ? ?1. Hypothyroidism due to Hashimoto's thyroiditis ?2.  anemia-chronic blood loss ?3.  Vitamin D deficiency ?-Her previsit thyroid function tests are consistent with slight over-replacement.  I discussed and lowered her Synthroid to 88 mg p.o. daily before breakfast.   ? ? - We discussed about the correct intake of her thyroid hormone, on empty stomach at fasting, with water, separated by at least 30 minutes from breakfast and other medications,  and separated by more than 4 hours from calcium, iron, multivitamins, acid reflux medications (PPIs). ?-Patient is made aware of the fact that thyroid hormone replacement is needed for life, dose to be adjusted by periodic monitoring of thyroid function tests. ? ? ?-Her last CBC showed hemoglobin of 11.2, benefiting from iron supplement.  She is advised to continue ferrous sulfate 325 mg p.o. daily at  breakfast. ? ?-Her vitamin D has corrected at 66, this is improving from 17.  She is advised to continue maintenance by taking vitamin D3 5000 units every other day.  She is advised to continue her close follow-up with nephro

## 2021-08-02 HISTORY — PX: RENAL BIOPSY: SHX156

## 2021-08-06 NOTE — Progress Notes (Signed)
? ?Office Visit Note ? ?Patient: Hailey Thompson             ?Date of Birth: February 27, 1996           ?MRN: [id number]             ?PCP: Raiford Simmonds., PA-C ?Referring: Tomasa Hose, NP ?Visit Date: 08/09/2021 ?Occupation: @GUAROCC @ ? ?Subjective:  ?Pain in multiple joints ? ?History of Present Illness: Hailey Thompson is a 26 y.o. female seen today for the evaluation of polyarthralgia.  Patient states that 7 years ago she started having abdominal pain for which she underwent cholecystectomy but despite the surgery the pain persist.  Patient states that she had extensive work-up by the gastroenterologist which was negative.  She has suffered from joint pain since she was in high school.  She describes discomfort in her cervical spine, lumbar spine, shoulders, elbows, wrists, hands, hips, knees and ankles.  She has not noticed joint swelling.  She always had painful menstrual cycles.  She states that she has taken ibuprofen for many years for joint pain and menstrual pain.  In February 2023 she went to see her PCP for the recurrence of epigastric pain.  At that time her labs showed elevated creatinine and she was referred to the nephrologist.  She has been seeing nephrologist since then.  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  She will be starting on prednisone per patient.  She was advised to stop NSAIDs and she has been taking Tylenol for pain management.  She denies any history of oral ulcers, nasal ulcers, malar rash, photosensitivity, Raynaud's phenomenon, lymphadenopathy.  She states that she always gets fever blisters in her mouth.  She has been experiencing palpitations and has cardiology work-up pending.  She is gravida 4, para 2, miscarriages 2.  There is no history of preeclampsia.  There is no family history of autoimmune disease besides thyroid disease. ?Family history remarkable for hypoplastic kidney and a half sister  requiring renal transplant, and her mother died at age 55 from renal failure who is diabetic. ? ?Activities of Daily Living:  ?Patient reports morning stiffness for 30-60 minutes.   ?Patient Reports nocturnal pain.  ?Difficulty dressing/grooming: Reports ?Difficulty climbing stairs: Reports ?Difficulty getting out of chair: Denies ?Difficulty using hands for taps, buttons, cutlery, and/or writing: Reports ? ?Review of Systems  ?Constitutional:  Positive for fatigue.  ?HENT:  Positive for mouth sores and mouth dryness. Negative for nose dryness.   ?Eyes:  Positive for dryness. Negative for pain and itching.  ?Respiratory:  Positive for shortness of breath. Negative for difficulty breathing.   ?Cardiovascular:  Positive for palpitations. Negative for chest pain.  ?Gastrointestinal:  Negative for blood in stool, constipation and diarrhea.  ?Endocrine: Positive for increased urination.  ?Genitourinary:  Negative for difficulty urinating.  ?Musculoskeletal:  Positive for joint pain, joint pain, myalgias, morning stiffness, muscle tenderness and myalgias. Negative for joint swelling.  ?Skin:  Positive for redness. Negative for color change, rash and sensitivity to sunlight.  ?Allergic/Immunologic: Positive for susceptible to infections.  ?Neurological:  Positive for dizziness, numbness, headaches and weakness. Negative for memory loss.  ?Hematological:  Positive for bruising/bleeding tendency. Negative for swollen glands.  ?Psychiatric/Behavioral:  Positive for depressed mood and sleep disturbance. Negative for confusion. The patient is nervous/anxious.   ? ?PMFS History:  ?Patient Active Problem List  ? Diagnosis Date Noted  ? Iron deficiency anemia  due to chronic blood loss 07/08/2019  ? Vitamin D deficiency 07/08/2019  ? COVID-19 04/18/2019  ? History of cesarean section 09/26/2018  ? Other fatigue 01/24/2018  ? Vitiligo 10/25/2011  ? Short stature 07/19/2011  ? Hypothyroidism 07/19/2011  ? Obesity 07/19/2011  ?  Hypercholesterolemia with hypertriglyceridemia 07/19/2011  ?  ?Past Medical History:  ?Diagnosis Date  ? Hypothyroidism   ? Migraine   ? Thyroid disease   ? UTI (lower urinary tract infection)   ?  ?Family History  ?Problem Relation Age of Onset  ? Diabetes Mother   ?     type 1 dx as young child. now with multiple complications  ? Thyroid disease Mother   ? Deep vein thrombosis Mother   ? Breast cancer Mother   ? Cirrhosis Mother   ? Bipolar disorder Mother   ? Depression Mother   ? Short stature Sister   ?     s/p kidney failure  ? Kidney disease Sister   ? Deafness Sister   ? Cancer Maternal Grandmother   ?     breast  ? Cancer Maternal Grandfather   ? Healthy Daughter   ? Healthy Son   ? ?Past Surgical History:  ?Procedure Laterality Date  ? APPENDECTOMY    ? as a child  ? CESAREAN SECTION  2016  ? CESAREAN SECTION N/A 05/14/2019  ? Procedure: CESAREAN SECTION;  Surgeon: Jonnie Kind, MD;  Location: [facility];  Service: Obstetrics;  Laterality: N/A;  ? CHOLECYSTECTOMY N/A 11/06/2015  ? Procedure: LAPAROSCOPIC CHOLECYSTECTOMY;  Surgeon: Vickie Epley, MD;  Location: [facility];  Service: General;  Laterality: N/A;  ? RENAL BIOPSY  08/02/2021  ? WISDOM TOOTH EXTRACTION    ? ?Social History  ? ?Social History Narrative  ? Not on file  ? ?Immunization History  ?Administered Date(s) Administered  ? Influenza Whole 02/14/2011  ? Influenza,inj,Quad PF,6+ Mos 03/18/2019  ? Tdap 03/18/2019  ?  ? ?Objective: ?Vital Signs: BP 113/78 (BP Location: Right Arm, Patient Position: Sitting, Cuff Size: Normal)   Pulse 80   Ht 4\' 7"  (1.397 m)   Wt 192 lb 3.2 oz (87.2 kg)   BMI 44.67 kg/m?   ? ?Physical Exam ?Vitals and nursing note reviewed.  ?Constitutional:   ?   Appearance: She is well-developed.  ?HENT:  ?   Head: Normocephalic and atraumatic.  ?Eyes:  ?   Conjunctiva/sclera: Conjunctivae normal.  ?Cardiovascular:  ?   Rate and Rhythm: Normal rate and regular rhythm.  ?   Heart sounds: Normal heart sounds.  ?Pulmonary:   ?   Effort: Pulmonary effort is normal.  ?   Breath sounds: Normal breath sounds.  ?Abdominal:  ?   General: Bowel sounds are normal.  ?   Palpations: Abdomen is soft.  ?Musculoskeletal:  ?   Cervical back: Normal range of motion.  ?Lymphadenopathy:  ?   Cervical: No cervical adenopathy.  ?Skin: ?   General: Skin is warm and dry.  ?   Capillary Refill: Capillary refill takes less than 2 seconds.  ?Neurological:  ?   Mental Status: She is alert and oriented to person, place, and time.  ?Psychiatric:     ?   Behavior: Behavior normal.  ?  ? ?Musculoskeletal Exam: C-spine, thoracic and lumbar spine with good range of motion.  Shoulder joints, elbow joints, wrist joints, MCPs PIPs and DIPs with good range of motion with no synovitis.  Hip joints, knee joints, ankles, MTPs and PIPs  with good range of motion with no synovitis.  She had generalized hyperalgesia and positive tender points. ? ?CDAI Exam: ?CDAI Score: -- ?Patient Global: --; Provider Global: -- ?Swollen: --; Tender: -- ?Joint Exam 08/09/2021  ? ?No joint exam has been documented for this visit  ? ?There is currently no information documented on the homunculus. Go to the Rheumatology activity and complete the homunculus joint exam. ? ?Investigation: ?No additional findings. ? ?Imaging: ?No results found. ? ?Recent Labs: ?Lab Results  ?Component Value Date  ? WBC 12.2 (H) 08/14/2020  ? HGB 11.2 (L) 08/14/2020  ? PLT 320 08/14/2020  ? NA 136 05/12/2019  ? K 3.9 05/12/2019  ? CL 107 05/12/2019  ? CO2 20 (L) 05/12/2019  ? GLUCOSE 86 05/12/2019  ? BUN 10 05/12/2019  ? CREATININE 0.82 05/14/2019  ? BILITOT 0.5 05/12/2019  ? ALKPHOS 138 (H) 05/12/2019  ? AST 26 05/12/2019  ? ALT 22 05/12/2019  ? PROT 6.5 05/12/2019  ? ALBUMIN 1.9 (L) 05/12/2019  ? CALCIUM 8.4 (L) 05/12/2019  ? GFRAA >60 05/14/2019  ? ?04/2021: Serum creatinine 1.41 ?05/2021: Serum creatinine 1.41, 1.51 ?July 13, 2021 creatinine 1.63, potassium 4.6 ?-- 24h urine UPEP without m spike 24h protein 92mg    ?-- Renal US 06/11/21: Right kidney:8.9 cm in length. Normal cortical thickness, echogenicity and hilar blood flow on color Doppler evaluation. No stones or hydronephrosis. ?Left kidney:9.7 cm in length. Normal cortex

## 2021-08-09 ENCOUNTER — Ambulatory Visit (INDEPENDENT_AMBULATORY_CARE_PROVIDER_SITE_OTHER): Payer: Medicaid Other

## 2021-08-09 ENCOUNTER — Encounter: Payer: Self-pay | Admitting: Rheumatology

## 2021-08-09 ENCOUNTER — Ambulatory Visit (INDEPENDENT_AMBULATORY_CARE_PROVIDER_SITE_OTHER): Payer: Medicaid Other | Admitting: Rheumatology

## 2021-08-09 VITALS — BP 113/78 | HR 80 | Ht <= 58 in | Wt 192.2 lb

## 2021-08-09 DIAGNOSIS — M79641 Pain in right hand: Secondary | ICD-10-CM

## 2021-08-09 DIAGNOSIS — E790 Hyperuricemia without signs of inflammatory arthritis and tophaceous disease: Secondary | ICD-10-CM

## 2021-08-09 DIAGNOSIS — Z8659 Personal history of other mental and behavioral disorders: Secondary | ICD-10-CM

## 2021-08-09 DIAGNOSIS — M79642 Pain in left hand: Secondary | ICD-10-CM

## 2021-08-09 DIAGNOSIS — E782 Mixed hyperlipidemia: Secondary | ICD-10-CM

## 2021-08-09 DIAGNOSIS — M791 Myalgia, unspecified site: Secondary | ICD-10-CM

## 2021-08-09 DIAGNOSIS — G8929 Other chronic pain: Secondary | ICD-10-CM

## 2021-08-09 DIAGNOSIS — M79672 Pain in left foot: Secondary | ICD-10-CM

## 2021-08-09 DIAGNOSIS — M25561 Pain in right knee: Secondary | ICD-10-CM | POA: Diagnosis not present

## 2021-08-09 DIAGNOSIS — Z8639 Personal history of other endocrine, nutritional and metabolic disease: Secondary | ICD-10-CM

## 2021-08-09 DIAGNOSIS — M79671 Pain in right foot: Secondary | ICD-10-CM

## 2021-08-09 DIAGNOSIS — R5383 Other fatigue: Secondary | ICD-10-CM

## 2021-08-09 DIAGNOSIS — E559 Vitamin D deficiency, unspecified: Secondary | ICD-10-CM

## 2021-08-09 DIAGNOSIS — R7982 Elevated C-reactive protein (CRP): Secondary | ICD-10-CM

## 2021-08-09 DIAGNOSIS — M25562 Pain in left knee: Secondary | ICD-10-CM | POA: Diagnosis not present

## 2021-08-09 DIAGNOSIS — N184 Chronic kidney disease, stage 4 (severe): Secondary | ICD-10-CM

## 2021-08-09 DIAGNOSIS — R002 Palpitations: Secondary | ICD-10-CM

## 2021-08-09 DIAGNOSIS — F5101 Primary insomnia: Secondary | ICD-10-CM

## 2021-08-09 DIAGNOSIS — M2550 Pain in unspecified joint: Secondary | ICD-10-CM

## 2021-08-09 DIAGNOSIS — N946 Dysmenorrhea, unspecified: Secondary | ICD-10-CM

## 2021-08-09 DIAGNOSIS — L80 Vitiligo: Secondary | ICD-10-CM

## 2021-08-09 DIAGNOSIS — Z862 Personal history of diseases of the blood and blood-forming organs and certain disorders involving the immune mechanism: Secondary | ICD-10-CM

## 2021-08-12 ENCOUNTER — Other Ambulatory Visit: Payer: Self-pay | Admitting: Rheumatology

## 2021-08-12 DIAGNOSIS — Z79899 Other long term (current) drug therapy: Secondary | ICD-10-CM

## 2021-08-12 DIAGNOSIS — R768 Other specified abnormal immunological findings in serum: Secondary | ICD-10-CM

## 2021-08-12 LAB — SEDIMENTATION RATE

## 2021-08-12 LAB — ANA: Anti Nuclear Antibody (ANA): POSITIVE — AB

## 2021-08-12 LAB — PROTEIN ELECTROPHORESIS, SERUM, WITH REFLEX
Albumin ELP: 3.7 g/dL — ABNORMAL LOW (ref 3.8–4.8)
Alpha 1: 0.4 g/dL — ABNORMAL HIGH (ref 0.2–0.3)
Alpha 2: 0.9 g/dL (ref 0.5–0.9)
Beta 2: 0.6 g/dL — ABNORMAL HIGH (ref 0.2–0.5)
Beta Globulin: 0.5 g/dL (ref 0.4–0.6)
Gamma Globulin: 1.6 g/dL (ref 0.8–1.7)
Total Protein: 7.7 g/dL (ref 6.1–8.1)

## 2021-08-12 LAB — SED RATE MANUAL WEST RFLX: SED RATE BY MODIFIED WESTERGREN,MANUAL: 31 mm/h — ABNORMAL HIGH (ref 0–20)

## 2021-08-12 LAB — ANTI-NUCLEAR AB-TITER (ANA TITER): ANA Titer 1: 1:1280 {titer} — ABNORMAL HIGH

## 2021-08-12 LAB — CYCLIC CITRUL PEPTIDE ANTIBODY, IGG: Cyclic Citrullin Peptide Ab: <16 UNITS

## 2021-08-12 LAB — CK: Total CK: 31 U/L (ref 29–143)

## 2021-08-12 LAB — SJOGRENS SYNDROME-A EXTRACTABLE NUCLEAR ANTIBODY: SSA (Ro) (ENA) Antibody, IgG: <1 AI

## 2021-08-12 LAB — RNP ANTIBODY: Ribonucleic Protein(ENA) Antibody, IgG: <1 AI

## 2021-08-12 LAB — ANTI-DNA ANTIBODY, DOUBLE-STRANDED: ds DNA Ab: 1 IU/mL

## 2021-08-12 LAB — ANTI-SMITH ANTIBODY: ENA SM Ab Ser-aCnc: <1 AI

## 2021-08-12 NOTE — Progress Notes (Signed)
Her ANA test is high titer positive.  Please have patient come in to get additional labs.  I have pended the orders.

## 2021-08-13 ENCOUNTER — Ambulatory Visit: Payer: Medicaid Other | Admitting: "Endocrinology

## 2021-08-13 NOTE — Progress Notes (Signed)
Patient had labs performed on 08/09/2021 through [facility] (they are in [facility])

## 2021-08-15 NOTE — Progress Notes (Unsigned)
Office Visit Note  Patient: Hailey Thompson             Date of Birth: 1995-06-14           MRN: [id number]             PCP: Raiford Simmonds., PA-C Referring: Tomasa Hose, NP Visit Date: 08/27/2021 Occupation: _0 @  Subjective:  Pain in multiple joints  History of Present Illness: Hailey Thompson is a 26 y.o. female with history of polyarthralgia and chronic kidney disease.  She returns a follow-up visit today and was accompanied by her husband.  Patient states that she has been experiencing extreme fatigue.  She has been experiencing sores in her mouth and dry mouth and dry eyes.  She also gives history of shortness of breath and palpitations.  She has generalized pain and discomfort.  She has not noticed any joint swelling.  She states her hands turn red but she has not noticed any white or blue discoloration.  She gets sunburn easily.  She has been reading about positive ANA and association of lupus and she feels that she has the symptoms of lupus.  Activities of Daily Living:  Patient reports morning stiffness for all day. Patient Reports nocturnal pain.  Difficulty dressing/grooming: Reports Difficulty climbing stairs: Reports Difficulty getting out of chair: Reports Difficulty using hands for taps, buttons, cutlery, and/or writing: Reports  Review of Systems  Constitutional:  Positive for fatigue.  HENT:  Positive for mouth sores and mouth dryness. Negative for nose dryness.   Eyes:  Positive for itching and dryness. Negative for pain.  Respiratory:  Positive for shortness of breath. Negative for difficulty breathing.   Cardiovascular:  Positive for palpitations. Negative for chest pain.  Gastrointestinal:  Positive for diarrhea. Negative for blood in stool and constipation.  Endocrine: Negative for increased urination.  Genitourinary:  Positive for difficulty urinating.  Musculoskeletal:  Positive for joint pain, joint pain, myalgias, morning stiffness, muscle  tenderness and myalgias. Negative for joint swelling.  Skin:  Positive for color change, rash, redness and sensitivity to sunlight.  Allergic/Immunologic: Positive for susceptible to infections.  Neurological:  Positive for dizziness, numbness, headaches, parasthesias, memory loss and weakness.  Hematological:  Positive for bruising/bleeding tendency. Negative for swollen glands.  Psychiatric/Behavioral:  Positive for depressed mood and sleep disturbance. Negative for confusion. The patient is nervous/anxious.    PMFS History:  Patient Active Problem List   Diagnosis Date Noted   Iron deficiency anemia due to chronic blood loss 07/08/2019   Vitamin D deficiency 07/08/2019   COVID-19 04/18/2019   History of cesarean section 09/26/2018   Other fatigue 01/24/2018   Vitiligo 10/25/2011   Short stature 07/19/2011   Hypothyroidism 07/19/2011   Obesity 07/19/2011   Hypercholesterolemia with hypertriglyceridemia 07/19/2011    Past Medical History:  Diagnosis Date   Hypothyroidism    Migraine    Thyroid disease    UTI (lower urinary tract infection)     Family History  Problem Relation Age of Onset   Diabetes Mother        type 1 dx as young child. now with multiple complications   Thyroid disease Mother    Deep vein thrombosis Mother    Breast cancer Mother    Cirrhosis Mother    Bipolar disorder Mother    Depression Mother    Short stature Sister        s/p kidney failure   Kidney disease Sister    Deafness Sister  Cancer Maternal Grandmother        breast   Cancer Maternal Grandfather    Healthy Daughter    Healthy Son    Past Surgical History:  Procedure Laterality Date   APPENDECTOMY     as a child   CESAREAN SECTION  2016   CESAREAN SECTION N/A 05/14/2019   Procedure: CESAREAN SECTION;  Surgeon: Jonnie Kind, MD;  Location: [facility];  Service: Obstetrics;  Laterality: N/A;   CHOLECYSTECTOMY N/A 11/06/2015   Procedure: LAPAROSCOPIC CHOLECYSTECTOMY;  Surgeon:  Vickie Epley, MD;  Location: [facility];  Service: General;  Laterality: N/A;   RENAL BIOPSY  08/02/2021   WISDOM TOOTH EXTRACTION     Social History   Social History Narrative   Not on file   Immunization History  Administered Date(s) Administered   Influenza Whole 02/14/2011   Influenza,inj,Quad PF,6+ Mos 03/18/2019   Tdap 03/18/2019     Objective: Vital Signs: BP 99/70 (BP Location: Left Arm, Patient Position: Sitting, Cuff Size: Normal)   Pulse 88   Ht _0  (1.372 m)   Wt 190 lb 6.4 oz (86.4 kg)   BMI 45.91 kg/m    Physical Exam Vitals and nursing note reviewed.  Constitutional:      Appearance: She is well-developed.  HENT:     Head: Normocephalic and atraumatic.  Eyes:     Conjunctiva/sclera: Conjunctivae normal.  Cardiovascular:     Rate and Rhythm: Normal rate and regular rhythm.     Heart sounds: Normal heart sounds.  Pulmonary:     Effort: Pulmonary effort is normal.     Breath sounds: Normal breath sounds.  Abdominal:     General: Bowel sounds are normal.     Palpations: Abdomen is soft.  Musculoskeletal:     Cervical back: Normal range of motion.  Lymphadenopathy:     Cervical: No cervical adenopathy.  Skin:    General: Skin is warm and dry.     Capillary Refill: Capillary refill takes less than 2 seconds.     Comments: Generalized facial erythema was noted.  Her hands were cold to touch.  No nailbed capillary changes were noted.  No oral ulcers were noted.  Neurological:     Mental Status: She is alert and oriented to person, place, and time.  Psychiatric:        Behavior: Behavior normal.     Musculoskeletal Exam: C-spine was in good range of motion.  Shoulder joints, elbow joints, wrist joints, MCPs PIPs and DIPs with good range of motion with no synovitis.  Hip joints, knee joints, ankles, MTPs and PIPs with good range of motion with no synovitis.  CDAI Exam: CDAI Score: -- Patient Global: --; Provider Global: -- Swollen: 0 ; Tender: 0   Joint Exam 08/27/2021   No joint exam has been documented for this visit   There is currently no information documented on the homunculus. Go to the Rheumatology activity and complete the homunculus joint exam.  Investigation: No additional findings.  Imaging: XR Foot 2 Views Left  Result Date: 08/09/2021 No MTP, PIP or DIP narrowing was noted.  No intertarsal, tibiotalar or or subtalar joint space narrowing was noted.  No erosive changes were noted. Impression: Unremarkable x-rays of the foot.  XR Foot 2 Views Right  Result Date: 08/09/2021 No MTP, PIP or DIP narrowing was noted.  No intertarsal, tibiotalar or or subtalar joint space narrowing was noted.  No erosive changes were noted. Impression: Unremarkable x-rays  of the foot.  XR Hand 2 View Left  Result Date: 08/09/2021 No MCP, CMC, PIP or DIP narrowing was noted.  No intercarpal or radiocarpal joint space narrowing was noted.  No erosive changes were noted. Impression: Unremarkable x-rays of the hand.  XR Hand 2 View Right  Result Date: 08/09/2021 No MCP, CMC, PIP or DIP narrowing was noted.  No intercarpal or radiocarpal joint space narrowing was noted.  No erosive changes were noted. Impression: Unremarkable x-rays of the hand.  XR KNEE 3 VIEW LEFT  Result Date: 08/09/2021 No medial or lateral compartment narrowing was noted.  No patellofemoral narrowing was noted.  No chondrocalcinosis was noted. Impression: Unremarkable x-rays of the knee.  XR KNEE 3 VIEW RIGHT  Result Date: 08/09/2021 No medial or lateral compartment narrowing was noted.  No patellofemoral narrowing was noted.  No chondrocalcinosis was noted. Impression: Unremarkable x-rays of the knee.   Recent Labs: Lab Results  Component Value Date   WBC 12.2 (H) 08/14/2020   HGB 11.2 (L) 08/14/2020   PLT 320 08/14/2020   NA 136 05/12/2019   K 3.9 05/12/2019   CL 107 05/12/2019   CO2 20 (L) 05/12/2019   GLUCOSE 86 05/12/2019   BUN 10 05/12/2019    CREATININE 0.82 05/14/2019   BILITOT 0.5 05/12/2019   ALKPHOS 138 (H) 05/12/2019   AST 26 05/12/2019   ALT 22 05/12/2019   PROT 7.7 08/09/2021   ALBUMIN 1.9 (L) 05/12/2019   CALCIUM 8.4 (L) 05/12/2019   GFRAA >60 05/14/2019   Aug 09, 2021 SPEP negative, ANA 1: 1280 cytoplasmic and speckled, dsDNA negative, SSA negative, Smith negative, RNP negative, CK 31, ESR 31, anti-CCP negative, beta-2 GP 1 negative, anticardiolipin antibodies negative, lupus anticoagulant negative, immunoglobulins IgG mildly elevated, protein creatinine ratio normal, TPMT normal, G6PD normal   05/21/21: Uric acid 9.4, ANA negative, CRP 25, RF 11.4, ASO 163, TSH 5.82   Speciality Comments: No specialty comments available.  Procedures:  No procedures performed Allergies: Oxycodone, Prednisone, Versed [midazolam], and Zofran [ondansetron hcl]   Assessment / Plan:     Visit Diagnoses: Positive ANA (antinuclear antibody) - ANA positive cytoplasmic speckled pattern, ENA negative, anticardiolipin negative, lupus anticoagulant negative, beta-2 GP 1 negative, C3-C4 normal.  She has high titer positive ANA but ENA panel is completely negative.  I had a detailed discussion with the patient and her husband.  She states that she has been reading about systemic lupus and feels that she has several symptoms.  She gives history of redness on her face, oral ulcers, dry mouth, photosensitivity and joint pain.  On my examination today she had no oral ulcers or nasal ulcers.  No photosensitivity or inflammatory arthritis was noted.  No synovitis was noted.  She gives history of dry mouth and dry eyes.  SSA antibody is negative.  I discussed with the patient and her husband that if she feels that she has lupus she can always get a second opinion from a rheumatologist.   Polyarthralgia - History of polyarthralgia since patient was in high school.  No synovitis was noted at the last visit.  Possibility of fibromyalgia was discussed.  Pain in  both hands - No synovitis noted.  X-rays were unremarkable.  No nailbed capillary changes, sclerodactyly or Telengectesia's were noted.  Chronic pain of both knees - No warmth swelling or effusion was noted.  X-rays were unremarkable.  Pain in both feet - No synovitis was noted.  X-rays were unremarkable.  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  I later spoke with Dr. Wyman Songster.  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  He started patient on CellCept and is expecting her to respond to CellCept.  He states all autoimmune work-up done by his office was also negative.  Dr. Wyman Songster does not see the need for further rheumatology involvement at this point.  He will refer patient back to me if needed.  I called and notified patient.  Advised her to contact me if she develops any new symptoms.  She also has further genetic testing pending.  Hyperuricemia - uric acid was elevated at 9.4 in February 2023.  Myalgia - CK normal.  Most likely myofascial pain syndrome.  Other fatigue-she gives history of chronic fatigue since she was in  high school.-He has history of chronic insomnia.  Other medical problems are listed as follows:  Primary insomnia  Palpitations  Hypercholesterolemia with hypertriglyceridemia  Vitamin D deficiency  History of iron deficiency anemia  Vitiligo  History of hypothyroidism  History of depression  Dysmenorrhea  Orders: No orders of the defined types were placed in this encounter.  No orders of the defined types were placed in this encounter.    Follow-Up Instructions: Return if symptoms worsen or fail to improve, for +ANA.   Bo Merino, MD  Note - This record has been created using Editor, commissioning.  Chart creation  errors have been sought, but may not always  have been located. Such creation errors do not reflect on  the standard of medical care.

## 2021-08-18 ENCOUNTER — Encounter: Payer: Self-pay | Admitting: Rheumatology

## 2021-08-22 LAB — BETA-2 GLYCOPROTEIN ANTIBODIES
Beta-2 Glyco 1 IgA: <2 U/mL (ref <20.0)
Beta-2 Glyco 1 IgM: <2 U/mL (ref <20.0)
Beta-2 Glyco I IgG: <2 U/mL (ref <20.0)

## 2021-08-22 LAB — PROTEIN / CREATININE RATIO, URINE
Creatinine, Urine: 63 mg/dL (ref 20–275)
Protein/Creat Ratio: 143 mg/g creat (ref 24–184)
Protein/Creatinine Ratio: 0.143 mg/mg creat (ref 0.024–0.184)
Total Protein, Urine: 9 mg/dL (ref 5–24)

## 2021-08-22 LAB — IGG, IGA, IGM
IgG (Immunoglobin G), Serum: 1647 mg/dL — ABNORMAL HIGH (ref 600–1640)
IgM, Serum: 135 mg/dL (ref 50–300)
Immunoglobulin A: 379 mg/dL — ABNORMAL HIGH (ref 47–310)

## 2021-08-22 LAB — CARDIOLIPIN ANTIBODIES, IGG, IGM, IGA
Anticardiolipin IgA: <2 APL-U/mL (ref <20.0)
Anticardiolipin IgG: <2 GPL-U/mL (ref <20.0)
Anticardiolipin IgM: <2 MPL-U/mL (ref <20.0)

## 2021-08-22 LAB — LUPUS ANTICOAGULANT EVAL W/ REFLEX
PTT-LA Screen: 38 s (ref <=40)
dRVVT: 45 s (ref <=45)

## 2021-08-22 LAB — GLUCOSE 6 PHOSPHATE DEHYDROGENASE: G-6PDH: 18 U/g Hgb (ref 7.0–20.5)

## 2021-08-22 LAB — THIOPURINE METHYLTRANSFERASE (TPMT), RBC: Thiopurine Methyltransferase, RBC: 18 nmol/hr/mL RBC

## 2021-08-23 NOTE — Progress Notes (Signed)
I will discuss results at the follow-up visit.

## 2021-08-26 ENCOUNTER — Ambulatory Visit: Payer: Medicaid Other | Admitting: Rheumatology

## 2021-08-27 ENCOUNTER — Encounter: Payer: Self-pay | Admitting: Rheumatology

## 2021-08-27 ENCOUNTER — Ambulatory Visit (INDEPENDENT_AMBULATORY_CARE_PROVIDER_SITE_OTHER): Payer: Medicaid Other | Admitting: Rheumatology

## 2021-08-27 VITALS — BP 99/70 | HR 88 | Ht <= 58 in | Wt 190.4 lb

## 2021-08-27 DIAGNOSIS — R002 Palpitations: Secondary | ICD-10-CM

## 2021-08-27 DIAGNOSIS — R5383 Other fatigue: Secondary | ICD-10-CM

## 2021-08-27 DIAGNOSIS — M25561 Pain in right knee: Secondary | ICD-10-CM | POA: Diagnosis not present

## 2021-08-27 DIAGNOSIS — Z8659 Personal history of other mental and behavioral disorders: Secondary | ICD-10-CM

## 2021-08-27 DIAGNOSIS — M2550 Pain in unspecified joint: Secondary | ICD-10-CM

## 2021-08-27 DIAGNOSIS — M79642 Pain in left hand: Secondary | ICD-10-CM

## 2021-08-27 DIAGNOSIS — R768 Other specified abnormal immunological findings in serum: Secondary | ICD-10-CM

## 2021-08-27 DIAGNOSIS — G8929 Other chronic pain: Secondary | ICD-10-CM

## 2021-08-27 DIAGNOSIS — M25562 Pain in left knee: Secondary | ICD-10-CM

## 2021-08-27 DIAGNOSIS — Z8639 Personal history of other endocrine, nutritional and metabolic disease: Secondary | ICD-10-CM

## 2021-08-27 DIAGNOSIS — F5101 Primary insomnia: Secondary | ICD-10-CM

## 2021-08-27 DIAGNOSIS — E782 Mixed hyperlipidemia: Secondary | ICD-10-CM

## 2021-08-27 DIAGNOSIS — N946 Dysmenorrhea, unspecified: Secondary | ICD-10-CM

## 2021-08-27 DIAGNOSIS — Z862 Personal history of diseases of the blood and blood-forming organs and certain disorders involving the immune mechanism: Secondary | ICD-10-CM

## 2021-08-27 DIAGNOSIS — M79641 Pain in right hand: Secondary | ICD-10-CM

## 2021-08-27 DIAGNOSIS — E790 Hyperuricemia without signs of inflammatory arthritis and tophaceous disease: Secondary | ICD-10-CM

## 2021-08-27 DIAGNOSIS — M79671 Pain in right foot: Secondary | ICD-10-CM

## 2021-08-27 DIAGNOSIS — N184 Chronic kidney disease, stage 4 (severe): Secondary | ICD-10-CM

## 2021-08-27 DIAGNOSIS — L80 Vitiligo: Secondary | ICD-10-CM

## 2021-08-27 DIAGNOSIS — E559 Vitamin D deficiency, unspecified: Secondary | ICD-10-CM

## 2021-08-27 DIAGNOSIS — M79672 Pain in left foot: Secondary | ICD-10-CM

## 2021-08-27 DIAGNOSIS — M791 Myalgia, unspecified site: Secondary | ICD-10-CM

## 2021-08-29 ENCOUNTER — Other Ambulatory Visit: Payer: Self-pay | Admitting: "Endocrinology

## 2021-08-29 DIAGNOSIS — D500 Iron deficiency anemia secondary to blood loss (chronic): Secondary | ICD-10-CM

## 2021-08-29 DIAGNOSIS — E559 Vitamin D deficiency, unspecified: Secondary | ICD-10-CM

## 2021-09-08 DIAGNOSIS — R0600 Dyspnea, unspecified: Secondary | ICD-10-CM | POA: Insufficient documentation

## 2021-10-01 ENCOUNTER — Other Ambulatory Visit: Payer: Self-pay | Admitting: "Endocrinology

## 2021-10-01 MED ORDER — SYNTHROID 88 MCG PO TABS: 88.0000 ug | ORAL_TABLET | Freq: Every day | ORAL | 1 refills | Status: DC

## 2021-10-25 ENCOUNTER — Telehealth: Payer: Self-pay | Admitting: "Endocrinology

## 2021-10-25 DIAGNOSIS — E038 Other specified hypothyroidism: Secondary | ICD-10-CM

## 2021-10-25 NOTE — Telephone Encounter (Signed)
Pt is having headaches, she feels weird, body going out of wake, heart is racing, she said she is doing her labs soon and calling back for an appt. Can you place new orders for the next appt

## 2021-10-25 NOTE — Telephone Encounter (Signed)
Orders placed. Is there any further instructions for pt?

## 2021-10-25 NOTE — Telephone Encounter (Signed)
Noted  

## 2021-10-30 LAB — TSH: TSH: 4.61 u[IU]/mL — ABNORMAL HIGH (ref 0.450–4.500)

## 2021-10-30 LAB — COMPREHENSIVE METABOLIC PANEL
ALT: 13 IU/L (ref 0–32)
AST: 19 IU/L (ref 0–40)
Albumin/Globulin Ratio: 1.4 (ref 1.2–2.2)
Albumin: 4 g/dL (ref 4.0–5.0)
Alkaline Phosphatase: 127 IU/L — ABNORMAL HIGH (ref 44–121)
BUN/Creatinine Ratio: 13 (ref 9–23)
BUN: 17 mg/dL (ref 6–20)
Bilirubin Total: <0.2 mg/dL (ref 0.0–1.2)
CO2: 21 mmol/L (ref 20–29)
Calcium: 8.8 mg/dL (ref 8.7–10.2)
Chloride: 103 mmol/L (ref 96–106)
Creatinine, Ser: 1.28 mg/dL — ABNORMAL HIGH (ref 0.57–1.00)
Globulin, Total: 2.9 g/dL (ref 1.5–4.5)
Glucose: 144 mg/dL — ABNORMAL HIGH (ref 70–99)
Potassium: 3.9 mmol/L (ref 3.5–5.2)
Sodium: 138 mmol/L (ref 134–144)
Total Protein: 6.9 g/dL (ref 6.0–8.5)
eGFR: 59 mL/min/{1.73_m2} — ABNORMAL LOW (ref >59)

## 2021-10-30 LAB — T4, FREE: Free T4: 1.42 ng/dL (ref 0.82–1.77)

## 2021-11-01 ENCOUNTER — Other Ambulatory Visit: Payer: Self-pay | Admitting: "Endocrinology

## 2021-11-01 MED ORDER — SYNTHROID 100 MCG PO TABS: 100.0000 ug | ORAL_TABLET | Freq: Every day | ORAL | 2 refills | Status: DC

## 2021-11-01 NOTE — Addendum Note (Signed)
Addended by: Ellin Saba on: 11/01/2021 03:04 PM   Modules accepted: Orders

## 2021-11-01 NOTE — Telephone Encounter (Signed)
Dr Dorris Fetch Patient's labs came over, do you want to make any med changes?  Please Advise

## 2021-11-01 NOTE — Telephone Encounter (Signed)
Orders updated

## 2021-11-01 NOTE — Telephone Encounter (Signed)
Patient made aware. Please add new lab orders for appt in November. Thank you

## 2021-11-02 ENCOUNTER — Ambulatory Visit: Payer: Medicaid Other | Admitting: Rheumatology

## 2021-11-09 ENCOUNTER — Other Ambulatory Visit: Payer: Self-pay

## 2021-11-09 ENCOUNTER — Emergency Department (HOSPITAL_BASED_OUTPATIENT_CLINIC_OR_DEPARTMENT_OTHER)
Admission: EM | Admit: 2021-11-09 | Discharge: 2021-11-10 | Disposition: A | Discharge status: Home / Self Care | Payer: Medicaid Other | Attending: Emergency Medicine | Admitting: Emergency Medicine

## 2021-11-09 ENCOUNTER — Encounter (HOSPITAL_BASED_OUTPATIENT_CLINIC_OR_DEPARTMENT_OTHER): Payer: Self-pay | Admitting: Emergency Medicine

## 2021-11-09 DIAGNOSIS — Z8616 Personal history of COVID-19: Secondary | ICD-10-CM | POA: Diagnosis not present

## 2021-11-09 DIAGNOSIS — Z20822 Contact with and (suspected) exposure to covid-19: Secondary | ICD-10-CM | POA: Insufficient documentation

## 2021-11-09 DIAGNOSIS — R051 Acute cough: Secondary | ICD-10-CM | POA: Diagnosis not present

## 2021-11-09 DIAGNOSIS — Z79899 Other long term (current) drug therapy: Secondary | ICD-10-CM | POA: Insufficient documentation

## 2021-11-09 DIAGNOSIS — E039 Hypothyroidism, unspecified: Secondary | ICD-10-CM | POA: Insufficient documentation

## 2021-11-09 DIAGNOSIS — R059 Cough, unspecified: Secondary | ICD-10-CM | POA: Diagnosis present

## 2021-11-09 LAB — SARS CORONAVIRUS 2 BY RT PCR: SARS Coronavirus 2 by RT PCR: NEGATIVE

## 2021-11-09 NOTE — ED Triage Notes (Signed)
Woke up a few days ago with sore throat, chest congestion, cough,headache and "feels wheezy" Has tried cough drops with no report relief. No fevers reported at home. Coughing up "yellow-green"

## 2021-11-10 ENCOUNTER — Emergency Department (HOSPITAL_BASED_OUTPATIENT_CLINIC_OR_DEPARTMENT_OTHER): Payer: Medicaid Other

## 2021-11-10 NOTE — ED Provider Notes (Signed)
[facility] Provider Note  CSN: [id number] Arrival date & time: 11/09/21 2300  Chief Complaint(s) Cough  HPI Hailey Thompson is a 26 y.o. 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  Cough Cough characteristics:  Productive Sputum characteristics:  Green and yellow Severity:  Moderate Onset quality:  Gradual Duration:  1 week Timing:  Constant Progression:  Waxing and waning Chronicity:  New Context: sick contacts (possible)     Past Medical History Past Medical History:  Diagnosis Date   Hypothyroidism    Migraine    Thyroid disease    UTI (lower urinary tract infection)    Patient Active Problem List   Diagnosis Date Noted   Iron deficiency anemia due to chronic blood loss 07/08/2019   Vitamin D deficiency 07/08/2019   COVID-19 04/18/2019   History of cesarean section 09/26/2018   Other fatigue 01/24/2018   Vitiligo 10/25/2011   Short stature 07/19/2011   Hypothyroidism 07/19/2011   Obesity 07/19/2011   Hypercholesterolemia with hypertriglyceridemia 07/19/2011   Home Medication(s) Prior to Admission medications   Medication Sig Start Date End Date Taking? Authorizing Provider  celecoxib (CELEBREX) 100 MG capsule Take 100 mg by mouth 2 (two) times daily.    [provider]  Cholecalciferol (VITAMIN D3) 125 MCG (5000 UT) CAPS TAKE 1 CAPSULE (5,000 UNITS TOTAL) BY MOUTH EVERY OTHER DAY. 08/30/21   Cassandria Anger, MD  DULoxetine (CYMBALTA) 30 MG capsule Take 30 mg by mouth daily. Patient not taking: Reported on 08/27/2021 05/21/21   [provider]  DULoxetine (CYMBALTA) 60 MG capsule Take 1 capsule by mouth daily. 08/13/21   [provider]  ferrous sulfate 325 (65 FE) MG tablet TAKE 1 TABLET BY MOUTH EVERY DAY WITH BREAKFAST 08/30/21   Cassandria Anger, MD  magnesium oxide (MAG-OX) 400 MG tablet Take by mouth. 07/01/21   [provider]  mycophenolate  (CELLCEPT) 500 MG tablet Take 1,000 mg by mouth 2 (two) times daily.    [provider]  SYNTHROID 100 MCG tablet Take 1 tablet (100 mcg total) by mouth daily before breakfast. 11/01/21   Nida, Marella Chimes, MD  famotidine (PEPCID) 20 MG tablet Take 1 tablet (20 mg total) by mouth 2 (two) times daily. 06/11/14 06/11/14  Nat Christen, MD                                                                                                                                    Allergies Oxycodone, Prednisone, Versed [midazolam], and Zofran [ondansetron hcl]  Review of Systems Review of Systems  Respiratory:  Positive for cough.    As noted in HPI  Physical Exam Vital Signs  I have reviewed the triage vital signs BP 123/88 (BP Location: Right Arm)   Pulse 81   Temp 97.7 F (36.5 C)   Resp 17   LMP 10/16/2021 (  Exact Date)   SpO2 100%   Physical Exam Vitals reviewed.  Constitutional:      General: She is not in acute distress.    Appearance: She is well-developed. She is not diaphoretic.  HENT:     Head: Normocephalic and atraumatic.     Nose: Nose normal.  Eyes:     General: No scleral icterus.       Right eye: No discharge.        Left eye: No discharge.     Conjunctiva/sclera: Conjunctivae normal.     Pupils: Pupils are equal, round, and reactive to light.  Cardiovascular:     Rate and Rhythm: Normal rate and regular rhythm.     Heart sounds: No murmur heard.    No friction rub. No gallop.  Pulmonary:     Effort: Pulmonary effort is normal. No respiratory distress.     Breath sounds: Normal breath sounds. No stridor. No decreased breath sounds, wheezing, rhonchi or rales.  Abdominal:     General: There is no distension.     Palpations: Abdomen is soft.     Tenderness: There is no abdominal tenderness.  Musculoskeletal:        General: No tenderness.     Cervical back: Normal range of motion and neck supple.  Skin:    General: Skin is warm and dry.     Findings: No  erythema or rash.  Neurological:     Mental Status: She is alert and oriented to person, place, and time.     ED Results and Treatments Labs (all labs ordered are listed, but only abnormal results are displayed) Labs Reviewed  SARS CORONAVIRUS 2 BY RT PCR                                                                                                                         EKG  EKG Interpretation  Date/Time:    Ventricular Rate:    PR Interval:    QRS Duration:   QT Interval:    QTC Calculation:   R Axis:     Text Interpretation:         Radiology DG Chest Port 1 View  Result Date: 11/10/2021 CLINICAL DATA:  Chest congestion and productive cough EXAM: PORTABLE CHEST 1 VIEW COMPARISON:  Radiographs 03/01/2013 FINDINGS: No focal consolidation, pleural effusion, or pneumothorax. Normal cardiomediastinal silhouette. No acute osseous abnormality. IMPRESSION: No active disease. Electronically Signed   By: Placido Sou M.D.   On: 11/10/2021 01:49    Medications Ordered in ED Medications - No data to display  Procedures Procedures  (including critical care time)  Medical Decision Making / ED Course   Medical Decision Making Amount and/or Complexity of Data Reviewed Radiology: ordered.    Patient presents with viral symptoms for 1 week. adequate oral hydration. Rest of history as above.  Patient appears well. No signs of toxicity, patient is interactive. No hypoxia, tachypnea or other signs of respiratory distress. No sign of clinical dehydration. Lung exam clear. Rest of exam as above.  COVID negative Chest x-ray obtain given patient's history of immunosuppression which was negative for pneumonia or bronchitis.  Most consistent with viral illness   No evidence suggestive of pharyngitis, AOM.   Discussed symptomatic treatment with the  patient and they will follow closely with their PCP.        Final Clinical Impression(s) / ED Diagnoses Final diagnoses:  Acute cough   The patient appears reasonably screened and/or stabilized for discharge and I doubt any other medical condition or other [facility] requiring further screening, evaluation, or treatment in the ED at this time. I have discussed the findings, Dx and Tx plan with the patient/family who expressed understanding and agree(s) with the plan. Discharge instructions discussed at length. The patient/family was given strict return precautions who verbalized understanding of the instructions. No further questions at time of discharge.  Disposition: Discharge  Condition: Good  ED Discharge Orders     None       Follow Up: Raiford Simmonds., PA-C [place] [id number] [phone]  Call  to schedule an appointment for close follow up           This chart was dictated using voice recognition software.  Despite best efforts to proofread,  errors can occur which can change the documentation meaning.    Fatima Blank, MD 11/10/21 [phone]

## 2021-11-25 ENCOUNTER — Ambulatory Visit: Payer: Medicaid Other | Admitting: Rheumatology

## 2021-12-17 ENCOUNTER — Ambulatory Visit: Payer: Medicaid Other | Admitting: Rheumatology

## 2022-01-07 ENCOUNTER — Ambulatory Visit: Payer: Medicaid Other | Admitting: Rheumatology

## 2022-01-07 DIAGNOSIS — G8929 Other chronic pain: Secondary | ICD-10-CM | POA: Insufficient documentation

## 2022-01-31 ENCOUNTER — Other Ambulatory Visit: Payer: Self-pay | Admitting: "Endocrinology

## 2022-02-03 ENCOUNTER — Ambulatory Visit: Payer: Medicaid Other | Admitting: "Endocrinology

## 2022-02-04 ENCOUNTER — Ambulatory Visit: Payer: Medicaid Other | Admitting: "Endocrinology

## 2022-02-09 ENCOUNTER — Ambulatory Visit: Payer: Medicaid Other | Admitting: "Endocrinology

## 2022-02-11 ENCOUNTER — Ambulatory Visit: Payer: Medicaid Other | Admitting: Rheumatology

## 2022-03-02 ENCOUNTER — Other Ambulatory Visit: Payer: Self-pay | Admitting: "Endocrinology

## 2022-03-11 ENCOUNTER — Ambulatory Visit: Payer: Medicaid Other | Admitting: "Endocrinology

## 2022-03-11 HISTORY — PX: CYSTOSCOPY W/ DILATION OF BLADDER: SUR374

## 2022-03-15 DIAGNOSIS — F411 Generalized anxiety disorder: Secondary | ICD-10-CM | POA: Insufficient documentation

## 2022-03-16 DIAGNOSIS — F319 Bipolar disorder, unspecified: Secondary | ICD-10-CM | POA: Insufficient documentation

## 2022-03-30 ENCOUNTER — Other Ambulatory Visit: Payer: Self-pay | Admitting: "Endocrinology

## 2022-03-30 DIAGNOSIS — E559 Vitamin D deficiency, unspecified: Secondary | ICD-10-CM

## 2022-03-31 LAB — COMPREHENSIVE METABOLIC PANEL
ALT: 17 IU/L (ref 0–32)
AST: 20 IU/L (ref 0–40)
Albumin/Globulin Ratio: 1.1 — ABNORMAL LOW (ref 1.2–2.2)
Albumin: 3.8 g/dL — ABNORMAL LOW (ref 4.0–5.0)
Alkaline Phosphatase: 117 IU/L (ref 44–121)
BUN/Creatinine Ratio: 12 (ref 9–23)
BUN: 19 mg/dL (ref 6–20)
Bilirubin Total: 0.2 mg/dL (ref 0.0–1.2)
CO2: 23 mmol/L (ref 20–29)
Calcium: 8.9 mg/dL (ref 8.7–10.2)
Chloride: 106 mmol/L (ref 96–106)
Creatinine, Ser: 1.64 mg/dL — ABNORMAL HIGH (ref 0.57–1.00)
Globulin, Total: 3.4 g/dL (ref 1.5–4.5)
Glucose: 85 mg/dL (ref 70–99)
Potassium: 4.3 mmol/L (ref 3.5–5.2)
Sodium: 142 mmol/L (ref 134–144)
Total Protein: 7.2 g/dL (ref 6.0–8.5)
eGFR: 44 mL/min/{1.73_m2} — ABNORMAL LOW (ref >59)

## 2022-03-31 LAB — TSH: TSH: 2.34 u[IU]/mL (ref 0.450–4.500)

## 2022-03-31 LAB — T4, FREE: Free T4: 1.5 ng/dL (ref 0.82–1.77)

## 2022-04-08 ENCOUNTER — Ambulatory Visit (INDEPENDENT_AMBULATORY_CARE_PROVIDER_SITE_OTHER): Payer: Medicaid Other | Admitting: "Endocrinology

## 2022-04-08 ENCOUNTER — Encounter: Payer: Self-pay | Admitting: "Endocrinology

## 2022-04-08 VITALS — BP 100/78 | HR 76 | Ht <= 58 in | Wt 197.8 lb

## 2022-04-08 DIAGNOSIS — E063 Autoimmune thyroiditis: Secondary | ICD-10-CM | POA: Diagnosis not present

## 2022-04-08 DIAGNOSIS — E042 Nontoxic multinodular goiter: Secondary | ICD-10-CM | POA: Diagnosis not present

## 2022-04-08 DIAGNOSIS — E559 Vitamin D deficiency, unspecified: Secondary | ICD-10-CM | POA: Diagnosis not present

## 2022-04-08 DIAGNOSIS — E038 Other specified hypothyroidism: Secondary | ICD-10-CM

## 2022-04-08 NOTE — Progress Notes (Signed)
04/08/2022            Endocrinology follow-up note   Subjective:    Patient ID: Hailey Thompson, female    DOB: [birth year], PCP Muse, Noel Journey., PA-C   Past Medical History:  Diagnosis Date   Hypothyroidism    Migraine    Thyroid disease    UTI (lower urinary tract infection)    Past Surgical History:  Procedure Laterality Date   APPENDECTOMY     as a child   CESAREAN SECTION  2016   CESAREAN SECTION N/A 05/14/2019   Procedure: CESAREAN SECTION;  Surgeon: Jonnie Kind, MD;  Location: [facility];  Service: Obstetrics;  Laterality: N/A;   CHOLECYSTECTOMY N/A 11/06/2015   Procedure: LAPAROSCOPIC CHOLECYSTECTOMY;  Surgeon: Vickie Epley, MD;  Location: [facility];  Service: General;  Laterality: N/A;   CYSTOSCOPY W/ DILATION OF BLADDER  03/11/2022   RENAL BIOPSY  08/02/2021   WISDOM TOOTH EXTRACTION     Social History   Socioeconomic History   Marital status: Married    Spouse name: dustin   Number of children: 1   Years of education: 12   Highest education level: 10th grade  Occupational History   Occupation: homemaker  Tobacco Use   Smoking status: Never    Passive exposure: Never   Smokeless tobacco: Never  Vaping Use   Vaping Use: Never used  Substance and Sexual Activity   Alcohol use: No   Drug use: No   Sexual activity: Not Currently    Birth control/protection: None  Other Topics Concern   Not on file  Social History Narrative   Not on file   Social Determinants of Health   Financial Resource Strain: Low Risk  (11/05/2018)   Overall Financial Resource Strain (CARDIA)    Difficulty of Paying Living Expenses: Not very hard  Food Insecurity: Unknown (11/05/2018)   Hunger Vital Sign    Worried About Running Out of Food in the Last Year: Never true    Ran Out of Food in the Last Year: Not on file  Transportation Needs: No Transportation Needs (11/05/2018)   PRAPARE - Hydrologist (Medical): No    Lack of Transportation  (Non-Medical): No  Physical Activity: Unknown (11/05/2018)   Exercise Vital Sign    Days of Exercise per Week: 3 days    Minutes of Exercise per Session: Not on file  Stress: No Stress Concern Present (11/05/2018)   [place]    Feeling of Stress : Only a little  Social Connections: Moderately Integrated (11/05/2018)   Social Connection and Isolation Panel [NHANES]    Frequency of Communication with Friends and Family: More than three times a week    Frequency of Social Gatherings with Friends and Family: Twice a week    Attends Religious Services: More than 4 times per year    Active Member of [employer] or Organizations: No    Attends Archivist Meetings: Never    Marital Status: Married   Outpatient Encounter Medications as of 04/08/2022  Medication Sig   ARIPiprazole (ABILIFY) 15 MG tablet Take 15 mg by mouth daily.   Pediatric Multiple Vitamins (FLINSTONES GUMMIES OMEGA-3 DHA PO) Take 1 tablet by mouth daily.   pregabalin (LYRICA) 100 MG capsule Take 100 mg by mouth at bedtime.   propranolol (INDERAL) 10 MG tablet Take 10 mg by mouth 2 (two) times daily as needed.   tolterodine (  DETROL LA) 4 MG 24 hr capsule Take 4 mg by mouth daily.   celecoxib (CELEBREX) 100 MG capsule Take 100 mg by mouth 2 (two) times daily. (Patient not taking: Reported on 04/08/2022)   Cholecalciferol (CVS D3) 125 MCG (5000 UT) capsule TAKE 1 CAPSULE (5,000 UNITS TOTAL) BY MOUTH EVERY OTHER DAY FOR 90 DAYS   DULoxetine (CYMBALTA) 60 MG capsule Take 1 capsule by mouth daily. (Patient not taking: Reported on 04/08/2022)   ferrous sulfate 325 (65 FE) MG tablet TAKE 1 TABLET BY MOUTH EVERY DAY WITH BREAKFAST (Patient not taking: Reported on 04/08/2022)   magnesium oxide (MAG-OX) 400 MG tablet Take by mouth. (Patient not taking: Reported on 04/08/2022)   mycophenolate (CELLCEPT) 500 MG tablet Take 1,000 mg by mouth 2 (two) times daily. (Patient not  taking: Reported on 04/08/2022)   SYNTHROID 100 MCG tablet TAKE 1 TABLET BY MOUTH EVERY DAY BEFORE BREAKFAST   [DISCONTINUED] DULoxetine (CYMBALTA) 30 MG capsule Take 30 mg by mouth daily. (Patient not taking: Reported on 08/27/2021)   [DISCONTINUED] famotidine (PEPCID) 20 MG tablet Take 1 tablet (20 mg total) by mouth 2 (two) times daily.   No facility-administered encounter medications on file as of 04/08/2022.   ALLERGIES: Allergies  Allergen Reactions   Oxycodone Diarrhea   Prednisone Other (See Comments)    hallucinations   Versed [Midazolam] Hives    Hives after IV administration of zofran and versed. Unsure which medication caused reaction   Zofran [Ondansetron Hcl] Hives    Hives after IV adminstration of zofran and versed. Unsure of which medication caused reaction.     VACCINATION STATUS: Immunization History  Administered Date(s) Administered   Influenza Whole 02/14/2011   Influenza,inj,Quad PF,6+ Mos 03/18/2019   Tdap 03/18/2019    HPI 27 year old female patient with medical history as above.  She is being seen with repeat thyroid function tests consistent on send 100 mcg p.o. daily before breakfast.   Her hypothyroidism is confirmed to be due to Hashimoto's thyroiditis.  She also has history of vitamin D deficiency, and iron deficiency anemia. -She presents with 7 pounds of weight gain since last visit.  Her most recent thyroid function tests are consistent with appropriate replacement. Recently, she was diagnosed with CKD, following with the nephrology group. -he denies tremors, heat intolerance.  Palpitations are intermittent. Due to neck pain, she underwent MRI of the spine which incidentally showed multiple thyroid nodules.  She was to know more information about her thyroid anatomy.  - She reports that she was diagnosed with hypothyroidism at age 66 years, treated with various doses of levothyroxine over the years.   - She has family history of hypothyroidism in her  mother. She denies family history of thyroid cancer. She denies any personal history of goiter.   Review of Systems Limited as above.    Objective:    BP 100/78   Pulse 76   Ht 4\' 6"  (1.372 m)   Wt 197 lb 12.8 oz (89.7 kg)   BMI 47.69 kg/m   Wt Readings from Last 3 Encounters:  04/08/22 197 lb 12.8 oz (89.7 kg)  08/27/21 190 lb 6.4 oz (86.4 kg)  08/09/21 192 lb 3.2 oz (87.2 kg)    Physical Exam- Limited   CMP     Component Value Date/Time   NA 142 03/30/2022 1426   K 4.3 03/30/2022 1426   CL 106 03/30/2022 1426   CO2 23 03/30/2022 1426   GLUCOSE 85 03/30/2022 1426   GLUCOSE 86  05/12/2019 0850   BUN 19 03/30/2022 1426   CREATININE 1.64 (H) 03/30/2022 1426   CREATININE 0.56 07/23/2013 0815   CALCIUM 8.9 03/30/2022 1426   CALCIUM 8.9 07/19/2011 1547   PROT 7.2 03/30/2022 1426   ALBUMIN 3.8 (L) 03/30/2022 1426   AST 20 03/30/2022 1426   ALT 17 03/30/2022 1426   ALKPHOS 117 03/30/2022 1426   BILITOT 0.2 03/30/2022 1426   GFRNONAA >60 05/14/2019 1222   GFRAA >60 05/14/2019 1222     Diabetic Labs (most recent): Lab Results  Component Value Date   HGBA1C 5.0 02/04/2017   HGBA1C 5.1 07/16/2012   HGBA1C 5.4 10/25/2011     Lipid Panel ( most recent) Lipid Panel     Component Value Date/Time   CHOL 156 07/23/2013 0815   TRIG 131 07/23/2013 0815   HDL 42 07/23/2013 0815   CHOLHDL 3.7 07/23/2013 0815   VLDL 26 07/23/2013 0815   LDLCALC 88 07/23/2013 0815   Recent Results (from the past 2160 hour(s))  TSH     Status: None   Collection Time: 03/30/22  2:26 PM  Result Value Ref Range   TSH 2.340 0.450 - 4.500 uIU/mL  T4, Free     Status: None   Collection Time: 03/30/22  2:26 PM  Result Value Ref Range   Free T4 1.50 0.82 - 1.77 ng/dL  Comprehensive metabolic panel     Status: Abnormal   Collection Time: 03/30/22  2:26 PM  Result Value Ref Range   Glucose 85 70 - 99 mg/dL   BUN 19 6 - 20 mg/dL   Creatinine, Ser 1.64 (H) 0.57 - 1.00 mg/dL   eGFR 44 (L)  >59 mL/min/1.73   BUN/Creatinine Ratio 12 9 - 23   Sodium 142 134 - 144 mmol/L   Potassium 4.3 3.5 - 5.2 mmol/L   Chloride 106 96 - 106 mmol/L   CO2 23 20 - 29 mmol/L   Calcium 8.9 8.7 - 10.2 mg/dL   Total Protein 7.2 6.0 - 8.5 g/dL   Albumin 3.8 (L) 4.0 - 5.0 g/dL   Globulin, Total 3.4 1.5 - 4.5 g/dL   Albumin/Globulin Ratio 1.1 (L) 1.2 - 2.2   Bilirubin Total 0.2 0.0 - 1.2 mg/dL   Alkaline Phosphatase 117 44 - 121 IU/L   AST 20 0 - 40 IU/L   ALT 17 0 - 32 IU/L     Assessment & Plan:   1. Hypothyroidism due to Hashimoto's thyroiditis 2.  Multiple thyroid nodules 3.  Vitamin D deficiency -Her previsit thyroid function tests are consistent with appropriate replacement.  I discussed her lab results and advised her to continue Synthroid 100 mcg p.o. daily before breakfast.    - We discussed about the correct intake of her thyroid hormone, on empty stomach at fasting, with water, separated by at least 30 minutes from breakfast and other medications,  and separated by more than 4 hours from calcium, iron, multivitamins, acid reflux medications (PPIs). -Patient is made aware of the fact that thyroid hormone replacement is needed for life, dose to be adjusted by periodic monitoring of thyroid function tests.    -Her last CBC showed hemoglobin of 11.2, benefiting from iron supplement.  She is advised to continue ferrous sulfate 325 mg p.o. daily at breakfast.  -Her vitamin D has corrected at 66, this is improving from 17.  She is advised to continue maintenance by taking vitamin D3 5000 units every other day.  She is advised to continue her  close follow-up with nephrology.    In light of her recent MRI showing multiple thyroid nodules, she will need dedicated thyroid ultrasound to study the anatomy of her thyroid properly.  -She does not have diabetes/prediabetes, her recent A1c was 5%.    She is advised to continue close follow-up with her PCP [facility], PA-C.   I spent 21  minutes in the care of the patient today including review of labs from Thyroid Function, CMP, and other relevant labs ; imaging/biopsy records (current and previous including abstractions from other facilities); face-to-face time discussing  her lab results and symptoms, medications doses, her options of short and long term treatment based on the latest standards of care / guidelines;   and documenting the encounter.  Hailey Thompson  participated in the discussions, expressed understanding, and voiced agreement with the above plans.  All questions were answered to her satisfaction. she is encouraged to contact clinic should she have any questions or concerns prior to her return visit.    Follow up plan: Return in about 6 months (around 10/07/2022) for F/U with Pre-visit Labs, Thyroid / Neck Ultrasound.  Glade Lloyd, MD Phone: [phone]  Fax: [phone]  -  This note was partially dictated with voice recognition software. Similar sounding words can be transcribed inadequately or may not  be corrected upon review.  04/08/2022, 10:04 AM

## 2022-04-25 ENCOUNTER — Ambulatory Visit (HOSPITAL_COMMUNITY)
Admission: RE | Admit: 2022-04-25 | Discharge: 2022-04-25 | Disposition: A | Discharge status: Home / Self Care | Payer: Medicaid Other | Source: Ambulatory Visit | Attending: "Endocrinology | Admitting: "Endocrinology

## 2022-04-25 DIAGNOSIS — E042 Nontoxic multinodular goiter: Secondary | ICD-10-CM | POA: Diagnosis present

## 2022-05-06 DIAGNOSIS — G935 Compression of brain: Secondary | ICD-10-CM | POA: Insufficient documentation

## 2022-05-24 ENCOUNTER — Other Ambulatory Visit: Payer: Self-pay | Admitting: "Endocrinology

## 2022-07-06 ENCOUNTER — Other Ambulatory Visit: Payer: Self-pay

## 2022-07-06 ENCOUNTER — Encounter (HOSPITAL_BASED_OUTPATIENT_CLINIC_OR_DEPARTMENT_OTHER): Payer: Self-pay

## 2022-07-06 ENCOUNTER — Emergency Department (HOSPITAL_BASED_OUTPATIENT_CLINIC_OR_DEPARTMENT_OTHER)
Admission: EM | Admit: 2022-07-06 | Discharge: 2022-07-06 | Disposition: A | Discharge status: Home / Self Care | Payer: Medicaid Other | Attending: Emergency Medicine | Admitting: Emergency Medicine

## 2022-07-06 ENCOUNTER — Emergency Department (HOSPITAL_BASED_OUTPATIENT_CLINIC_OR_DEPARTMENT_OTHER): Payer: Medicaid Other | Admitting: Radiology

## 2022-07-06 DIAGNOSIS — D649 Anemia, unspecified: Secondary | ICD-10-CM | POA: Diagnosis not present

## 2022-07-06 DIAGNOSIS — D72829 Elevated white blood cell count, unspecified: Secondary | ICD-10-CM | POA: Insufficient documentation

## 2022-07-06 DIAGNOSIS — N189 Chronic kidney disease, unspecified: Secondary | ICD-10-CM | POA: Insufficient documentation

## 2022-07-06 DIAGNOSIS — T887XXA Unspecified adverse effect of drug or medicament, initial encounter: Secondary | ICD-10-CM | POA: Insufficient documentation

## 2022-07-06 DIAGNOSIS — R000 Tachycardia, unspecified: Secondary | ICD-10-CM | POA: Diagnosis present

## 2022-07-06 LAB — BASIC METABOLIC PANEL
Anion gap: 8 (ref 5–15)
BUN: 31 mg/dL — ABNORMAL HIGH (ref 6–20)
CO2: 22 mmol/L (ref 22–32)
Calcium: 9.2 mg/dL (ref 8.9–10.3)
Chloride: 107 mmol/L (ref 98–111)
Creatinine, Ser: 1.95 mg/dL — ABNORMAL HIGH (ref 0.44–1.00)
GFR, Estimated: 36 mL/min — ABNORMAL LOW (ref >60)
Glucose, Bld: 105 mg/dL — ABNORMAL HIGH (ref 70–99)
Potassium: 3.8 mmol/L (ref 3.5–5.1)
Sodium: 137 mmol/L (ref 135–145)

## 2022-07-06 LAB — CBC
HCT: 31.8 % — ABNORMAL LOW (ref 36.0–46.0)
Hemoglobin: 10.7 g/dL — ABNORMAL LOW (ref 12.0–15.0)
MCH: 30.4 pg (ref 26.0–34.0)
MCHC: 33.6 g/dL (ref 30.0–36.0)
MCV: 90.3 fL (ref 80.0–100.0)
Platelets: 308 10*3/uL (ref 150–400)
RBC: 3.52 MIL/uL — ABNORMAL LOW (ref 3.87–5.11)
RDW: 12.6 % (ref 11.5–15.5)
WBC: 12.5 10*3/uL — ABNORMAL HIGH (ref 4.0–10.5)
nRBC: 0 % (ref 0.0–0.2)

## 2022-07-06 LAB — TROPONIN I (HIGH SENSITIVITY): Troponin I (High Sensitivity): <2 ng/L (ref <18)

## 2022-07-06 LAB — HCG, SERUM, QUALITATIVE: Preg, Serum: NEGATIVE

## 2022-07-06 NOTE — ED Triage Notes (Signed)
Patient here POV from Home.  Endorses Tachycardia that began Friday. Intermittent in nature and worsens with Exertion.   Began Phentermine recently on Thursday. Last dose was Saturday and discontinued after she began to feel Tachycardia.   Some SOB. Some Chest Tightness. Some nausea. No emesis.   NAD Noted during Triage. A&Ox4. Gcs 15. Ambulatory.

## 2022-07-06 NOTE — ED Provider Notes (Signed)
[facility] EMERGENCY DEPARTMENT AT [facility]  Provider Note  CSN: [id number] Arrival date & time: 07/06/22 1947  History Chief Complaint  Patient presents with   Tachycardia    Hailey Thompson is a 27 y.o. female with history of CKD (unknown cause, has had workup including biopsy) and obesity was started on Phentermine about 10 days ago by bariatric doctor but she reported racing heart with that so she stopped it several days ago and was switched to diethylpriprion (another stimulant diet medication) but reports the tachycardia was worse with this med. She reports improved with rest but worse with walking around. She has been maintaining her hydration. Some mild chest tightness and SOB. No vomiting, no fever.     Home Medications Prior to Admission medications   Medication Sig Start Date End Date Taking? Authorizing Provider  ARIPiprazole (ABILIFY) 15 MG tablet Take 15 mg by mouth daily. 03/30/22   [provider]  celecoxib (CELEBREX) 100 MG capsule Take 100 mg by mouth 2 (two) times daily. Patient not taking: Reported on 04/08/2022    [provider]  Cholecalciferol (CVS D3) 125 MCG (5000 UT) capsule TAKE 1 CAPSULE (5,000 UNITS TOTAL) BY MOUTH EVERY OTHER DAY FOR 90 DAYS 03/30/22   Nida, Denman George, MD  DULoxetine (CYMBALTA) 60 MG capsule Take 1 capsule by mouth daily. Patient not taking: Reported on 04/08/2022 08/13/21   [provider]  ferrous sulfate 325 (65 FE) MG tablet TAKE 1 TABLET BY MOUTH EVERY DAY WITH BREAKFAST Patient not taking: Reported on 04/08/2022 08/30/21   Roma Kayser, MD  magnesium oxide (MAG-OX) 400 MG tablet Take by mouth. Patient not taking: Reported on 04/08/2022 07/01/21   [provider]  mycophenolate (CELLCEPT) 500 MG tablet Take 1,000 mg by mouth 2 (two) times daily. Patient not taking: Reported on 04/08/2022    [provider]  Pediatric Multiple Vitamins (FLINSTONES GUMMIES OMEGA-3 DHA PO) Take 1  tablet by mouth daily.    [provider]  pregabalin (LYRICA) 100 MG capsule Take 100 mg by mouth at bedtime. 03/29/22 04/28/22  [provider]  propranolol (INDERAL) 10 MG tablet Take 10 mg by mouth 2 (two) times daily as needed. 03/30/22   [provider]  SYNTHROID 100 MCG tablet TAKE 1 TABLET BY MOUTH EVERY DAY BEFORE BREAKFAST 05/25/22   Roma Kayser, MD  tolterodine (DETROL LA) 4 MG 24 hr capsule Take 4 mg by mouth daily. 03/11/22   [provider]  famotidine (PEPCID) 20 MG tablet Take 1 tablet (20 mg total) by mouth 2 (two) times daily. 06/11/14 06/11/14  Donnetta Hutching, MD     Allergies    Oxycodone, Prednisone, Versed [midazolam], and Zofran [ondansetron hcl]   Review of Systems   Review of Systems Please see HPI for pertinent positives and negatives  Physical Exam BP 127/78 (BP Location: Right Arm)   Pulse (!) 104   Temp 98.3 F (36.8 C) (Oral)   Resp 18   Ht 4\' 6"  (1.372 m)   Wt 91.2 kg   SpO2 100%   BMI 48.46 kg/m   Physical Exam Vitals and nursing note reviewed.  Constitutional:      Appearance: Normal appearance.  HENT:     Head: Normocephalic and atraumatic.     Nose: Nose normal.     Mouth/Throat:     Mouth: Mucous membranes are moist.  Eyes:     Extraocular Movements: Extraocular movements intact.     Conjunctiva/sclera: Conjunctivae  normal.  Cardiovascular:     Rate and Rhythm: Normal rate.  Pulmonary:     Effort: Pulmonary effort is normal.     Breath sounds: Normal breath sounds.  Abdominal:     General: Abdomen is flat.     Palpations: Abdomen is soft.     Tenderness: There is no abdominal tenderness.  Musculoskeletal:        General: No swelling. Normal range of motion.     Cervical back: Neck supple.  Skin:    General: Skin is warm and dry.  Neurological:     General: No focal deficit present.     Mental Status: She is alert.  Psychiatric:        Mood and Affect: Mood normal.     ED Results /  Procedures / Treatments   EKG EKG Interpretation  Date/Time:  Wednesday July 06 2022 19:54:33 EDT Ventricular Rate:  101 PR Interval:  120 QRS Duration: 82 QT Interval:  354 QTC Calculation: 459 R Axis:   50 Text Interpretation: Sinus tachycardia Nonspecific T wave abnormality Abnormal ECG When compared with ECG of 07-Sep-2013 20:01, PREVIOUS ECG IS PRESENT Rate faster Nonspecific T wave flattening Confirmed by Susy Frizzle [phone]) on 07/06/2022 11:11:54 PM  Procedures Procedures  Medications Ordered in the ED Medications - No data to display  Initial Impression and Plan  Patient here with tachycardia as a side effect of medications . Labs done in triage shows BMP with mild increase in Cr from prior baseline. CBC with mild leukocytosis and anemia also at baseline. Trop is normal. HCG is neg. I personally viewed the images from radiology studies and agree with radiologist interpretation: CXR is clear.   Patient without significant tachycardia while in the ED. Recommend she continue with oral hydration, stop all stimulant medications and discuss other weight loss options with her bariatric doctor. Also recommend close Nephro follow up for management of her CKD.    ED Course       MDM Rules/Calculators/A&P Medical Decision Making Problems Addressed: Chronic kidney disease, unspecified CKD stage: chronic illness or injury with exacerbation, progression, or side effects of treatment Tachycardia: acute illness or injury  Amount and/or Complexity of Data Reviewed Labs: ordered. Decision-making details documented in ED Course. Radiology: ordered and independent interpretation performed. Decision-making details documented in ED Course. ECG/medicine tests: ordered and independent interpretation performed. Decision-making details documented in ED Course.     Final Clinical Impression(s) / ED Diagnoses Final diagnoses:  Tachycardia  Chronic kidney disease, unspecified CKD stage   Side effect of medication    Rx / DC Orders ED Discharge Orders     None        Pollyann Savoy, MD 07/06/22 2329

## 2022-07-07 NOTE — Progress Notes (Deleted)
Referring:  Hailey Pagan, NP [address] [place],  [place] [address]  PCP: Hailey Pagan, NP  Neurology was asked to evaluate Halina Lawhorne, a 27 year old female for a chief complaint of headaches.  Our recommendations of care will be communicated by shared medical record.    CC:  headaches  History provided from ***  HPI:  Medical co-morbidities: hypothyroidism, iron deficiency, fibromyalgia, CKD 3  The patient presents for evaluation of headaches which began***  Headache History: Onset: Triggers: Aura: Location: Quality/Description: Associated Symptoms:  Photophobia:  Phonophobia:  Nausea: Vomiting: Allodynia: Other symptoms: Worse with activity?: Duration of headaches:  Headache days per month: *** Migraine days per month: *** Headache free days per month: ***  Current Treatment: Abortive ***  Preventative ***  Prior Therapies                                 Cymbalta Zofran - hives  LABS: ***  IMAGING:  ***  ***Imaging independently reviewed on July 07, 2022   Current Outpatient Medications on File Prior to Visit  Medication Sig Dispense Refill   ARIPiprazole (ABILIFY) 15 MG tablet Take 15 mg by mouth daily.     celecoxib (CELEBREX) 100 MG capsule Take 100 mg by mouth 2 (two) times daily. (Patient not taking: Reported on 04/08/2022)     Cholecalciferol (CVS D3) 125 MCG (5000 UT) capsule TAKE 1 CAPSULE (5,000 UNITS TOTAL) BY MOUTH EVERY OTHER DAY FOR 90 DAYS 100 capsule 0   DULoxetine (CYMBALTA) 60 MG capsule Take 1 capsule by mouth daily. (Patient not taking: Reported on 04/08/2022)     ferrous sulfate 325 (65 FE) MG tablet TAKE 1 TABLET BY MOUTH EVERY DAY WITH BREAKFAST (Patient not taking: Reported on 04/08/2022) 90 tablet 1   magnesium oxide (MAG-OX) 400 MG tablet Take by mouth. (Patient not taking: Reported on 04/08/2022)     mycophenolate (CELLCEPT) 500 MG tablet Take 1,000 mg by mouth 2 (two) times daily. (Patient  not taking: Reported on 04/08/2022)     Pediatric Multiple Vitamins (FLINSTONES GUMMIES OMEGA-3 DHA PO) Take 1 tablet by mouth daily.     pregabalin (LYRICA) 100 MG capsule Take 100 mg by mouth at bedtime.     propranolol (INDERAL) 10 MG tablet Take 10 mg by mouth 2 (two) times daily as needed.     SYNTHROID 100 MCG tablet TAKE 1 TABLET BY MOUTH EVERY DAY BEFORE BREAKFAST 90 tablet 0   tolterodine (DETROL LA) 4 MG 24 hr capsule Take 4 mg by mouth daily.     [DISCONTINUED] famotidine (PEPCID) 20 MG tablet Take 1 tablet (20 mg total) by mouth 2 (two) times daily. 20 tablet 0   No current facility-administered medications on file prior to visit.     Allergies: Allergies  Allergen Reactions   Oxycodone Diarrhea   Prednisone Other (See Comments)    hallucinations   Versed [Midazolam] Hives    Hives after IV administration of zofran and versed. Unsure which medication caused reaction   Zofran [Ondansetron Hcl] Hives    Hives after IV adminstration of zofran and versed. Unsure of which medication caused reaction.     Family History: Migraine or other headaches in the family:  *** Aneurysms in a first degree relative:  *** Brain tumors in the family:  *** Other neurological illness in the family:   ***  Past Medical History: Past Medical  History:  Diagnosis Date   Hypothyroidism    Migraine    Thyroid disease    UTI (lower urinary tract infection)     Past Surgical History Past Surgical History:  Procedure Laterality Date   APPENDECTOMY     as a child   CESAREAN SECTION  2016   CESAREAN SECTION N/A 05/14/2019   Procedure: CESAREAN SECTION;  Surgeon: Tilda Burrow, MD;  Location: [facility];  Service: Obstetrics;  Laterality: N/A;   CHOLECYSTECTOMY N/A 11/06/2015   Procedure: LAPAROSCOPIC CHOLECYSTECTOMY;  Surgeon: Ancil Linsey, MD;  Location: [facility];  Service: General;  Laterality: N/A;   CYSTOSCOPY W/ DILATION OF BLADDER  03/11/2022   RENAL BIOPSY  08/02/2021   WISDOM  TOOTH EXTRACTION      Social History: Social History   Tobacco Use   Smoking status: Never    Passive exposure: Never   Smokeless tobacco: Never  Vaping Use   Vaping Use: Never used  Substance Use Topics   Alcohol use: No   Drug use: No   ***  ROS: Negative for fevers, chills. Positive for***. All other systems reviewed and negative unless stated otherwise in HPI.   Physical Exam:   Vital Signs: There were no vitals taken for this visit. GENERAL: well appearing,in no acute distress,alert SKIN:  Color, texture, turgor normal. No rashes or lesions HEAD:  Normocephalic/atraumatic. CV:  RRR RESP: Normal respiratory effort MSK: no tenderness to palpation over occiput, neck, or shoulders  NEUROLOGICAL: Mental Status: Alert, oriented to person, place and time,Follows commands Cranial Nerves: PERRL, visual fields intact to confrontation, extraocular movements intact, facial sensation intact, no facial droop or ptosis, hearing grossly intact, no dysarthria, palate elevate symmetrically, tongue protrudes midline, shoulder shrug intact and symmetric Motor: muscle strength 5/5 both upper and lower extremities,no drift, normal tone Reflexes: 2+ throughout Sensation: intact to light touch all 4 extremities Coordination: Finger-to- nose-finger intact bilaterally Gait: normal-based   IMPRESSION: ***  PLAN: ***   I spent a total of *** minutes chart reviewing and counseling the patient. Headache education was done. Discussed treatment options including preventive and acute medications, natural supplements, and physical therapy. Discussed medication overuse headache and to limit use of acute treatments to no more than 2 days/week or 10 days/month. Discussed medication side effects, adverse reactions and drug interactions. Written educational materials and patient instructions outlining all of the above were given.  Follow-up: ***   Ocie Doyne, MD 07/07/2022   10:36 AM

## 2022-07-08 ENCOUNTER — Ambulatory Visit: Payer: Medicaid Other | Admitting: Psychiatry

## 2022-07-11 ENCOUNTER — Other Ambulatory Visit (HOSPITAL_COMMUNITY): Payer: Self-pay

## 2022-07-11 DIAGNOSIS — M797 Fibromyalgia: Secondary | ICD-10-CM | POA: Insufficient documentation

## 2022-07-12 ENCOUNTER — Other Ambulatory Visit (HOSPITAL_COMMUNITY): Payer: Self-pay

## 2022-07-12 MED ORDER — CAPLYTA 42 MG PO CAPS
42.0000 mg | ORAL_CAPSULE | Freq: Every day | ORAL | 0 refills | Status: DC
  Filled 2022-07-12: qty 30, 30d supply, fill #0

## 2022-07-14 ENCOUNTER — Other Ambulatory Visit (HOSPITAL_COMMUNITY): Payer: Self-pay

## 2022-07-19 DIAGNOSIS — D472 Monoclonal gammopathy: Secondary | ICD-10-CM | POA: Insufficient documentation

## 2022-07-20 DIAGNOSIS — N10 Acute tubulo-interstitial nephritis: Secondary | ICD-10-CM | POA: Insufficient documentation

## 2022-07-26 ENCOUNTER — Other Ambulatory Visit (HOSPITAL_COMMUNITY): Payer: Self-pay

## 2022-07-26 MED ORDER — CAPLYTA 42 MG PO CAPS
42.0000 mg | ORAL_CAPSULE | Freq: Every day | ORAL | 0 refills | Status: DC
  Filled 2022-07-26: qty 30, 30d supply, fill #0

## 2022-07-27 ENCOUNTER — Other Ambulatory Visit (HOSPITAL_COMMUNITY): Payer: Self-pay

## 2022-07-27 MED ORDER — CAPLYTA 42 MG PO CAPS
42.0000 mg | ORAL_CAPSULE | Freq: Every day | ORAL | 1 refills | Status: DC
  Filled 2022-07-27 – 2022-07-30 (×2): qty 30, 30d supply, fill #0

## 2022-07-28 ENCOUNTER — Other Ambulatory Visit (HOSPITAL_COMMUNITY): Payer: Self-pay

## 2022-07-28 ENCOUNTER — Emergency Department (HOSPITAL_BASED_OUTPATIENT_CLINIC_OR_DEPARTMENT_OTHER): Payer: Medicaid Other

## 2022-07-28 ENCOUNTER — Emergency Department (HOSPITAL_BASED_OUTPATIENT_CLINIC_OR_DEPARTMENT_OTHER)
Admission: EM | Admit: 2022-07-28 | Discharge: 2022-07-28 | Disposition: A | Discharge status: Home / Self Care | Payer: Medicaid Other | Attending: Emergency Medicine | Admitting: Emergency Medicine

## 2022-07-28 ENCOUNTER — Other Ambulatory Visit: Payer: Self-pay

## 2022-07-28 DIAGNOSIS — R1033 Periumbilical pain: Secondary | ICD-10-CM | POA: Diagnosis not present

## 2022-07-28 DIAGNOSIS — E039 Hypothyroidism, unspecified: Secondary | ICD-10-CM | POA: Insufficient documentation

## 2022-07-28 DIAGNOSIS — R1013 Epigastric pain: Secondary | ICD-10-CM | POA: Diagnosis not present

## 2022-07-28 DIAGNOSIS — R1010 Upper abdominal pain, unspecified: Secondary | ICD-10-CM | POA: Diagnosis present

## 2022-07-28 LAB — COMPREHENSIVE METABOLIC PANEL
ALT: 22 U/L (ref 0–44)
AST: 24 U/L (ref 15–41)
Albumin: 3.7 g/dL (ref 3.5–5.0)
Alkaline Phosphatase: 93 U/L (ref 38–126)
Anion gap: 8 (ref 5–15)
BUN: 19 mg/dL (ref 6–20)
CO2: 26 mmol/L (ref 22–32)
Calcium: 8.1 mg/dL — ABNORMAL LOW (ref 8.9–10.3)
Chloride: 105 mmol/L (ref 98–111)
Creatinine, Ser: 1.74 mg/dL — ABNORMAL HIGH (ref 0.44–1.00)
GFR, Estimated: 41 mL/min — ABNORMAL LOW (ref >60)
Glucose, Bld: 90 mg/dL (ref 70–99)
Potassium: 3.9 mmol/L (ref 3.5–5.1)
Sodium: 139 mmol/L (ref 135–145)
Total Bilirubin: 0.2 mg/dL — ABNORMAL LOW (ref 0.3–1.2)
Total Protein: 7.3 g/dL (ref 6.5–8.1)

## 2022-07-28 LAB — CBC
HCT: 32.1 % — ABNORMAL LOW (ref 36.0–46.0)
Hemoglobin: 10.2 g/dL — ABNORMAL LOW (ref 12.0–15.0)
MCH: 30.3 pg (ref 26.0–34.0)
MCHC: 31.8 g/dL (ref 30.0–36.0)
MCV: 95.3 fL (ref 80.0–100.0)
Platelets: 238 10*3/uL (ref 150–400)
RBC: 3.37 MIL/uL — ABNORMAL LOW (ref 3.87–5.11)
RDW: 13.2 % (ref 11.5–15.5)
WBC: 11.7 10*3/uL — ABNORMAL HIGH (ref 4.0–10.5)
nRBC: 0 % (ref 0.0–0.2)

## 2022-07-28 LAB — PREGNANCY, URINE: Preg Test, Ur: NEGATIVE

## 2022-07-28 LAB — URINALYSIS, ROUTINE W REFLEX MICROSCOPIC
Bilirubin Urine: NEGATIVE
Glucose, UA: NEGATIVE mg/dL
Hgb urine dipstick: NEGATIVE
Ketones, ur: NEGATIVE mg/dL
Leukocytes,Ua: NEGATIVE
Nitrite: NEGATIVE
Protein, ur: NEGATIVE mg/dL
Specific Gravity, Urine: 1.017 (ref 1.005–1.030)
pH: 7 (ref 5.0–8.0)

## 2022-07-28 LAB — LIPASE, BLOOD: Lipase: 102 U/L — ABNORMAL HIGH (ref 11–51)

## 2022-07-28 MED ORDER — PROMETHAZINE HCL 25 MG/ML IJ SOLN
INTRAMUSCULAR | Status: AC
  Filled 2022-07-28: qty 1

## 2022-07-28 MED ORDER — SODIUM CHLORIDE 0.9 % IV SOLN
25.0000 mg | Freq: Four times a day (QID) | INTRAVENOUS | Status: DC | PRN
  Administered 2022-07-28: 25 mg via INTRAVENOUS
  Filled 2022-07-28: qty 1

## 2022-07-28 MED ORDER — PROMETHAZINE HCL 25 MG PO TABS: 25.0000 mg | ORAL_TABLET | Freq: Four times a day (QID) | ORAL | 0 refills | Status: AC | PRN

## 2022-07-28 MED ORDER — FENTANYL CITRATE PF 50 MCG/ML IJ SOSY
25.0000 ug | PREFILLED_SYRINGE | Freq: Once | INTRAMUSCULAR | Status: AC
  Administered 2022-07-28: 25 ug via INTRAVENOUS
  Filled 2022-07-28: qty 1

## 2022-07-28 MED ORDER — IOHEXOL 300 MG/ML  SOLN
100.0000 mL | Freq: Once | INTRAMUSCULAR | Status: AC | PRN
  Administered 2022-07-28: 75 mL via INTRAVENOUS

## 2022-07-28 MED ORDER — OXYCODONE-ACETAMINOPHEN 5-325 MG PO TABS: 1.0000 | ORAL_TABLET | Freq: Four times a day (QID) | ORAL | 0 refills | Status: AC | PRN

## 2022-07-28 MED ORDER — MORPHINE SULFATE (PF) 4 MG/ML IV SOLN
4.0000 mg | Freq: Once | INTRAVENOUS | Status: AC
  Administered 2022-07-28: 4 mg via INTRAVENOUS
  Filled 2022-07-28: qty 1

## 2022-07-28 NOTE — Discharge Instructions (Addendum)
Please take your medications as prescribed. Take tylenol/ibuprofen or Percocet as needed for pain. I recommend close follow-up with PCP for reevaluation.  Please do not hesitate to return to emergency department if worrisome signs symptoms we discussed become apparent.  

## 2022-07-28 NOTE — ED Notes (Signed)
Attempt IV placement in [facility] without success

## 2022-07-28 NOTE — ED Provider Notes (Signed)
[place] EMERGENCY DEPARTMENT AT [facility] Provider Note   CSN: [id number] Arrival date & time: 07/28/22  1856     History  Chief Complaint  Patient presents with   Abdominal Pain    Hailey Thompson is a 27 y.o. female with a past medical history of hypothyroidism, migraine, thyroid disease presents today for evaluation of abdominal pain.  Patient had an endoscopy done yesterday, states she has had abdominal pain since.  Pain is located in her upper abdomen, constant, sharp, radiating to her back.  She endorses nausea and vomiting.  Denies any blood in her emesis.  Denies any urinary symptoms, bowel changes, blood in her stool or urine. Denies chest pain, shortness of breath.   Abdominal Pain     Past Medical History:  Diagnosis Date   Hypothyroidism    Migraine    Thyroid disease    UTI (lower urinary tract infection)    Past Surgical History:  Procedure Laterality Date   APPENDECTOMY     as a child   CESAREAN SECTION  2016   CESAREAN SECTION N/A 05/14/2019   Procedure: CESAREAN SECTION;  Surgeon: Tilda Burrow, MD;  Location: [facility] LD ORS;  Service: Obstetrics;  Laterality: N/A;   CHOLECYSTECTOMY N/A 11/06/2015   Procedure: LAPAROSCOPIC CHOLECYSTECTOMY;  Surgeon: Ancil Linsey, MD;  Location: [facility] ORS;  Service: General;  Laterality: N/A;   CYSTOSCOPY W/ DILATION OF BLADDER  03/11/2022   RENAL BIOPSY  08/02/2021   WISDOM TOOTH EXTRACTION        Home Medications Prior to Admission medications   Medication Sig Start Date End Date Taking? Authorizing Provider  oxyCODONE-acetaminophen (PERCOCET/ROXICET) 5-325 MG tablet Take 1 tablet by mouth every 6 (six) hours as needed for up to 8 doses for severe pain. 07/28/22  Yes Jeanelle Malling, PA  promethazine (PHENERGAN) 25 MG tablet Take 1 tablet (25 mg total) by mouth every 6 (six) hours as needed for nausea or vomiting. 07/28/22  Yes Jeanelle Malling, PA  ARIPiprazole (ABILIFY) 15 MG tablet Take 15 mg by mouth daily. 03/30/22    [provider]  celecoxib (CELEBREX) 100 MG capsule Take 100 mg by mouth 2 (two) times daily. Patient not taking: Reported on 04/08/2022    [provider]  Cholecalciferol ([employer] D3) 125 MCG (5000 UT) capsule TAKE 1 CAPSULE (5,000 UNITS TOTAL) BY MOUTH EVERY OTHER DAY FOR 90 DAYS 03/30/22   Nida, Denman George, MD  DULoxetine (CYMBALTA) 60 MG capsule Take 1 capsule by mouth daily. Patient not taking: Reported on 04/08/2022 08/13/21   [provider]  ferrous sulfate 325 (65 FE) MG tablet TAKE 1 TABLET BY MOUTH EVERY DAY WITH BREAKFAST Patient not taking: Reported on 04/08/2022 08/30/21   Roma Kayser, MD  lumateperone tosylate (CAPLYTA) 42 MG capsule Take one capsule by mouth at bedtime. 07/11/22     lumateperone tosylate (CAPLYTA) 42 MG capsule Take 1 capsule (42 mg total) by mouth at bedtime. 07/26/22     lumateperone tosylate (CAPLYTA) 42 MG capsule Take 1 capsule (42 mg total) by mouth at bedtime. 07/27/22     magnesium oxide (MAG-OX) 400 MG tablet Take by mouth. Patient not taking: Reported on 04/08/2022 07/01/21   [provider]  mycophenolate (CELLCEPT) 500 MG tablet Take 1,000 mg by mouth 2 (two) times daily. Patient not taking: Reported on 04/08/2022    [provider]  Pediatric Multiple Vitamins (FLINSTONES GUMMIES OMEGA-3 DHA PO) Take 1 tablet by mouth daily.    [provider]  pregabalin (LYRICA) 100 MG capsule Take 100 mg by mouth at bedtime. 03/29/22 04/28/22  [provider]  propranolol (INDERAL) 10 MG tablet Take 10 mg by mouth 2 (two) times daily as needed. 03/30/22   [provider]  SYNTHROID 100 MCG tablet TAKE 1 TABLET BY MOUTH EVERY DAY BEFORE BREAKFAST 05/25/22   Roma Kayser, MD  tolterodine (DETROL LA) 4 MG 24 hr capsule Take 4 mg by mouth daily. 03/11/22   [provider]  famotidine (PEPCID) 20 MG tablet Take 1 tablet (20 mg total) by mouth 2 (two) times daily. 06/11/14 06/11/14  Donnetta Hutching, MD      Allergies    Oxycodone, Prednisone, Versed [midazolam], and Zofran [ondansetron hcl]    Review of Systems   Review of Systems  Gastrointestinal:  Positive for abdominal pain.    Physical Exam Updated Vital Signs BP (!) 98/54   Pulse 77   Temp 98.6 F (37 C) (Oral)   Resp (!) 24   LMP 06/30/2022 (Approximate)   SpO2 100%  Physical Exam Vitals and nursing note reviewed.  Constitutional:      Appearance: Normal appearance.  HENT:     Head: Normocephalic and atraumatic.     Mouth/Throat:     Mouth: Mucous membranes are moist.  Eyes:     General: No scleral icterus. Cardiovascular:     Rate and Rhythm: Normal rate and regular rhythm.     Pulses: Normal pulses.     Heart sounds: Normal heart sounds.  Pulmonary:     Effort: Pulmonary effort is normal.     Breath sounds: Normal breath sounds.  Abdominal:     General: Abdomen is flat.     Palpations: Abdomen is soft.     Tenderness: There is abdominal tenderness in the epigastric area and periumbilical area.  Musculoskeletal:        General: No deformity.  Skin:    General: Skin is warm.     Findings: No rash.  Neurological:     General: No focal deficit present.     Mental Status: She is alert.  Psychiatric:        Mood and Affect: Mood normal.     ED Results / Procedures / Treatments   Labs (all labs ordered are listed, but only abnormal results are displayed) Labs Reviewed  LIPASE, BLOOD - Abnormal; Notable for the following components:      Result Value   Lipase 102 (*)    All other components within normal limits  COMPREHENSIVE METABOLIC PANEL - Abnormal; Notable for the following components:   Creatinine, Ser 1.74 (*)    Calcium 8.1 (*)    Total Bilirubin 0.2 (*)    GFR, Estimated 41 (*)    All other components within normal limits  CBC - Abnormal; Notable for the following components:   WBC 11.7 (*)    RBC 3.37 (*)    Hemoglobin 10.2 (*)    HCT 32.1 (*)    All other components  within normal limits  URINALYSIS, ROUTINE W REFLEX MICROSCOPIC  PREGNANCY, URINE    EKG EKG Interpretation  Date/Time:  Thursday Jul 28 2022 19:55:04 EDT Ventricular Rate:  83 PR Interval:  119 QRS Duration: 92 QT Interval:  358 QTC Calculation: 421 R Axis:   44 Text Interpretation: Sinus rhythm Borderline short PR interval when compared to prior, similar appearance. No STEMI Confirmed by Theda Belfast ([id number]) on 07/28/2022 9:25:22 PM  Radiology CT  ABDOMEN PELVIS W CONTRAST  Result Date: 07/28/2022 CLINICAL DATA:  Epigastric pain.  Post endoscopy. EXAM: CT ABDOMEN AND PELVIS WITH CONTRAST TECHNIQUE: Multidetector CT imaging of the abdomen and pelvis was performed using the standard protocol following bolus administration of intravenous contrast. RADIATION DOSE REDUCTION: This exam was performed according to the departmental dose-optimization program which includes automated exposure control, adjustment of the mA and/or kV according to patient size and/or use of iterative reconstruction technique. CONTRAST:  75mL OMNIPAQUE IOHEXOL 300 MG/ML  SOLN COMPARISON:  CT abdomen pelvis dated 10/22/2015. FINDINGS: Lower chest: The visualized lung bases are clear. No intra-abdominal free air or free fluid. Hepatobiliary: The liver is unremarkable. No biliary dilatation. Cholecystectomy. No retained calcified stone noted in the central CBD. Pancreas: Unremarkable. No pancreatic ductal dilatation or surrounding inflammatory changes. Spleen: Normal in size without focal abnormality. Adrenals/Urinary Tract: The adrenal glands are unremarkable. There is no hydronephrosis on either side. The visualized ureters and urinary bladder appear unremarkable. Stomach/Bowel: There is no bowel obstruction or active inflammation. Appendectomy. Vascular/Lymphatic: The abdominal aorta and IVC are unremarkable. There is a retroaortic left renal vein anatomy. No portal venous gas. There is no adenopathy. Reproductive: The uterus  is anteverted. Small right ovarian corpus luteum. No imaging follow-up. The left ovary is unremarkable. Other: None Musculoskeletal: No acute or significant osseous findings. IMPRESSION: No acute intra-abdominal or pelvic pathology. Electronically Signed   By: Elgie Collard M.D.   On: 07/28/2022 21:20    Procedures Procedures    Medications Ordered in ED Medications  morphine (PF) 4 MG/ML injection 4 mg (4 mg Intravenous Given 07/28/22 2054)  iohexol (OMNIPAQUE) 300 MG/ML solution 100 mL (75 mLs Intravenous Contrast Given 07/28/22 2100)  fentaNYL (SUBLIMAZE) injection 25 mcg (25 mcg Intravenous Given 07/28/22 2139)    ED Course/ Medical Decision Making/ A&P                             Medical Decision Making Amount and/or Complexity of Data Reviewed Labs: ordered. Radiology: ordered.  Risk Prescription drug management.   This patient presents to the ED for abdominal pain, vomiting, this involves an extensive number of treatment options, and is a complaint that carries with a high risk of complications and morbidity.  The differential diagnosis includes SBO, gastritis, gastroenteritis, GERD, perforation, appendicitis, ovarian torsion, ectopic pregnancy, infectious etiology.  This is not an exhaustive list.  Lab tests: I ordered and personally interpreted labs.  The pertinent results include: WBC 11.7. Hbg unremarkable. Platelets unremarkable. Electrolytes unremarkable. BUN, creatinine unremarkable.  Lipase 102.  Urine pregnancy test negative.  Urinalysis unremarkable.  Imaging studies: I ordered imaging studies. I personally reviewed, interpreted imaging and agree with the radiologist's interpretations. The results include: CT abdomen pelvis showed no acute intra-abdominal or pelvic abnormalities.  Problem list/ ED course/ Critical interventions/ Medical management: HPI: See above Vital signs within normal range and stable throughout visit. Laboratory/imaging studies significant  for: See above. On physical examination, patient is afebrile and appears in no acute distress. Presentation consistent with acute epigastric abdominal pain likely secondary to gastritis/GERD. Abdominal exam without peritoneal signs. No evidence of acute abdomen at this time. Well appearing. Given work up have low suspicion for acute hepatobiliary disease (including acute cholecystitis or cholangitis), upper GI bleed, gastric perforation, acute infectious processes (pneumonia, hepatitis, pyelonephritis), atypical appendicitis, vascular catastrophe, bowel obstruction or viscus perforation, or acute coronary syndrome.  Low suspicion for ectopic pregnancy with negative pregnancy test.  EKG with no evidence of acute ischemic changes.  Low suspicions for ACS/MI.  Presentation not consistent with other acute, emergent causes of abdominal pain at this time.  Given morphine and fentanyl and Phenergan.  Reevaluation of patient after this medication showed that the patient improved.  I sent an Rx of Phenergan and Percocet for pain as needed.  Advised patient to follow-up with her primary care physician and GI for further evaluation and management.  Strict return precaution discussed.  I have reviewed the patient home medicines and have made adjustments as needed.  Cardiac monitoring/EKG: The patient was maintained on a cardiac monitor.  I personally reviewed and interpreted the cardiac monitor which showed an underlying rhythm of: sinus rhythm.  Additional history obtained: External records from outside source obtained and reviewed including: Chart review including previous notes, labs, imaging.  Consultations obtained:  Disposition Continued outpatient therapy. Follow-up with PCP and gastroenterology recommended for reevaluation of symptoms. Treatment plan discussed with patient.  Pt acknowledged understanding was agreeable to the plan. Worrisome signs and symptoms were discussed with patient, and patient  acknowledged understanding to return to the ED if they noticed these signs and symptoms. Patient was stable upon discharge.   This chart was dictated using voice recognition software.  Despite best efforts to proofread,  errors can occur which can change the documentation meaning.          Final Clinical Impression(s) / ED Diagnoses Final diagnoses:  Epigastric pain    Rx / DC Orders ED Discharge Orders          Ordered    promethazine (PHENERGAN) 25 MG tablet  Every 6 hours PRN        07/28/22 2301    oxyCODONE-acetaminophen (PERCOCET/ROXICET) 5-325 MG tablet  Every 6 hours PRN        07/28/22 2301              Jeanelle Malling, PA 07/29/22 2113    Tegeler, Canary Brim, MD 08/01/22 [phone]

## 2022-07-28 NOTE — ED Triage Notes (Addendum)
Pt had an upper endoscopy yesterday and has been having nausea and indigestion since. Pt was seen at [facility] last pm and had blood work but LWBS due to wait.

## 2022-07-30 ENCOUNTER — Other Ambulatory Visit (HOSPITAL_COMMUNITY): Payer: Self-pay

## 2022-08-09 ENCOUNTER — Other Ambulatory Visit (HOSPITAL_COMMUNITY): Payer: Self-pay

## 2022-08-29 ENCOUNTER — Other Ambulatory Visit: Payer: Self-pay | Admitting: "Endocrinology

## 2022-08-30 MED ORDER — SYNTHROID 100 MCG PO TABS: 100.0000 ug | ORAL_TABLET | Freq: Every day | ORAL | 0 refills | Status: DC

## 2022-09-22 ENCOUNTER — Other Ambulatory Visit: Payer: Self-pay

## 2022-09-22 ENCOUNTER — Emergency Department (HOSPITAL_BASED_OUTPATIENT_CLINIC_OR_DEPARTMENT_OTHER)
Admission: EM | Admit: 2022-09-22 | Discharge: 2022-09-22 | Disposition: A | Discharge status: Home / Self Care | Payer: Medicaid Other | Attending: Emergency Medicine | Admitting: Emergency Medicine

## 2022-09-22 ENCOUNTER — Encounter (HOSPITAL_BASED_OUTPATIENT_CLINIC_OR_DEPARTMENT_OTHER): Payer: Self-pay

## 2022-09-22 DIAGNOSIS — E875 Hyperkalemia: Secondary | ICD-10-CM | POA: Insufficient documentation

## 2022-09-22 DIAGNOSIS — R5383 Other fatigue: Secondary | ICD-10-CM | POA: Insufficient documentation

## 2022-09-22 DIAGNOSIS — N189 Chronic kidney disease, unspecified: Secondary | ICD-10-CM | POA: Diagnosis not present

## 2022-09-22 DIAGNOSIS — R110 Nausea: Secondary | ICD-10-CM | POA: Diagnosis not present

## 2022-09-22 DIAGNOSIS — E039 Hypothyroidism, unspecified: Secondary | ICD-10-CM | POA: Insufficient documentation

## 2022-09-22 DIAGNOSIS — R5381 Other malaise: Secondary | ICD-10-CM | POA: Insufficient documentation

## 2022-09-22 HISTORY — DX: Fibromyalgia: M79.7

## 2022-09-22 HISTORY — DX: Chronic kidney disease, stage 3 unspecified: N18.30

## 2022-09-22 LAB — DIFFERENTIAL
Abs Immature Granulocytes: 0.06 10*3/uL (ref 0.00–0.07)
Basophils Absolute: 0.1 10*3/uL (ref 0.0–0.1)
Basophils Relative: 0 %
Eosinophils Absolute: 0.4 10*3/uL (ref 0.0–0.5)
Eosinophils Relative: 3 %
Immature Granulocytes: 0 %
Lymphocytes Relative: 17 %
Lymphs Abs: 2.4 10*3/uL (ref 0.7–4.0)
Monocytes Absolute: 0.8 10*3/uL (ref 0.1–1.0)
Monocytes Relative: 6 %
Neutro Abs: 10.3 10*3/uL — ABNORMAL HIGH (ref 1.7–7.7)
Neutrophils Relative %: 74 %

## 2022-09-22 LAB — COMPREHENSIVE METABOLIC PANEL
ALT: 18 U/L (ref 0–44)
AST: 27 U/L (ref 15–41)
Albumin: 3.9 g/dL (ref 3.5–5.0)
Alkaline Phosphatase: 125 U/L (ref 38–126)
Anion gap: 9 (ref 5–15)
BUN: 26 mg/dL — ABNORMAL HIGH (ref 6–20)
CO2: 22 mmol/L (ref 22–32)
Calcium: 9.2 mg/dL (ref 8.9–10.3)
Chloride: 107 mmol/L (ref 98–111)
Creatinine, Ser: 2.3 mg/dL — ABNORMAL HIGH (ref 0.44–1.00)
GFR, Estimated: 29 mL/min — ABNORMAL LOW (ref >60)
Glucose, Bld: 87 mg/dL (ref 70–99)
Potassium: 5.5 mmol/L — ABNORMAL HIGH (ref 3.5–5.1)
Sodium: 138 mmol/L (ref 135–145)
Total Bilirubin: 0.4 mg/dL (ref 0.3–1.2)
Total Protein: 8 g/dL (ref 6.5–8.1)

## 2022-09-22 LAB — URINALYSIS, ROUTINE W REFLEX MICROSCOPIC
Glucose, UA: NEGATIVE mg/dL
Ketones, ur: NEGATIVE mg/dL
Nitrite: NEGATIVE
Protein, ur: 30 mg/dL — AB
Specific Gravity, Urine: 1.025 (ref 1.005–1.030)
pH: 6 (ref 5.0–8.0)

## 2022-09-22 LAB — CBC
HCT: 31.9 % — ABNORMAL LOW (ref 36.0–46.0)
Hemoglobin: 10.5 g/dL — ABNORMAL LOW (ref 12.0–15.0)
MCH: 30.2 pg (ref 26.0–34.0)
MCHC: 32.9 g/dL (ref 30.0–36.0)
MCV: 91.7 fL (ref 80.0–100.0)
Platelets: 278 10*3/uL (ref 150–400)
RBC: 3.48 MIL/uL — ABNORMAL LOW (ref 3.87–5.11)
RDW: 12.6 % (ref 11.5–15.5)
WBC: 13.9 10*3/uL — ABNORMAL HIGH (ref 4.0–10.5)
nRBC: 0 % (ref 0.0–0.2)

## 2022-09-22 LAB — URINALYSIS, MICROSCOPIC (REFLEX)

## 2022-09-22 LAB — PREGNANCY, URINE: Preg Test, Ur: NEGATIVE

## 2022-09-22 NOTE — ED Provider Notes (Signed)
[facility] Emergency Department Provider Note MRN:  [id number]  Arrival date & time: 09/22/22     Chief Complaint   Nausea   History of Present Illness   Hailey Thompson is a 27 y.o. year-old female with a history of kidney disease, hypothyroidism presenting to the ED with chief complaint of nausea.  Fatigue, malaise, decreased energy, intermittent nausea for the past 3 days.  Has had some worsening kidney function, was told to come be evaluated if she had any of the symptoms.  Denies fever, no cough, no burning with urination, no pain.  Review of Systems  A thorough review of systems was obtained and all systems are negative except as noted in the HPI and PMH.   Patient's Health History    Past Medical History:  Diagnosis Date   Fibromyalgia    Hypothyroidism    Kidney disease, chronic, stage III (moderate, EGFR 30-59 ml/min) (HCC)    Migraine    Thyroid disease    UTI (lower urinary tract infection)     Past Surgical History:  Procedure Laterality Date   APPENDECTOMY     as a child   CESAREAN SECTION  2016   CESAREAN SECTION N/A 05/14/2019   Procedure: CESAREAN SECTION;  Surgeon: Tilda Burrow, MD;  Location: [facility] LD ORS;  Service: Obstetrics;  Laterality: N/A;   CHOLECYSTECTOMY N/A 11/06/2015   Procedure: LAPAROSCOPIC CHOLECYSTECTOMY;  Surgeon: Ancil Linsey, MD;  Location: [facility] ORS;  Service: General;  Laterality: N/A;   CYSTOSCOPY W/ DILATION OF BLADDER  03/11/2022   RENAL BIOPSY  08/02/2021   WISDOM TOOTH EXTRACTION      Family History  Problem Relation Age of Onset   Diabetes Mother        type 1 dx as young child. now with multiple complications   Thyroid disease Mother    Deep vein thrombosis Mother    Breast cancer Mother    Cirrhosis Mother    Bipolar disorder Mother    Depression Mother    Short stature Sister        s/p kidney failure   Kidney disease Sister    Deafness Sister    Cancer Maternal Grandmother        breast    Cancer Maternal Grandfather    Healthy Daughter    Healthy Son     Social History   Socioeconomic History   Marital status: Married    Spouse name: dustin   Number of children: 1   Years of education: 12   Highest education level: 10th grade  Occupational History   Occupation: homemaker  Tobacco Use   Smoking status: Never    Passive exposure: Never   Smokeless tobacco: Never  Vaping Use   Vaping Use: Never used  Substance and Sexual Activity   Alcohol use: No   Drug use: No   Sexual activity: Not Currently    Birth control/protection: None  Other Topics Concern   Not on file  Social History Narrative   Not on file   Social Determinants of Health   Financial Resource Strain: Low Risk  (11/05/2018)   Overall Financial Resource Strain (CARDIA)    Difficulty of Paying Living Expenses: Not very hard  Food Insecurity: Unknown (11/05/2018)   Hunger Vital Sign    Worried About Running Out of Food in the Last Year: Never true    Ran Out of Food in the Last Year: Not on file  Transportation Needs: No Transportation Needs (  11/05/2018)   PRAPARE - Administrator, Civil Service (Medical): No    Lack of Transportation (Non-Medical): No  Physical Activity: Unknown (11/05/2018)   Exercise Vital Sign    Days of Exercise per Week: 3 days    Minutes of Exercise per Session: Not on file  Stress: No Stress Concern Present (11/05/2018)   [employer] of Occupational Health - Occupational Stress Questionnaire    Feeling of Stress : Only a little  Social Connections: Moderately Integrated (11/05/2018)   Social Connection and Isolation Panel [NHANES]    Frequency of Communication with Friends and Family: More than three times a week    Frequency of Social Gatherings with Friends and Family: Twice a week    Attends Religious Services: More than 4 times per year    Active Member of [employer] or Organizations: No    Attends Banker Meetings: Never    Marital Status:  Married  Catering manager Violence: Not At Risk (11/05/2018)   Humiliation, Afraid, Rape, and Kick questionnaire    Fear of Current or Ex-Partner: No    Emotionally Abused: No    Physically Abused: No    Sexually Abused: No     Physical Exam   Vitals:   09/22/22 2200 09/22/22 2300  BP: 101/68 103/78  Pulse: 73 68  Resp: 18   Temp:  98 F (36.7 C)  SpO2: 100% 96%    CONSTITUTIONAL: Well-appearing, NAD NEURO/PSYCH:  Alert and oriented x 3, no focal deficits EYES:  eyes equal and reactive ENT/NECK:  no LAD, no JVD CARDIO: Regular rate, well-perfused, normal S1 and S2 PULM:  CTAB no wheezing or rhonchi GI/GU:  non-distended, non-tender MSK/SPINE:  No gross deformities, no edema SKIN:  no rash, atraumatic   *Additional and/or pertinent findings included in MDM below  Diagnostic and Interventional Summary    EKG Interpretation Date/Time:    Ventricular Rate:    PR Interval:    QRS Duration:    QT Interval:    QTC Calculation:   R Axis:      Text Interpretation:         Labs Reviewed  COMPREHENSIVE METABOLIC PANEL - Abnormal; Notable for the following components:      Result Value   Potassium 5.5 (*)    BUN 26 (*)    Creatinine, Ser 2.30 (*)    GFR, Estimated 29 (*)    All other components within normal limits  URINALYSIS, ROUTINE W REFLEX MICROSCOPIC - Abnormal; Notable for the following components:   APPearance CLOUDY (*)    Hgb urine dipstick LARGE (*)    Bilirubin Urine SMALL (*)    Protein, ur 30 (*)    Leukocytes,Ua TRACE (*)    All other components within normal limits  CBC - Abnormal; Notable for the following components:   WBC 13.9 (*)    RBC 3.48 (*)    Hemoglobin 10.5 (*)    HCT 31.9 (*)    All other components within normal limits  DIFFERENTIAL - Abnormal; Notable for the following components:   Neutro Abs 10.3 (*)    All other components within normal limits  URINALYSIS, MICROSCOPIC (REFLEX) - Abnormal; Notable for the following  components:   Bacteria, UA MANY (*)    All other components within normal limits  PREGNANCY, URINE  CBC WITH DIFFERENTIAL/PLATELET    No orders to display    Medications - No data to display   Procedures  /  Critical Care  Procedures  ED Course and Medical Decision Making  Initial Impression and Ddx Vague symptoms of malaise, fatigue.  Differential diagnosis includes azotemia given her recent issues with kidney disease.  Anemia, viral illness, hypothyroidism also considered.  She is well-appearing with normal vital signs, no pain, soft abdomen.  No focal neurological deficits.  Past medical/surgical history that increases complexity of ED encounter: Hypothyroidism, CKD  Interpretation of Diagnostics I personally reviewed the laboratory assessment and my interpretation is as follows: Baseline levels of kidney function, blood counts, minimal hyperkalemia    Patient Reassessment and Ultimate Disposition/Management     Patient provided with reassurance, no emergent process, will follow-up closely with her PCP.  Patient management required discussion with the following services or consulting groups:  None  Complexity of Problems Addressed Acute illness or injury that poses threat of life of bodily function  Additional Data Reviewed and Analyzed Further history obtained from: Further history from spouse/family member  Additional Factors Impacting ED Encounter Risk None  Elmer Sow. Pilar Plate, MD [facility] Emergency Medicine [facility] [email or web address]  Final Clinical Impressions(s) / ED Diagnoses     ICD-10-CM   1. Other fatigue  R53.83       ED Discharge Orders     None        Discharge Instructions Discussed with and Provided to Patient:   Discharge Instructions   None      Sabas Sous, MD 09/22/22 2342

## 2022-09-22 NOTE — ED Notes (Signed)
Report given to the next RN... 

## 2022-09-22 NOTE — ED Triage Notes (Signed)
POV from home, A&O x 4, GCS 15, amb to triage  Fatigue x 3 days, nausea today, hx of kidney disease.

## 2022-09-23 ENCOUNTER — Other Ambulatory Visit: Payer: Self-pay

## 2022-09-23 DIAGNOSIS — E063 Autoimmune thyroiditis: Secondary | ICD-10-CM

## 2022-09-23 DIAGNOSIS — E559 Vitamin D deficiency, unspecified: Secondary | ICD-10-CM

## 2022-09-24 LAB — TSH: TSH: 0.309 u[IU]/mL — ABNORMAL LOW (ref 0.450–4.500)

## 2022-09-24 LAB — VITAMIN D 25 HYDROXY (VIT D DEFICIENCY, FRACTURES): Vit D, 25-Hydroxy: 27.3 ng/mL — ABNORMAL LOW (ref 30.0–100.0)

## 2022-09-24 LAB — T4, FREE: Free T4: 1.99 ng/dL — ABNORMAL HIGH (ref 0.82–1.77)

## 2022-09-26 ENCOUNTER — Encounter: Payer: Self-pay | Admitting: "Endocrinology

## 2022-09-27 ENCOUNTER — Telehealth: Payer: Self-pay

## 2022-09-27 ENCOUNTER — Other Ambulatory Visit: Payer: Self-pay | Admitting: "Endocrinology

## 2022-09-27 MED ORDER — SYNTHROID 88 MCG PO TABS: 88.0000 ug | ORAL_TABLET | Freq: Every day | ORAL | 1 refills | Status: DC

## 2022-09-27 NOTE — Telephone Encounter (Signed)
Spoke with pt advising her that her thyroid labs show her thyroid hormone is too strong at the current dose she's taking and a Rx for a lower dose at daily will be sent in per Dr.Nida. Pt voiced understanding.

## 2022-09-27 NOTE — Telephone Encounter (Signed)
Pt called stating she had her labs drawn early due to experiencing increased fatigue, feeling sluggish and not feeling well at all. Please advise.

## 2022-10-07 ENCOUNTER — Ambulatory Visit: Payer: Medicaid Other | Admitting: "Endocrinology

## 2022-10-07 ENCOUNTER — Encounter: Payer: Self-pay | Admitting: "Endocrinology

## 2022-10-07 ENCOUNTER — Ambulatory Visit (INDEPENDENT_AMBULATORY_CARE_PROVIDER_SITE_OTHER): Payer: Medicaid Other | Admitting: "Endocrinology

## 2022-10-07 VITALS — BP 100/58 | HR 72 | Ht <= 58 in | Wt 200.4 lb

## 2022-10-07 DIAGNOSIS — D500 Iron deficiency anemia secondary to blood loss (chronic): Secondary | ICD-10-CM | POA: Diagnosis not present

## 2022-10-07 DIAGNOSIS — E559 Vitamin D deficiency, unspecified: Secondary | ICD-10-CM

## 2022-10-07 DIAGNOSIS — E042 Nontoxic multinodular goiter: Secondary | ICD-10-CM

## 2022-10-07 DIAGNOSIS — E038 Other specified hypothyroidism: Secondary | ICD-10-CM | POA: Diagnosis not present

## 2022-10-07 DIAGNOSIS — E063 Autoimmune thyroiditis: Secondary | ICD-10-CM

## 2022-10-07 MED ORDER — SYNTHROID 88 MCG PO TABS: 88.0000 ug | ORAL_TABLET | Freq: Every day | ORAL | 1 refills | Status: DC

## 2022-10-07 NOTE — Progress Notes (Signed)
10/07/2022           Endocrinology follow-up note   Subjective:    Patient ID: Hailey Thompson, female    DOB: 08-22-1995, PCP Jettie Pagan, NP   Past Medical History:  Diagnosis Date   Fibromyalgia    Hypothyroidism    Kidney disease, chronic, stage III (moderate, EGFR 30-59 ml/min) (HCC)    Migraine    Thyroid disease    UTI (lower urinary tract infection)    Past Surgical History:  Procedure Laterality Date   APPENDECTOMY     as a child   CESAREAN SECTION  2016   CESAREAN SECTION N/A 05/14/2019   Procedure: CESAREAN SECTION;  Surgeon: Tilda Burrow, MD;  Location: [facility];  Service: Obstetrics;  Laterality: N/A;   CHOLECYSTECTOMY N/A 11/06/2015   Procedure: LAPAROSCOPIC CHOLECYSTECTOMY;  Surgeon: Ancil Linsey, MD;  Location: [facility];  Service: General;  Laterality: N/A;   CYSTOSCOPY W/ DILATION OF BLADDER  03/11/2022   RENAL BIOPSY  08/02/2021   WISDOM TOOTH EXTRACTION     Social History   Socioeconomic History   Marital status: Married    Spouse name: dustin   Number of children: 1   Years of education: 12   Highest education level: 10th grade  Occupational History   Occupation: homemaker  Tobacco Use   Smoking status: Never    Passive exposure: Never   Smokeless tobacco: Never  Vaping Use   Vaping status: Never Used  Substance and Sexual Activity   Alcohol use: No   Drug use: No   Sexual activity: Not Currently    Birth control/protection: None  Other Topics Concern   Not on file  Social History Narrative   Not on file   Social Determinants of Health   Financial Resource Strain: Low Risk  (05/30/2022)   Received from [facility], [facility]   Overall Financial Resource Strain (CARDIA)    Difficulty of Paying Living Expenses: Not hard at all  Food Insecurity: No Food Insecurity (05/30/2022)   Received from [facility], [facility]   Hunger Vital Sign    Worried About Running Out of Food in the Last Year: Never true    Ran Out of  Food in the Last Year: Never true  Transportation Needs: No Transportation Needs (05/30/2022)   Received from [employer], [facility]   PRAPARE - Transportation    Lack of Transportation (Medical): No    Lack of Transportation (Non-Medical): No  Physical Activity: Insufficiently Active (05/26/2021)   Received from [facility], [facility]   Exercise Vital Sign    Days of Exercise per Week: 7 days    Minutes of Exercise per Session: 20 min  Stress: No Stress Concern Present (05/13/2022)   Received from [facility], [facility] - Occupational Stress Questionnaire    Feeling of Stress : Not at all  Social Connections: Unknown (07/27/2021)   Received from [facility], [facility]   Social Network    Social Network: Not on file   Outpatient Encounter Medications as of 10/07/2022  Medication Sig   sertraline (ZOLOFT) 50 MG tablet Take 50 mg by mouth daily.   ARIPiprazole (ABILIFY) 15 MG tablet Take 15 mg by mouth daily. (Patient not taking: Reported on 10/07/2022)   Cholecalciferol ([employer] D3) 125 MCG (5000 UT) capsule TAKE 1 CAPSULE (5,000 UNITS TOTAL) BY MOUTH EVERY OTHER DAY FOR 90 DAYS   lumateperone tosylate (CAPLYTA) 42  MG capsule Take one capsule by mouth at bedtime. (Patient not taking: Reported on 10/07/2022)   lumateperone tosylate (CAPLYTA) 42 MG capsule Take 1 capsule (42 mg total) by mouth at bedtime. (Patient not taking: Reported on 10/07/2022)   lumateperone tosylate (CAPLYTA) 42 MG capsule Take 1 capsule (42 mg total) by mouth at bedtime. (Patient not taking: Reported on 10/07/2022)   oxyCODONE-acetaminophen (PERCOCET/ROXICET) 5-325 MG tablet Take 1 tablet by mouth every 6 (six) hours as needed for up to 8 doses for severe pain. (Patient not taking: Reported on 10/07/2022)   Pediatric Multiple Vitamins (FLINSTONES GUMMIES OMEGA-3 DHA PO) Take 1 tablet by mouth daily.   promethazine (PHENERGAN) 25 MG tablet Take 1 tablet (25 mg total)  by mouth every 6 (six) hours as needed for nausea or vomiting.   propranolol (INDERAL) 10 MG tablet Take 10 mg by mouth 2 (two) times daily as needed. (Patient not taking: Reported on 10/07/2022)   SYNTHROID 88 MCG tablet Take 1 tablet (88 mcg total) by mouth daily before breakfast.   tolterodine (DETROL LA) 4 MG 24 hr capsule Take 4 mg by mouth daily. (Patient not taking: Reported on 10/07/2022)   [DISCONTINUED] celecoxib (CELEBREX) 100 MG capsule Take 100 mg by mouth 2 (two) times daily. (Patient not taking: Reported on 04/08/2022)   [DISCONTINUED] DULoxetine (CYMBALTA) 60 MG capsule Take 1 capsule by mouth daily. (Patient not taking: Reported on 04/08/2022)   [DISCONTINUED] famotidine (PEPCID) 20 MG tablet Take 1 tablet (20 mg total) by mouth 2 (two) times daily.   [DISCONTINUED] ferrous sulfate 325 (65 FE) MG tablet TAKE 1 TABLET BY MOUTH EVERY DAY WITH BREAKFAST (Patient not taking: Reported on 04/08/2022)   [DISCONTINUED] magnesium oxide (MAG-OX) 400 MG tablet Take by mouth. (Patient not taking: Reported on 04/08/2022)   [DISCONTINUED] mycophenolate (CELLCEPT) 500 MG tablet Take 1,000 mg by mouth 2 (two) times daily. (Patient not taking: Reported on 04/08/2022)   [DISCONTINUED] pregabalin (LYRICA) 100 MG capsule Take 100 mg by mouth at bedtime.   [DISCONTINUED] SYNTHROID 88 MCG tablet Take 1 tablet (88 mcg total) by mouth daily before breakfast.   No facility-administered encounter medications on file as of 10/07/2022.   ALLERGIES: Allergies  Allergen Reactions   Oxycodone Diarrhea   Prednisone Other (See Comments)    hallucinations   Versed [Midazolam] Hives    Hives after IV administration of zofran and versed. Unsure which medication caused reaction   Zofran [Ondansetron Hcl] Hives    Hives after IV adminstration of zofran and versed. Unsure of which medication caused reaction.     VACCINATION STATUS: Immunization History  Administered Date(s) Administered   Influenza Whole 02/14/2011    Influenza,inj,Quad PF,6+ Mos 03/18/2019   Tdap 03/18/2019    HPI 27 year old female patient with medical history as above.  She is currently on levothyroxine for hypothyroidism induced by Hashimoto's thyroiditis.  Her levothyroxine dose was recently adjusted to 80 mg p.o. daily before breakfast.  Patient is compliant and consistent taking her medication.    She also has history of vitamin D deficiency, and iron deficiency anemia. -She presents with steady weight.  Recently, she was diagnosed with CKD, following with the nephrology group. -he denies tremors, heat intolerance.  Palpitations are intermittent. Due to neck pain, she underwent MRI of the spine which incidentally showed multiple thyroid nodules.  She was to know more information about her thyroid anatomy.  - She reports that she was diagnosed with hypothyroidism at age 65 years, treated with various doses of  levothyroxine over the years.   - She has family history of hypothyroidism in her mother. She denies family history of thyroid cancer. She denies any personal history of goiter.   Review of Systems Limited as above.    Objective:    BP (!) 100/58   Pulse 72   Ht 4\' 6"  (1.372 m)   Wt 200 lb 6.4 oz (90.9 kg)   LMP 08/26/2022 (Approximate)   BMI 48.32 kg/m   Wt Readings from Last 3 Encounters:  10/07/22 200 lb 6.4 oz (90.9 kg)  09/22/22 198 lb (89.8 kg)  07/06/22 201 lb (91.2 kg)    Physical Exam- Limited   CMP     Component Value Date/Time   NA 138 09/22/2022 1941   NA 142 03/30/2022 1426   K 5.5 (H) 09/22/2022 1941   CL 107 09/22/2022 1941   CO2 22 09/22/2022 1941   GLUCOSE 87 09/22/2022 1941   BUN 26 (H) 09/22/2022 1941   BUN 19 03/30/2022 1426   CREATININE 2.30 (H) 09/22/2022 1941   CREATININE 0.56 07/23/2013 0815   CALCIUM 9.2 09/22/2022 1941   CALCIUM 8.9 07/19/2011 1547   PROT 8.0 09/22/2022 1941   PROT 7.2 03/30/2022 1426   ALBUMIN 3.9 09/22/2022 1941   ALBUMIN 3.8 (L) 03/30/2022 1426    AST 27 09/22/2022 1941   ALT 18 09/22/2022 1941   ALKPHOS 125 09/22/2022 1941   BILITOT 0.4 09/22/2022 1941   BILITOT 0.2 03/30/2022 1426   GFRNONAA 29 (L) 09/22/2022 1941   GFRAA >60 05/14/2019 1222     Diabetic Labs (most recent): Lab Results  Component Value Date   HGBA1C 5.0 02/04/2017   HGBA1C 5.1 07/16/2012   HGBA1C 5.4 10/25/2011     Lipid Panel ( most recent) Lipid Panel     Component Value Date/Time   CHOL 156 07/23/2013 0815   TRIG 131 07/23/2013 0815   HDL 42 07/23/2013 0815   CHOLHDL 3.7 07/23/2013 0815   VLDL 26 07/23/2013 0815   LDLCALC 88 07/23/2013 0815   Recent Results (from the past 2160 hour(s))  Urinalysis, Routine w reflex microscopic -Urine, Clean Catch     Status: None   Collection Time: 07/28/22  7:20 PM  Result Value Ref Range   Color, Urine YELLOW YELLOW   APPearance CLEAR CLEAR   Specific Gravity, Urine 1.017 1.005 - 1.030   pH 7.0 5.0 - 8.0   Glucose, UA NEGATIVE NEGATIVE mg/dL   Hgb urine dipstick NEGATIVE NEGATIVE   Bilirubin Urine NEGATIVE NEGATIVE   Ketones, ur NEGATIVE NEGATIVE mg/dL   Protein, ur NEGATIVE NEGATIVE mg/dL   Nitrite NEGATIVE NEGATIVE   Leukocytes,Ua NEGATIVE NEGATIVE    Comment: Performed at [facility], [address] [place], [place] [address]  Pregnancy, urine     Status: None   Collection Time: 07/28/22  7:20 PM  Result Value Ref Range   Preg Test, Ur NEGATIVE NEGATIVE    Comment:        THE SENSITIVITY OF THIS METHODOLOGY IS >20 mIU/mL. Performed at [facility], [address] [place], [place] [address]   Lipase, blood     Status: Abnormal   Collection Time: 07/28/22  7:43 PM  Result Value Ref Range   Lipase 102 (H) 11 - 51 U/L    Comment: Performed at [facility], [address], [place], [place] [address]  Comprehensive metabolic panel     Status: Abnormal   Collection Time: 07/28/22  7:43 PM  Result Value Ref Range   Sodium 139 135 -  145 mmol/L   Potassium 3.9 3.5 - 5.1 mmol/L   Chloride 105 98 - 111 mmol/L   CO2 26 22 - 32 mmol/L   Glucose, Bld 90 70 - 99 mg/dL    Comment: Glucose reference range applies only to samples taken after fasting for at least 8 hours.   BUN 19 6 - 20 mg/dL   Creatinine, Ser 1.61 (H) 0.44 - 1.00 mg/dL   Calcium 8.1 (L) 8.9 - 10.3 mg/dL   Total Protein 7.3 6.5 - 8.1 g/dL   Albumin 3.7 3.5 - 5.0 g/dL   AST 24 15 - 41 U/L   ALT 22 0 - 44 U/L   Alkaline Phosphatase 93 38 - 126 U/L   Total Bilirubin 0.2 (L) 0.3 - 1.2 mg/dL   GFR, Estimated 41 (L) >60 mL/min    Comment: (NOTE) Calculated using the CKD-EPI Creatinine Equation (2021)    Anion gap 8 5 - 15    Comment: Performed at [facility], [address], [place], [place] [address]  CBC     Status: Abnormal   Collection Time: 07/28/22  7:43 PM  Result Value Ref Range   WBC 11.7 (H) 4.0 - 10.5 K/uL   RBC 3.37 (L) 3.87 - 5.11 MIL/uL   Hemoglobin 10.2 (L) 12.0 - 15.0 g/dL   HCT 54.0 (L) 98.1 - 19.1 %   MCV 95.3 80.0 - 100.0 fL   MCH 30.3 26.0 - 34.0 pg   MCHC 31.8 30.0 - 36.0 g/dL   RDW 47.8 29.5 - 62.1 %   Platelets 238 150 - 400 K/uL   nRBC 0.0 0.0 - 0.2 %    Comment: Performed at [facility], [address], [place], [place] [address]  Comprehensive metabolic panel     Status: Abnormal   Collection Time: 09/22/22  7:41 PM  Result Value Ref Range   Sodium 138 135 - 145 mmol/L   Potassium 5.5 (H) 3.5 - 5.1 mmol/L   Chloride 107 98 - 111 mmol/L   CO2 22 22 - 32 mmol/L   Glucose, Bld 87 70 - 99 mg/dL    Comment: Glucose reference range applies only to samples taken after fasting for at least 8 hours.   BUN 26 (H) 6 - 20 mg/dL   Creatinine, Ser 7.84 (H) 0.44 - 1.00 mg/dL   Calcium 9.2 8.9 - 69.6 mg/dL   Total Protein 8.0 6.5 - 8.1 g/dL   Albumin 3.9 3.5 - 5.0 g/dL   AST 27 15 - 41 U/L   ALT 18 0 - 44 U/L   Alkaline Phosphatase 125 38 - 126 U/L   Total Bilirubin 0.4 0.3 - 1.2 mg/dL    GFR, Estimated 29 (L) >60 mL/min    Comment: (NOTE) Calculated using the CKD-EPI Creatinine Equation (2021)    Anion gap 9 5 - 15    Comment: Performed at [facility], [address] [place], [place] [address]  Pregnancy, urine     Status: None   Collection Time: 09/22/22  7:42 PM  Result Value Ref Range   Preg Test, Ur NEGATIVE NEGATIVE    Comment:        THE SENSITIVITY OF THIS METHODOLOGY IS >25 mIU/mL. Performed at [facility], [address] [place], [place] [address]   Urinalysis, Routine w reflex microscopic -Urine, Clean Catch     Status: Abnormal   Collection Time: 09/22/22  7:42 PM  Result Value Ref Range   Color, Urine YELLOW YELLOW   APPearance CLOUDY (A) CLEAR   Specific Gravity, Urine 1.025 1.005 - 1.030   pH 6.0 5.0 - 8.0   Glucose, UA NEGATIVE NEGATIVE mg/dL   Hgb urine dipstick LARGE (A) NEGATIVE   Bilirubin Urine SMALL (A) NEGATIVE   Ketones, ur NEGATIVE NEGATIVE mg/dL   Protein, ur 30 (A) NEGATIVE mg/dL   Nitrite NEGATIVE NEGATIVE   Leukocytes,Ua TRACE (A) NEGATIVE    Comment: Performed at [facility], [address], [place], [place] [address]  Urinalysis, Microscopic (reflex)     Status: Abnormal   Collection Time: 09/22/22  7:42 PM  Result Value Ref Range   RBC / HPF 6-10 0 - 5 RBC/hpf   WBC, UA 0-5 0 - 5 WBC/hpf   Bacteria, UA MANY (A) NONE SEEN   Squamous Epithelial / HPF 6-10 0 - 5 /HPF   Urine-Other LESS THAN 10 mL OF URINE SUBMITTED     Comment: MICROSCOPIC EXAM PERFORMED ON UNCONCENTRATED URINE Performed at [facility], [address] [place], [place] [address]   CBC     Status: Abnormal   Collection Time: 09/22/22  9:16 PM  Result Value Ref Range   WBC 13.9 (H) 4.0 - 10.5 K/uL   RBC 3.48 (L) 3.87 - 5.11 MIL/uL   Hemoglobin 10.5 (L) 12.0 - 15.0 g/dL   HCT 38.4 (L) 66.5 - 99.3 %   MCV 91.7 80.0 - 100.0 fL   MCH 30.2 26.0 - 34.0 pg   MCHC 32.9 30.0 - 36.0  g/dL   RDW 57.0 17.7 - 93.9 %   Platelets 278 150 - 400 K/uL   nRBC 0.0 0.0 - 0.2 %    Comment: Performed at [facility], [address], [place], [place] [address]  Differential     Status: Abnormal   Collection Time: 09/22/22  9:16 PM  Result Value Ref Range   Neutrophils Relative % 74 %   Neutro Abs 10.3 (H) 1.7 - 7.7 K/uL   Lymphocytes Relative 17 %   Lymphs Abs 2.4 0.7 - 4.0 K/uL   Monocytes Relative 6 %   Monocytes Absolute 0.8 0.1 - 1.0 K/uL   Eosinophils Relative 3 %   Eosinophils Absolute 0.4 0.0 - 0.5 K/uL   Basophils Relative 0 %   Basophils Absolute 0.1 0.0 - 0.1 K/uL   Immature Granulocytes 0 %   Abs Immature Granulocytes 0.06 0.00 - 0.07 K/uL    Comment: Performed at [facility], [address] [place], [place] [address]  Vitamin D, 25-hydroxy     Status: Abnormal   Collection Time: 09/23/22  1:22 PM  Result Value Ref Range   Vit D, 25-Hydroxy 27.3 (L) 30.0 - 100.0 ng/mL    Comment: Vitamin D deficiency has been defined by the Institute of Medicine and an Endocrine Society practice guideline as a level of serum 25-OH vitamin D less than 20 ng/mL (1,2). The Endocrine Society went on to further define vitamin D insufficiency as a level between 21 and 29 ng/mL (2). 1. IOM (Institute of Medicine). 2010. Dietary reference    intakes for calcium and D. [place]: The    [employer]. 2. Holick MF, Binkley [place], Bischoff-Ferrari HA, et al.    Evaluation, treatment, and prevention of vitamin D    deficiency: an Endocrine Society clinical practice    guideline. JCEM. 2011 Jul; 96(7):1911-30.   TSH  Status: Abnormal   Collection Time: 09/23/22  1:22 PM  Result Value Ref Range   TSH 0.309 (L) 0.450 - 4.500 uIU/mL  T4, Free     Status: Abnormal   Collection Time: 09/23/22  1:22 PM  Result Value Ref Range   Free T4 1.99 (H) 0.82 - 1.77 ng/dL     Assessment & Plan:   1. Hypothyroidism due to Hashimoto's  thyroiditis 2.  Multiple thyroid nodules 3.  Vitamin D deficiency -Her previsit thyroid function tests are consistent with over replacement.  I discussed and lowered her Synthroid to 88 mcg p.o. daily before breakfast.     - We discussed about the correct intake of her thyroid hormone, on empty stomach at fasting, with water, separated by at least 30 minutes from breakfast and other medications,  and separated by more than 4 hours from calcium, iron, multivitamins, acid reflux medications (PPIs). -Patient is made aware of the fact that thyroid hormone replacement is needed for life, dose to be adjusted by periodic monitoring of thyroid function tests.   -Her last CBC showed hemoglobin of 10.5, benefiting from iron supplement.  Reportedly, her other providers advised her to hold anencephaly for now.     -Her vitamin D has corrected at 45, this is improving from 32.  She is advised to continue maintenance by taking vitamin D3 5000 units every other day.  She is advised to continue her close follow-up with nephrology.    In light of her recent MRI showing multiple thyroid nodules, her subsequent dedicated thyroid ultrasound did not show any nodular lesions, showed shrunk thyroid consistent with Hashimoto's thyroiditis.    -She does not have diabetes/prediabetes, her recent A1c was 5%.   Her next labs will include celiac screen, vitamin B12 levels and thyroid function tests.  She is advised to continue close follow-up with her PCP [facility], PA-C.    I spent  25  minutes in the care of the patient today including review of labs from Thyroid Function, CMP, and other relevant labs ; imaging/biopsy records (current and previous including abstractions from other facilities); face-to-face time discussing  her lab results and symptoms, medications doses, her options of short and long term treatment based on the latest standards of care / guidelines;   and documenting the encounter.  Hailey Thompson   participated in the discussions, expressed understanding, and voiced agreement with the above plans.  All questions were answered to her satisfaction. she is encouraged to contact clinic should she have any questions or concerns prior to her return visit.   Follow up plan: Return in about 3 months (around 01/07/2023) for F/U with Pre-visit Labs.  Marquis Lunch, MD Phone: [phone]  Fax: [phone]  -  This note was partially dictated with voice recognition software. Similar sounding words can be transcribed inadequately or may not  be corrected upon review.  10/07/2022, 2:28 PM

## 2022-10-24 DIAGNOSIS — Z803 Family history of malignant neoplasm of breast: Secondary | ICD-10-CM | POA: Insufficient documentation

## 2022-11-07 ENCOUNTER — Emergency Department (HOSPITAL_BASED_OUTPATIENT_CLINIC_OR_DEPARTMENT_OTHER)
Admission: EM | Admit: 2022-11-07 | Discharge: 2022-11-07 | Disposition: A | Discharge status: Home / Self Care | Payer: Medicaid Other | Attending: Emergency Medicine | Admitting: Emergency Medicine

## 2022-11-07 ENCOUNTER — Emergency Department (HOSPITAL_BASED_OUTPATIENT_CLINIC_OR_DEPARTMENT_OTHER): Payer: Medicaid Other

## 2022-11-07 ENCOUNTER — Other Ambulatory Visit: Payer: Self-pay

## 2022-11-07 ENCOUNTER — Encounter (HOSPITAL_BASED_OUTPATIENT_CLINIC_OR_DEPARTMENT_OTHER): Payer: Self-pay

## 2022-11-07 DIAGNOSIS — R10A0 Flank pain, unspecified side: Secondary | ICD-10-CM

## 2022-11-07 DIAGNOSIS — N183 Chronic kidney disease, stage 3 unspecified: Secondary | ICD-10-CM | POA: Insufficient documentation

## 2022-11-07 DIAGNOSIS — Z8616 Personal history of COVID-19: Secondary | ICD-10-CM | POA: Insufficient documentation

## 2022-11-07 DIAGNOSIS — E039 Hypothyroidism, unspecified: Secondary | ICD-10-CM | POA: Diagnosis not present

## 2022-11-07 DIAGNOSIS — R109 Unspecified abdominal pain: Secondary | ICD-10-CM | POA: Insufficient documentation

## 2022-11-07 LAB — COMPREHENSIVE METABOLIC PANEL
ALT: 16 U/L (ref 0–44)
AST: 19 U/L (ref 15–41)
Albumin: 4.1 g/dL (ref 3.5–5.0)
Alkaline Phosphatase: 101 U/L (ref 38–126)
Anion gap: 9 (ref 5–15)
BUN: 26 mg/dL — ABNORMAL HIGH (ref 6–20)
CO2: 23 mmol/L (ref 22–32)
Calcium: 9.1 mg/dL (ref 8.9–10.3)
Chloride: 106 mmol/L (ref 98–111)
Creatinine, Ser: 1.97 mg/dL — ABNORMAL HIGH (ref 0.44–1.00)
GFR, Estimated: 35 mL/min — ABNORMAL LOW (ref >60)
Glucose, Bld: 80 mg/dL (ref 70–99)
Potassium: 4.2 mmol/L (ref 3.5–5.1)
Sodium: 138 mmol/L (ref 135–145)
Total Bilirubin: 0.3 mg/dL (ref 0.3–1.2)
Total Protein: 7.8 g/dL (ref 6.5–8.1)

## 2022-11-07 LAB — CBC WITH DIFFERENTIAL/PLATELET
Abs Immature Granulocytes: 0.05 10*3/uL (ref 0.00–0.07)
Basophils Absolute: 0.1 10*3/uL (ref 0.0–0.1)
Basophils Relative: 1 %
Eosinophils Absolute: 0.5 10*3/uL (ref 0.0–0.5)
Eosinophils Relative: 4 %
HCT: 33.5 % — ABNORMAL LOW (ref 36.0–46.0)
Hemoglobin: 10.8 g/dL — ABNORMAL LOW (ref 12.0–15.0)
Immature Granulocytes: 0 %
Lymphocytes Relative: 17 %
Lymphs Abs: 2.3 10*3/uL (ref 0.7–4.0)
MCH: 29.8 pg (ref 26.0–34.0)
MCHC: 32.2 g/dL (ref 30.0–36.0)
MCV: 92.3 fL (ref 80.0–100.0)
Monocytes Absolute: 0.6 10*3/uL (ref 0.1–1.0)
Monocytes Relative: 4 %
Neutro Abs: 9.7 10*3/uL — ABNORMAL HIGH (ref 1.7–7.7)
Neutrophils Relative %: 74 %
Platelets: 288 10*3/uL (ref 150–400)
RBC: 3.63 MIL/uL — ABNORMAL LOW (ref 3.87–5.11)
RDW: 13 % (ref 11.5–15.5)
WBC: 13.1 10*3/uL — ABNORMAL HIGH (ref 4.0–10.5)
nRBC: 0 % (ref 0.0–0.2)

## 2022-11-07 LAB — URINALYSIS, ROUTINE W REFLEX MICROSCOPIC
Bilirubin Urine: NEGATIVE
Glucose, UA: NEGATIVE mg/dL
Ketones, ur: NEGATIVE mg/dL
Leukocytes,Ua: NEGATIVE
Nitrite: NEGATIVE
Protein, ur: 30 mg/dL — AB
Specific Gravity, Urine: 1.015 (ref 1.005–1.030)
pH: 6 (ref 5.0–8.0)

## 2022-11-07 LAB — PREGNANCY, URINE: Preg Test, Ur: NEGATIVE

## 2022-11-07 LAB — LIPASE, BLOOD: Lipase: 73 U/L — ABNORMAL HIGH (ref 11–51)

## 2022-11-07 NOTE — ED Notes (Signed)
 RN reviewed discharge instructions with pt. Pt verbalized understanding and had no further questions. VSS upon discharge.  

## 2022-11-07 NOTE — ED Triage Notes (Addendum)
Pt to ED c/o left flank pain. Pt reports kidney biopsy Thursday, d/t stage 3 kidney disease unknown cause. Told to come to ED d/t continuous pain at incision site, no bruising noted to wound site .

## 2022-11-07 NOTE — ED Provider Notes (Signed)
Patient was pending CT scan following renal biopsy done at [facility] by interventional radiology.  CT scan here today shows a small hematoma inferior pole the left kidney in keeping with the history of the recent biopsy.  No significant abnormalities.  Patient stable for discharge home.   Vanetta Mulders, MD 11/07/22 1704

## 2022-11-07 NOTE — Discharge Instructions (Signed)
Please follow up with your kidney specialist at [facility]  It was a pleasure caring for you today in the emergency department.  Please return to the emergency department for any worsening or worrisome symptoms.

## 2022-11-07 NOTE — ED Provider Notes (Signed)
[place] EMERGENCY DEPARTMENT AT [facility] Provider Note  CSN: [id number] Arrival date & time: 11/07/22 1155  Chief Complaint(s) Flank Pain (left)  HPI Hailey Thompson is a 27 y.o. female with past medical history as below, significant for CKD3, migraine, prior c-section, fibromyalgia who presents to the ED with complaint of left flank pain. She had renal biopsy last Thursday at [facility]. Has been having some slight bleeding and sharp pain in that area since then. She called Dr at [facility] and was advised to come to ED for eval. No fevers or chills, no drainage from surgical site, no change to bowel/bladder fxn. No hematuria/dysuria, no brbpr or melena  Past Medical History Past Medical History:  Diagnosis Date   Fibromyalgia    Hypothyroidism    Kidney disease, chronic, stage III (moderate, EGFR 30-59 ml/min) (HCC)    Migraine    Thyroid disease    UTI (lower urinary tract infection)    Patient Active Problem List   Diagnosis Date Noted   Multiple thyroid nodules 10/07/2022   Iron deficiency anemia due to chronic blood loss 07/08/2019   Vitamin D deficiency 07/08/2019   COVID-19 04/18/2019   History of cesarean section 09/26/2018   Other fatigue 01/24/2018   Vitiligo 10/25/2011   Short stature 07/19/2011   Hypothyroidism 07/19/2011   Obesity 07/19/2011   Hypercholesterolemia with hypertriglyceridemia 07/19/2011   Home Medication(s) Prior to Admission medications   Medication Sig Start Date End Date Taking? Authorizing Provider  ARIPiprazole (ABILIFY) 15 MG tablet Take 15 mg by mouth daily. Patient not taking: Reported on 10/07/2022 03/30/22   [provider]  Cholecalciferol ([employer] D3) 125 MCG (5000 UT) capsule TAKE 1 CAPSULE (5,000 UNITS TOTAL) BY MOUTH EVERY OTHER DAY FOR 90 DAYS 03/30/22   Nida, Denman George, MD  lumateperone tosylate (CAPLYTA) 42 MG capsule Take one capsule by mouth at bedtime. Patient not taking: Reported on 10/07/2022 07/11/22     lumateperone  tosylate (CAPLYTA) 42 MG capsule Take 1 capsule (42 mg total) by mouth at bedtime. Patient not taking: Reported on 10/07/2022 07/26/22     lumateperone tosylate (CAPLYTA) 42 MG capsule Take 1 capsule (42 mg total) by mouth at bedtime. Patient not taking: Reported on 10/07/2022 07/27/22     oxyCODONE-acetaminophen (PERCOCET/ROXICET) 5-325 MG tablet Take 1 tablet by mouth every 6 (six) hours as needed for up to 8 doses for severe pain. Patient not taking: Reported on 10/07/2022 07/28/22   Jeanelle Malling, PA  Pediatric Multiple Vitamins (FLINSTONES GUMMIES OMEGA-3 DHA PO) Take 1 tablet by mouth daily.    [provider]  promethazine (PHENERGAN) 25 MG tablet Take 1 tablet (25 mg total) by mouth every 6 (six) hours as needed for nausea or vomiting. 07/28/22   Jeanelle Malling, PA  propranolol (INDERAL) 10 MG tablet Take 10 mg by mouth 2 (two) times daily as needed. Patient not taking: Reported on 10/07/2022 03/30/22   [provider]  sertraline (ZOLOFT) 50 MG tablet Take 50 mg by mouth daily. 09/21/22   [provider]  SYNTHROID 88 MCG tablet Take 1 tablet (88 mcg total) by mouth daily before breakfast. 10/07/22   Nida, Denman George, MD  tolterodine (DETROL LA) 4 MG 24 hr capsule Take 4 mg by mouth daily. Patient not taking: Reported on 10/07/2022 03/11/22   [provider]  famotidine (PEPCID) 20 MG tablet Take 1 tablet (20 mg total) by mouth 2 (two) times daily. 06/11/14 06/11/14  Donnetta Hutching, MD  Past Surgical History Past Surgical History:  Procedure Laterality Date   APPENDECTOMY     as a child   CESAREAN SECTION  2016   CESAREAN SECTION N/A 05/14/2019   Procedure: CESAREAN SECTION;  Surgeon: Tilda Burrow, MD;  Location: [facility] LD ORS;  Service: Obstetrics;  Laterality: N/A;   CHOLECYSTECTOMY N/A 11/06/2015   Procedure: LAPAROSCOPIC CHOLECYSTECTOMY;   Surgeon: Ancil Linsey, MD;  Location: [facility] ORS;  Service: General;  Laterality: N/A;   CYSTOSCOPY W/ DILATION OF BLADDER  03/11/2022   RENAL BIOPSY  08/02/2021   WISDOM TOOTH EXTRACTION     Family History Family History  Problem Relation Age of Onset   Diabetes Mother        type 1 dx as young child. now with multiple complications   Thyroid disease Mother    Deep vein thrombosis Mother    Breast cancer Mother    Cirrhosis Mother    Bipolar disorder Mother    Depression Mother    Short stature Sister        s/p kidney failure   Kidney disease Sister    Deafness Sister    Cancer Maternal Grandmother        breast   Cancer Maternal Grandfather    Healthy Daughter    Healthy Son     Social History Social History   Tobacco Use   Smoking status: Never    Passive exposure: Never   Smokeless tobacco: Never  Vaping Use   Vaping status: Never Used  Substance Use Topics   Alcohol use: No   Drug use: No   Allergies Oxycodone, Prednisone, Versed [midazolam], and Zofran [ondansetron hcl]  Review of Systems Review of Systems  Constitutional:  Negative for chills and fever.  HENT:  Negative for facial swelling and trouble swallowing.   Eyes:  Negative for photophobia and visual disturbance.  Respiratory:  Negative for cough and shortness of breath.   Cardiovascular:  Negative for chest pain and palpitations.  Gastrointestinal:  Positive for abdominal pain. Negative for nausea and vomiting.  Endocrine: Negative for polydipsia and polyuria.  Genitourinary:  Positive for flank pain. Negative for difficulty urinating and hematuria.  Musculoskeletal:  Negative for gait problem and joint swelling.  Skin:  Negative for pallor and rash.  Neurological:  Negative for syncope and headaches.  Psychiatric/Behavioral:  Negative for agitation and confusion.     Physical Exam Vital Signs  I have reviewed the triage vital signs BP 104/77 (BP Location: Left Arm)   Pulse 71   Temp  98.5 F (36.9 C) (Oral)   Resp 15   Ht 4\' 6"  (1.372 m)   Wt 90.7 kg   LMP 10/17/2022   SpO2 98%   BMI 48.22 kg/m  Physical Exam Vitals and nursing note reviewed.  Constitutional:      General: She is not in acute distress.    Appearance: Normal appearance.  HENT:     Head: Normocephalic and atraumatic.     Right Ear: External ear normal.     Left Ear: External ear normal.     Nose: Nose normal.     Mouth/Throat:     Mouth: Mucous membranes are moist.  Eyes:     General: No scleral icterus.       Right eye: No discharge.        Left eye: No discharge.  Cardiovascular:     Rate and Rhythm: Normal rate and regular rhythm.     Pulses: Normal  pulses.     Heart sounds: Normal heart sounds.  Pulmonary:     Effort: Pulmonary effort is normal. No respiratory distress.     Breath sounds: Normal breath sounds. No stridor.  Abdominal:     General: Abdomen is flat. There is no distension.     Palpations: Abdomen is soft.     Tenderness: There is no abdominal tenderness. There is no guarding or rebound.  Musculoskeletal:       Arms:     Cervical back: No rigidity.     Right lower leg: No edema.     Left lower leg: No edema.  Skin:    General: Skin is warm and dry.     Capillary Refill: Capillary refill takes less than 2 seconds.  Neurological:     Mental Status: She is alert and oriented to person, place, and time.     GCS: GCS eye subscore is 4. GCS verbal subscore is 5. GCS motor subscore is 6.  Psychiatric:        Mood and Affect: Mood normal.        Behavior: Behavior normal. Behavior is cooperative.     ED Results and Treatments Labs (all labs ordered are listed, but only abnormal results are displayed) Labs Reviewed  CBC WITH DIFFERENTIAL/PLATELET - Abnormal; Notable for the following components:      Result Value   WBC 13.1 (*)    RBC 3.63 (*)    Hemoglobin 10.8 (*)    HCT 33.5 (*)    Neutro Abs 9.7 (*)    All other components within normal limits   COMPREHENSIVE METABOLIC PANEL - Abnormal; Notable for the following components:   BUN 26 (*)    Creatinine, Ser 1.97 (*)    GFR, Estimated 35 (*)    All other components within normal limits  LIPASE, BLOOD - Abnormal; Notable for the following components:   Lipase 73 (*)    All other components within normal limits  URINALYSIS, ROUTINE W REFLEX MICROSCOPIC - Abnormal; Notable for the following components:   APPearance HAZY (*)    Hgb urine dipstick SMALL (*)    Protein, ur 30 (*)    Bacteria, UA RARE (*)    All other components within normal limits  PREGNANCY, URINE                                                                                                                          Radiology No results found.  Pertinent labs & imaging results that were available during my care of the patient were reviewed by me and considered in my medical decision making (see MDM for details).  Medications Ordered in ED Medications - No data to display  Procedures Procedures  (including critical care time)  Medical Decision Making / ED Course    Medical Decision Making:    Zandaya Fournet is a 27 y.o. female with past medical history as below, significant for CKD3, migraine, prior c-section, fibromyalgia who presents to the ED with complaint of left flank pain. . The complaint involves an extensive differential diagnosis and also carries with it a high risk of complications and morbidity.  Serious etiology was considered. Ddx includes but is not limited to: Differential diagnosis includes but is not exclusive to ectopic pregnancy, ovarian cyst, ovarian torsion, acute appendicitis, urinary tract infection, endometriosis, bowel obstruction, hernia, colitis, renal colic, gastroenteritis, volvulus etc.   Complete initial physical exam performed, notably the patient  was  NAD, sitting upright, abd soft, non-peritoneal. .    Reviewed and confirmed nursing documentation for past medical history, family history, social history.  Vital signs reviewed.       Narrative: 27 yo female here with left flank pain follow recent renal biopsy Pain minimal, does not want any medications for this HDS Screening labs obtained Hgb similar to her baseline Cr similar to prior, slightly improved  UA w/ contamination and some blood noted                 Additional history obtained: -Additional history obtained from spouse -External records from outside source obtained and reviewed including: Chart review including previous notes, labs, imaging, consultation notes including  Prior labs/imaging/home meds Primary care documentation Prior ed eval  Lab Tests: -I ordered, reviewed, and interpreted labs.   The pertinent results include:   Labs Reviewed  CBC WITH DIFFERENTIAL/PLATELET - Abnormal; Notable for the following components:      Result Value   WBC 13.1 (*)    RBC 3.63 (*)    Hemoglobin 10.8 (*)    HCT 33.5 (*)    Neutro Abs 9.7 (*)    All other components within normal limits  COMPREHENSIVE METABOLIC PANEL - Abnormal; Notable for the following components:   BUN 26 (*)    Creatinine, Ser 1.97 (*)    GFR, Estimated 35 (*)    All other components within normal limits  LIPASE, BLOOD - Abnormal; Notable for the following components:   Lipase 73 (*)    All other components within normal limits  URINALYSIS, ROUTINE W REFLEX MICROSCOPIC - Abnormal; Notable for the following components:   APPearance HAZY (*)    Hgb urine dipstick SMALL (*)    Protein, ur 30 (*)    Bacteria, UA RARE (*)    All other components within normal limits  PREGNANCY, URINE    Notable for stable as above  EKG   EKG Interpretation Date/Time:    Ventricular Rate:    PR Interval:    QRS Duration:    QT Interval:    QTC Calculation:   R Axis:      Text Interpretation:            Imaging Studies ordered: I ordered imaging studies including CTAP I independently visualized the following imaging with scope of interpretation limited to determining acute life threatening conditions related to emergency care; findings noted above, significant for *** I independently visualized and interpreted imaging. I agree with the radiologist interpretation   Medicines ordered and prescription drug management: No orders of the defined types were placed in this encounter.   -I have reviewed the patients home medicines and have made adjustments as needed   Consultations Obtained: na  Cardiac Monitoring: The patient was maintained on a cardiac monitor.  I personally viewed and interpreted the cardiac monitored which showed an underlying rhythm of: NSR  Social Determinants of Health:  Diagnosis or treatment significantly limited by social determinants of health: obesity   Reevaluation: After the interventions noted above, I reevaluated the patient and found that they have improved  Co morbidities that complicate the patient evaluation  Past Medical History:  Diagnosis Date   Fibromyalgia    Hypothyroidism    Kidney disease, chronic, stage III (moderate, EGFR 30-59 ml/min) (HCC)    Migraine    Thyroid disease    UTI (lower urinary tract infection)       Dispostion: Disposition decision including need for hospitalization was considered, and patient disposition pending at time of sign out.    Final Clinical Impression(s) / ED Diagnoses Final diagnoses:  None

## 2022-11-29 ENCOUNTER — Other Ambulatory Visit: Payer: Self-pay | Admitting: "Endocrinology

## 2023-01-10 ENCOUNTER — Ambulatory Visit: Payer: Medicaid Other | Admitting: "Endocrinology

## 2023-01-18 ENCOUNTER — Emergency Department (HOSPITAL_BASED_OUTPATIENT_CLINIC_OR_DEPARTMENT_OTHER)
Admission: EM | Admit: 2023-01-18 | Discharge: 2023-01-19 | Disposition: A | Discharge status: Home / Self Care | Payer: Medicaid Other | Attending: Emergency Medicine | Admitting: Emergency Medicine

## 2023-01-18 ENCOUNTER — Encounter (HOSPITAL_BASED_OUTPATIENT_CLINIC_OR_DEPARTMENT_OTHER): Payer: Self-pay | Admitting: *Deleted

## 2023-01-18 ENCOUNTER — Other Ambulatory Visit: Payer: Self-pay

## 2023-01-18 DIAGNOSIS — E039 Hypothyroidism, unspecified: Secondary | ICD-10-CM | POA: Diagnosis not present

## 2023-01-18 DIAGNOSIS — N183 Chronic kidney disease, stage 3 unspecified: Secondary | ICD-10-CM | POA: Insufficient documentation

## 2023-01-18 DIAGNOSIS — Z79899 Other long term (current) drug therapy: Secondary | ICD-10-CM | POA: Diagnosis not present

## 2023-01-18 DIAGNOSIS — J029 Acute pharyngitis, unspecified: Secondary | ICD-10-CM

## 2023-01-18 DIAGNOSIS — D72829 Elevated white blood cell count, unspecified: Secondary | ICD-10-CM | POA: Insufficient documentation

## 2023-01-18 NOTE — ED Triage Notes (Signed)
Pt is here for sore thorat.  She has tonsillitis and has been taking antibiotics and steroids and completed the course 2 days ago.  Pt states that even though symptoms never fully resolved she states that it has been getting worse since she has been off of antibiotics/steroids.

## 2023-01-19 ENCOUNTER — Emergency Department (HOSPITAL_BASED_OUTPATIENT_CLINIC_OR_DEPARTMENT_OTHER): Payer: Medicaid Other

## 2023-01-19 LAB — CBC WITH DIFFERENTIAL/PLATELET
Abs Immature Granulocytes: 0.07 10*3/uL (ref 0.00–0.07)
Basophils Absolute: 0.1 10*3/uL (ref 0.0–0.1)
Basophils Relative: 1 %
Eosinophils Absolute: 0.4 10*3/uL (ref 0.0–0.5)
Eosinophils Relative: 3 %
HCT: 31.8 % — ABNORMAL LOW (ref 36.0–46.0)
Hemoglobin: 10.1 g/dL — ABNORMAL LOW (ref 12.0–15.0)
Immature Granulocytes: 1 %
Lymphocytes Relative: 18 %
Lymphs Abs: 2.4 10*3/uL (ref 0.7–4.0)
MCH: 28.5 pg (ref 26.0–34.0)
MCHC: 31.8 g/dL (ref 30.0–36.0)
MCV: 89.6 fL (ref 80.0–100.0)
Monocytes Absolute: 0.7 10*3/uL (ref 0.1–1.0)
Monocytes Relative: 5 %
Neutro Abs: 10.3 10*3/uL — ABNORMAL HIGH (ref 1.7–7.7)
Neutrophils Relative %: 72 %
Platelets: 306 10*3/uL (ref 150–400)
RBC: 3.55 MIL/uL — ABNORMAL LOW (ref 3.87–5.11)
RDW: 13.7 % (ref 11.5–15.5)
WBC: 13.9 10*3/uL — ABNORMAL HIGH (ref 4.0–10.5)
nRBC: 0 % (ref 0.0–0.2)

## 2023-01-19 LAB — HCG, SERUM, QUALITATIVE: Preg, Serum: NEGATIVE

## 2023-01-19 LAB — BASIC METABOLIC PANEL
Anion gap: 10 (ref 5–15)
BUN: 21 mg/dL — ABNORMAL HIGH (ref 6–20)
CO2: 22 mmol/L (ref 22–32)
Calcium: 8.8 mg/dL — ABNORMAL LOW (ref 8.9–10.3)
Chloride: 105 mmol/L (ref 98–111)
Creatinine, Ser: 1.91 mg/dL — ABNORMAL HIGH (ref 0.44–1.00)
GFR, Estimated: 36 mL/min — ABNORMAL LOW (ref >60)
Glucose, Bld: 86 mg/dL (ref 70–99)
Potassium: 4.1 mmol/L (ref 3.5–5.1)
Sodium: 137 mmol/L (ref 135–145)

## 2023-01-19 LAB — MONONUCLEOSIS SCREEN: Mono Screen: NEGATIVE

## 2023-01-19 MED ORDER — ACETAMINOPHEN 325 MG PO TABS
650.0000 mg | ORAL_TABLET | Freq: Once | ORAL | Status: AC
  Administered 2023-01-19: 650 mg via ORAL
  Filled 2023-01-19: qty 2

## 2023-01-19 MED ORDER — CLINDAMYCIN HCL 300 MG PO CAPS: 300.0000 mg | ORAL_CAPSULE | Freq: Three times a day (TID) | ORAL | 0 refills | Status: AC

## 2023-01-19 NOTE — ED Provider Notes (Signed)
[place] EMERGENCY DEPARTMENT AT [facility] Provider Note   CSN: [id number] Arrival date & time: 01/18/23  2120     History  Chief Complaint  Patient presents with   Sore Throat    Hailey Thompson is a 27 y.o. female.  The history is provided by the patient.  Patient with history of migraines, thyroid disease, fibromyalgia, nephritis/CKD presents with sore throat. Patient reports she had a sore throat earlier in the month and was recently placed on amoxicillin which she just finished.  Since stopping the medication, her symptoms of sore throat have worsened.  She reports feeling feverish and having ear pain.  She has had mild cough. Patient reports she has been on CellCept was recently stopped due to concern for worsening her infection    Past Medical History:  Diagnosis Date   Fibromyalgia    Hypothyroidism    Kidney disease, chronic, stage III (moderate, EGFR 30-59 ml/min) (HCC)    Migraine    Thyroid disease    UTI (lower urinary tract infection)     Home Medications Prior to Admission medications   Medication Sig Start Date End Date Taking? Authorizing Provider  clindamycin (CLEOCIN) 300 MG capsule Take 1 capsule (300 mg total) by mouth 3 (three) times daily. X 7 days 01/19/23  Yes Zadie Rhine, MD  ARIPiprazole (ABILIFY) 15 MG tablet Take 15 mg by mouth daily. Patient not taking: Reported on 10/07/2022 03/30/22   [provider]  Cholecalciferol ([employer]S D3) 125 MCG (5000 UT) capsule TAKE 1 CAPSULE (5,000 UNITS TOTAL) BY MOUTH EVERY OTHER DAY FOR 90 DAYS 03/30/22   Nida, Denman George, MD  lumateperone tosylate (CAPLYTA) 42 MG capsule Take one capsule by mouth at bedtime. Patient not taking: Reported on 10/07/2022 07/11/22     lumateperone tosylate (CAPLYTA) 42 MG capsule Take 1 capsule (42 mg total) by mouth at bedtime. Patient not taking: Reported on 10/07/2022 07/26/22     lumateperone tosylate (CAPLYTA) 42 MG capsule Take 1 capsule (42 mg total) by  mouth at bedtime. Patient not taking: Reported on 10/07/2022 07/27/22     oxyCODONE-acetaminophen (PERCOCET/ROXICET) 5-325 MG tablet Take 1 tablet by mouth every 6 (six) hours as needed for up to 8 doses for severe pain. Patient not taking: Reported on 10/07/2022 07/28/22   Jeanelle Malling, PA  Pediatric Multiple Vitamins (FLINSTONES GUMMIES OMEGA-3 DHA PO) Take 1 tablet by mouth daily.    [provider]  promethazine (PHENERGAN) 25 MG tablet Take 1 tablet (25 mg total) by mouth every 6 (six) hours as needed for nausea or vomiting. 07/28/22   Jeanelle Malling, PA  propranolol (INDERAL) 10 MG tablet Take 10 mg by mouth 2 (two) times daily as needed. Patient not taking: Reported on 10/07/2022 03/30/22   [provider]  sertraline (ZOLOFT) 50 MG tablet Take 50 mg by mouth daily. 09/21/22   [provider]  SYNTHROID 88 MCG tablet Take 1 tablet (88 mcg total) by mouth daily before breakfast. 10/07/22   Nida, Denman George, MD  tolterodine (DETROL LA) 4 MG 24 hr capsule Take 4 mg by mouth daily. Patient not taking: Reported on 10/07/2022 03/11/22   [provider]  famotidine (PEPCID) 20 MG tablet Take 1 tablet (20 mg total) by mouth 2 (two) times daily. 06/11/14 06/11/14  Donnetta Hutching, MD      Allergies    Oxycodone, Prednisone, Versed [midazolam], and Zofran [ondansetron hcl]    Review of Systems   Review of Systems  Constitutional:  Positive for fatigue and fever.  HENT:  Positive for sore throat. Negative for drooling.     Physical Exam Updated Vital Signs BP 98/74   Pulse 72   Temp 99.3 F (37.4 C) (Oral)   Resp 18   SpO2 100%  Physical Exam CONSTITUTIONAL: Well developed/well nourished HEAD: Normocephalic/atraumatic EYES: EOMI/PERRL ENMT: Mucous membranes moist, uvula midline, tonsils are enlarged with erythema bilaterally, no drooling, no stridor.  No angioedema.  No dysphonia NECK: supple no meningeal signs CV: S1/S2 noted, no murmurs/rubs/gallops noted LUNGS: Lungs  are clear to auscultation bilaterally, no apparent distress NEURO: Pt is awake/alert/appropriate, moves all extremitiesx4.  No facial droop.   SKIN: warm, color normal PSYCH: Anxious  ED Results / Procedures / Treatments   Labs (all labs ordered are listed, but only abnormal results are displayed) Labs Reviewed  CBC WITH DIFFERENTIAL/PLATELET - Abnormal; Notable for the following components:      Result Value   WBC 13.9 (*)    RBC 3.55 (*)    Hemoglobin 10.1 (*)    HCT 31.8 (*)    Neutro Abs 10.3 (*)    All other components within normal limits  BASIC METABOLIC PANEL - Abnormal; Notable for the following components:   BUN 21 (*)    Creatinine, Ser 1.91 (*)    Calcium 8.8 (*)    GFR, Estimated 36 (*)    All other components within normal limits  MONONUCLEOSIS SCREEN  HCG, SERUM, QUALITATIVE    EKG None  Radiology CT Soft Tissue Neck Wo Contrast  Result Date: 01/19/2023 CLINICAL DATA:  Sore throat EXAM: CT NECK WITHOUT CONTRAST TECHNIQUE: Multidetector CT imaging of the neck was performed following the standard protocol without intravenous contrast. RADIATION DOSE REDUCTION: This exam was performed according to the departmental dose-optimization program which includes automated exposure control, adjustment of the mA and/or kV according to patient size and/or use of iterative reconstruction technique. COMPARISON:  None Available. FINDINGS: PHARYNX AND LARYNX: The nasopharynx, oropharynx and larynx are normal. Visible portions of the oral cavity, tongue base and floor of mouth are normal. Normal epiglottis, vallecula and pyriform sinuses. The larynx is normal. No retropharyngeal abscess, effusion or lymphadenopathy. SALIVARY GLANDS: Normal parotid, submandibular and sublingual glands. THYROID: Small or surgically absent. LYMPH NODES: No enlarged or abnormal density lymph nodes. VASCULAR: Negative LIMITED INTRACRANIAL: Normal. VISUALIZED ORBITS: Normal. MASTOIDS AND VISUALIZED PARANASAL  SINUSES: No fluid levels or advanced mucosal thickening. No mastoid effusion. SKELETON: No bony spinal canal stenosis. No lytic or blastic lesions. UPPER CHEST: Clear. OTHER: None. IMPRESSION: No acute abnormality of the neck. Electronically Signed   By: Deatra Robinson M.D.   On: 01/19/2023 03:15    Procedures Procedures    Medications Ordered in ED Medications  acetaminophen (TYLENOL) tablet 650 mg (650 mg Oral Given 01/19/23 0117)    ED Course/ Medical Decision Making/ A&P Clinical Course as of 01/19/23 0347  Thu Jan 19, 2023  0141 WBC(!): 13.9 Leukocytosis [DW]  0141 Patient with multiple chronic medical additions presents with ongoing sore throat.  This episode worsened with cessation of the recent amoxicillin.  Overall patient is well-appearing, but due to persistent symptoms and underlying treatment with immunosuppression meds, will proceed with labs and imaging.  Since patient has renal disease, CT imaging will be performed without contrast [DW]    Clinical Course User Index [DW] Zadie Rhine, MD  Medical Decision Making Amount and/or Complexity of Data Reviewed Labs: ordered. Decision-making details documented in ED Course. Radiology: ordered.  Risk OTC drugs. Prescription drug management.   This patient presents to the ED for concern of sore throat, this involves an extensive number of treatment options, and is a complaint that carries with it a high risk of complications and morbidity.  The differential diagnosis includes but is not limited to strep pharyngitis/tonsillitis, viral pharyngitis, mononucleosis, peritonsillar abscess, retropharyngeal abscess, epiglottitis  Comorbidities that complicate the patient evaluation: Patient's presentation is complicated by their history of chronic kidney disease  Additional history obtained: Additional history obtained from spouse Records reviewed Care Everywhere/External Records  Lab  Tests: I Ordered, and personally interpreted labs.  The pertinent results include: Leukocytosis, renal insufficiency  Imaging Studies ordered: I ordered imaging studies including CT scan soft tissue neck   I independently visualized and interpreted imaging which showed no acute findings I agree with the radiologist interpretation   Medicines ordered and prescription drug management: I ordered medication including Tylenol for pain Reevaluation of the patient after these medicines showed that the patient    improved   Reevaluation: After the interventions noted above, I reevaluated the patient and found that they have :improved  Complexity of problems addressed: Patient's presentation is most consistent with  acute presentation with potential threat to life or bodily function  Disposition: After consideration of the diagnostic results and the patient's response to treatment,  I feel that the patent would benefit from discharge   .    No signs of deep space infection on CT imaging.  Patient is handling her secretions well.  Will trial a new antibiotic-clindamycin She already has follow-up with otolaryngology next week        Final Clinical Impression(s) / ED Diagnoses Final diagnoses:  Pharyngitis, unspecified etiology    Rx / DC Orders ED Discharge Orders          Ordered    clindamycin (CLEOCIN) 300 MG capsule  3 times daily        01/19/23 0323              Zadie Rhine, MD 01/19/23 [phone]

## 2023-01-19 NOTE — ED Notes (Signed)
Patient transported to CT 

## 2023-01-21 ENCOUNTER — Other Ambulatory Visit: Payer: Self-pay | Admitting: "Endocrinology

## 2023-03-13 DIAGNOSIS — N184 Chronic kidney disease, stage 4 (severe): Secondary | ICD-10-CM | POA: Insufficient documentation

## 2023-03-17 ENCOUNTER — Emergency Department (HOSPITAL_COMMUNITY): Payer: Medicaid Other

## 2023-03-17 ENCOUNTER — Encounter (HOSPITAL_COMMUNITY): Payer: Self-pay | Admitting: Emergency Medicine

## 2023-03-17 ENCOUNTER — Emergency Department (HOSPITAL_COMMUNITY)
Admission: EM | Admit: 2023-03-17 | Discharge: 2023-03-17 | Disposition: A | Discharge status: Home / Self Care | Payer: Medicaid Other | Attending: Emergency Medicine | Admitting: Emergency Medicine

## 2023-03-17 ENCOUNTER — Other Ambulatory Visit: Payer: Self-pay

## 2023-03-17 DIAGNOSIS — R0789 Other chest pain: Secondary | ICD-10-CM | POA: Diagnosis not present

## 2023-03-17 DIAGNOSIS — R079 Chest pain, unspecified: Secondary | ICD-10-CM | POA: Diagnosis present

## 2023-03-17 LAB — CBC
HCT: 30.1 % — ABNORMAL LOW (ref 36.0–46.0)
Hemoglobin: 9.3 g/dL — ABNORMAL LOW (ref 12.0–15.0)
MCH: 28.3 pg (ref 26.0–34.0)
MCHC: 30.9 g/dL (ref 30.0–36.0)
MCV: 91.5 fL (ref 80.0–100.0)
Platelets: 295 10*3/uL (ref 150–400)
RBC: 3.29 MIL/uL — ABNORMAL LOW (ref 3.87–5.11)
RDW: 13.7 % (ref 11.5–15.5)
WBC: 12 10*3/uL — ABNORMAL HIGH (ref 4.0–10.5)
nRBC: 0 % (ref 0.0–0.2)

## 2023-03-17 LAB — BASIC METABOLIC PANEL
Anion gap: 10 (ref 5–15)
BUN: 19 mg/dL (ref 6–20)
CO2: 16 mmol/L — ABNORMAL LOW (ref 22–32)
Calcium: 8.4 mg/dL — ABNORMAL LOW (ref 8.9–10.3)
Chloride: 109 mmol/L (ref 98–111)
Creatinine, Ser: 1.63 mg/dL — ABNORMAL HIGH (ref 0.44–1.00)
GFR, Estimated: 44 mL/min — ABNORMAL LOW (ref >60)
Glucose, Bld: 89 mg/dL (ref 70–99)
Potassium: 4.7 mmol/L (ref 3.5–5.1)
Sodium: 135 mmol/L (ref 135–145)

## 2023-03-17 LAB — TROPONIN I (HIGH SENSITIVITY)
Troponin I (High Sensitivity): 2 ng/L (ref <18)
Troponin I (High Sensitivity): <2 ng/L (ref <18)

## 2023-03-17 LAB — HCG, SERUM, QUALITATIVE: Preg, Serum: NEGATIVE

## 2023-03-17 LAB — D-DIMER, QUANTITATIVE: D-Dimer, Quant: 0.34 ug{FEU}/mL (ref 0.00–0.50)

## 2023-03-17 MED ORDER — ALUM & MAG HYDROXIDE-SIMETH 200-200-20 MG/5ML PO SUSP
30.0000 mL | Freq: Once | ORAL | Status: AC
  Administered 2023-03-17: 30 mL via ORAL
  Filled 2023-03-17: qty 30

## 2023-03-17 NOTE — ED Provider Triage Note (Signed)
Emergency Medicine Provider Triage Evaluation Note  Hailey Thompson , a 27 y.o. female  was evaluated in triage.  Pt complains of chest pain.  Review of Systems  Positive: Chest pain Negative: diaphoresis  Physical Exam  BP 98/62 (BP Location: Left Arm)   Pulse 95   Temp 98.4 F (36.9 C) (Oral)   Resp 20   SpO2 100%  Gen:   Awake, no distress   Resp:  Normal effort  MSK:   Moves extremities without difficulty  Other:    Medical Decision Making  Medically screening exam initiated at 4:34 PM.  Appropriate orders placed.  Hailey Thompson was informed that the remainder of the evaluation will be completed by another provider, this initial triage assessment does not replace that evaluation, and the importance of remaining in the ED until their evaluation is complete.  Patient with chest pain going on for about 3 to 4 weeks.  She thinks it is probably indigestion.  Had episode today when she was walking into the kitchen or got suddenly worse and she got diaphoretic.  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  Otherwise likely would require delta troponin.  EKG not obviously ischemic.   Melene Plan, DO 03/17/23 1635

## 2023-03-17 NOTE — ED Provider Notes (Signed)
[place] EMERGENCY DEPARTMENT AT [facility] Provider Note   CSN: [id number] Arrival date & time: 03/17/23  1531     History  No chief complaint on file.   Hailey Thompson is a 27 y.o. female.  27 yo F with a chief complaints of chest pain.  She has had this off and on.  Feels a bit like a burning up to her throat.  Nothing seems to make it better or worse.  Denies exertional symptoms.  She received some nitro en route and thinks it helped a little bit.  She has been taking omeprazole.  No fevers no vomiting.        Home Medications Prior to Admission medications   Medication Sig Start Date End Date Taking? Authorizing Provider  ARIPiprazole (ABILIFY) 15 MG tablet Take 15 mg by mouth daily. Patient not taking: Reported on 10/07/2022 03/30/22   [provider]  Cholecalciferol ([facility] D3) 125 MCG (5000 UT) capsule TAKE 1 CAPSULE (5,000 UNITS TOTAL) BY MOUTH EVERY OTHER DAY FOR 90 DAYS 03/30/22   Nida, Denman George, MD  clindamycin (CLEOCIN) 300 MG capsule Take 1 capsule (300 mg total) by mouth 3 (three) times daily. X 7 days 01/19/23   Zadie Rhine, MD  lumateperone tosylate (CAPLYTA) 42 MG capsule Take one capsule by mouth at bedtime. Patient not taking: Reported on 10/07/2022 07/11/22     lumateperone tosylate (CAPLYTA) 42 MG capsule Take 1 capsule (42 mg total) by mouth at bedtime. Patient not taking: Reported on 10/07/2022 07/26/22     lumateperone tosylate (CAPLYTA) 42 MG capsule Take 1 capsule (42 mg total) by mouth at bedtime. Patient not taking: Reported on 10/07/2022 07/27/22     oxyCODONE-acetaminophen (PERCOCET/ROXICET) 5-325 MG tablet Take 1 tablet by mouth every 6 (six) hours as needed for up to 8 doses for severe pain. Patient not taking: Reported on 10/07/2022 07/28/22   Jeanelle Malling, PA  Pediatric Multiple Vitamins (FLINSTONES GUMMIES OMEGA-3 DHA PO) Take 1 tablet by mouth daily.    [provider]  promethazine (PHENERGAN) 25 MG tablet Take 1 tablet  (25 mg total) by mouth every 6 (six) hours as needed for nausea or vomiting. 07/28/22   Jeanelle Malling, PA  propranolol (INDERAL) 10 MG tablet Take 10 mg by mouth 2 (two) times daily as needed. Patient not taking: Reported on 10/07/2022 03/30/22   [provider]  sertraline (ZOLOFT) 50 MG tablet Take 50 mg by mouth daily. 09/21/22   [provider]  SYNTHROID 88 MCG tablet TAKE 1 TABLET BY MOUTH DAILY BEFORE BREAKFAST. 01/23/23   Roma Kayser, MD  tolterodine (DETROL LA) 4 MG 24 hr capsule Take 4 mg by mouth daily. Patient not taking: Reported on 10/07/2022 03/11/22   [provider]  famotidine (PEPCID) 20 MG tablet Take 1 tablet (20 mg total) by mouth 2 (two) times daily. 06/11/14 06/11/14  Donnetta Hutching, MD      Allergies    Oxycodone, Prednisone, Versed [midazolam], and Zofran [ondansetron hcl]    Review of Systems   Review of Systems  Physical Exam Updated Vital Signs BP 108/75 (BP Location: Left Arm)   Pulse 86   Temp 98.3 F (36.8 C) (Oral)   Resp 18   SpO2 100%  Physical Exam Vitals and nursing note reviewed.  Constitutional:      General: She is not in acute distress.    Appearance: She is well-developed. She is not diaphoretic.  HENT:     Head: Normocephalic  and atraumatic.  Eyes:     Pupils: Pupils are equal, round, and reactive to light.  Cardiovascular:     Rate and Rhythm: Normal rate and regular rhythm.     Heart sounds: No murmur heard.    No friction rub. No gallop.  Pulmonary:     Effort: Pulmonary effort is normal.     Breath sounds: No wheezing or rales.  Abdominal:     General: There is no distension.     Palpations: Abdomen is soft.     Tenderness: There is no abdominal tenderness.  Musculoskeletal:        General: No tenderness.     Cervical back: Normal range of motion and neck supple.  Skin:    General: Skin is warm and dry.  Neurological:     Mental Status: She is alert and oriented to person, place, and time.   Psychiatric:        Behavior: Behavior normal.     ED Results / Procedures / Treatments   Labs (all labs ordered are listed, but only abnormal results are displayed) Labs Reviewed  BASIC METABOLIC PANEL - Abnormal; Notable for the following components:      Result Value   CO2 16 (*)    Creatinine, Ser 1.63 (*)    Calcium 8.4 (*)    GFR, Estimated 44 (*)    All other components within normal limits  CBC - Abnormal; Notable for the following components:   WBC 12.0 (*)    RBC 3.29 (*)    Hemoglobin 9.3 (*)    HCT 30.1 (*)    All other components within normal limits  HCG, SERUM, QUALITATIVE  D-DIMER, QUANTITATIVE  TROPONIN I (HIGH SENSITIVITY)  TROPONIN I (HIGH SENSITIVITY)    EKG EKG Interpretation Date/Time:  Friday March 17 2023 15:49:24 EST Ventricular Rate:  79 PR Interval:  116 QRS Duration:  78 QT Interval:  368 QTC Calculation: 421 R Axis:   53  Text Interpretation: Normal sinus rhythm with sinus arrhythmia Normal ECG No significant change since last tracing Confirmed by Melene Plan [phone]) on 03/17/2023 7:49:30 PM  Radiology DG Chest 2 View Result Date: 03/17/2023 CLINICAL DATA:  Chest pain EXAM: CHEST - 2 VIEW COMPARISON:  Chest radiograph 07/06/2022 FINDINGS: The heart size and mediastinal contours are within normal limits. Both lungs are clear. The visualized skeletal structures are unremarkable. IMPRESSION: No active cardiopulmonary disease. Electronically Signed   By: Annia Belt M.D.   On: 03/17/2023 17:52    Procedures Procedures    Medications Ordered in ED Medications  alum & mag hydroxide-simeth (MAALOX/MYLANTA) 200-200-20 MG/5ML suspension 30 mL (30 mLs Oral Given 03/17/23 2036)    ED Course/ Medical Decision Making/ A&P                                 Medical Decision Making Amount and/or Complexity of Data Reviewed Labs: ordered. Radiology: ordered.  Risk OTC drugs.   27 yo F with a chief complaints of chest pain.  Atypical in  nature.  Going on for some time but suddenly worse just prior to arrival.  Will obtain 2 troponins.  She has a history of MGUS and so obtain a D-dimer.  D-dimer is negative.  Chest x-ray independently interpreted by me without focal infiltrate or pneumothorax.  Initial troponin is negative.  No acute anemia no significant electrolyte abnormalities.  Delta negative.  Will discharge home.  PCP follow-up.  9:23 PM:  I have discussed the diagnosis/risks/treatment options with the patient.  Evaluation and diagnostic testing in the emergency department does not suggest an emergent condition requiring admission or immediate intervention beyond what has been performed at this time.  They will follow up with PCP. We also discussed returning to the ED immediately if new or worsening sx occur. We discussed the sx which are most concerning (e.g., sudden worsening pain, fever, inability to tolerate by mouth) that necessitate immediate return. Medications administered to the patient during their visit and any new prescriptions provided to the patient are listed below.  Medications given during this visit Medications  alum & mag hydroxide-simeth (MAALOX/MYLANTA) 200-200-20 MG/5ML suspension 30 mL (30 mLs Oral Given 03/17/23 2036)     The patient appears reasonably screen and/or stabilized for discharge and I doubt any other medical condition or other [facility] requiring further screening, evaluation, or treatment in the ED at this time prior to discharge.          Final Clinical Impression(s) / ED Diagnoses Final diagnoses:  Nonspecific chest pain    Rx / DC Orders ED Discharge Orders     None         Melene Plan, DO 03/17/23 2123

## 2023-03-17 NOTE — ED Triage Notes (Signed)
Pt [facility] from home for sudden onset 8/10 substernal CP around 1430. Described as sharp, squeezing, burning. 2/10 after meds.  324 ASA, 2 SL nitroglycerin given by EMS  110/62 HR 86 99% RA  12- lead sinus  22G R. AC.

## 2023-03-17 NOTE — ED Notes (Signed)
Pt states that maalox helped with burning sensation in chest

## 2023-03-17 NOTE — ED Notes (Signed)
Patient transported to X-ray 

## 2023-03-17 NOTE — Discharge Instructions (Addendum)
Your markers for heart damage were both normal.  Your marker for a blood clot in the lung also was negative.  This makes it unlikely that you did not have a heart attack or blood clot in the lung.  Lets treat this like it is possibly indigestion.  As we discussed it may be helpful not to eat or drink anything including water 4 hours before you lay down to go to bed.    Try to avoid things that may make this worse, most commonly these are spicy foods tomato based products fatty foods chocolate and peppermint.  Alcohol and tobacco can also make this worse.  Return to the emergency department for sudden worsening pain fever or inability to eat or drink.

## 2023-04-12 ENCOUNTER — Other Ambulatory Visit: Payer: Self-pay | Admitting: "Endocrinology

## 2023-04-12 DIAGNOSIS — E559 Vitamin D deficiency, unspecified: Secondary | ICD-10-CM

## 2023-04-12 DIAGNOSIS — R5383 Other fatigue: Secondary | ICD-10-CM

## 2023-04-12 DIAGNOSIS — E538 Deficiency of other specified B group vitamins: Secondary | ICD-10-CM

## 2023-04-12 DIAGNOSIS — E063 Autoimmune thyroiditis: Secondary | ICD-10-CM

## 2023-04-12 DIAGNOSIS — E042 Nontoxic multinodular goiter: Secondary | ICD-10-CM

## 2023-04-16 LAB — CBC WITH DIFFERENTIAL/PLATELET
Basophils Absolute: 0 10*3/uL (ref 0.0–0.2)
Basos: 0 %
EOS (ABSOLUTE): 0.3 10*3/uL (ref 0.0–0.4)
Eos: 2 %
Hematocrit: 31.4 % — ABNORMAL LOW (ref 34.0–46.6)
Hemoglobin: 10 g/dL — ABNORMAL LOW (ref 11.1–15.9)
Immature Grans (Abs): 0 10*3/uL (ref 0.0–0.1)
Immature Granulocytes: 0 %
Lymphocytes Absolute: 1.6 10*3/uL (ref 0.7–3.1)
Lymphs: 13 %
MCH: 29.2 pg (ref 26.6–33.0)
MCHC: 31.8 g/dL (ref 31.5–35.7)
MCV: 92 fL (ref 79–97)
Monocytes Absolute: 0.6 10*3/uL (ref 0.1–0.9)
Monocytes: 5 %
Neutrophils Absolute: 9.7 10*3/uL — ABNORMAL HIGH (ref 1.4–7.0)
Neutrophils: 80 %
Platelets: 279 10*3/uL (ref 150–450)
RBC: 3.43 x10E6/uL — ABNORMAL LOW (ref 3.77–5.28)
RDW: 14.2 % (ref 11.7–15.4)
WBC: 12.1 10*3/uL — ABNORMAL HIGH (ref 3.4–10.8)

## 2023-04-16 LAB — VITAMIN D 25 HYDROXY (VIT D DEFICIENCY, FRACTURES): Vit D, 25-Hydroxy: 27 ng/mL — ABNORMAL LOW (ref 30.0–100.0)

## 2023-04-16 LAB — CELIAC DISEASE COMPREHENSIVE PANEL WITH REFLEXES
IgA/Immunoglobulin A, Serum: 419 mg/dL — ABNORMAL HIGH (ref 87–352)
Transglutaminase IgA: <2 U/mL (ref 0–3)

## 2023-04-16 LAB — TSH: TSH: 7.72 u[IU]/mL — ABNORMAL HIGH (ref 0.450–4.500)

## 2023-04-16 LAB — T4, FREE: Free T4: 1.51 ng/dL (ref 0.82–1.77)

## 2023-04-16 LAB — VITAMIN B12: Vitamin B-12: 502 pg/mL (ref 232–1245)

## 2023-04-19 ENCOUNTER — Telehealth: Payer: Self-pay | Admitting: "Endocrinology

## 2023-04-19 ENCOUNTER — Ambulatory Visit: Payer: Medicaid Other | Admitting: "Endocrinology

## 2023-04-19 ENCOUNTER — Other Ambulatory Visit: Payer: Self-pay | Admitting: "Endocrinology

## 2023-04-19 MED ORDER — SYNTHROID 100 MCG PO TABS: 100.0000 ug | ORAL_TABLET | Freq: Every day | ORAL | 2 refills | Status: DC

## 2023-04-19 NOTE — Telephone Encounter (Signed)
Patient is asking if you could adjust dose due to her levels being elevated. Her appt is 1/28 with you but is almost out of medication. [facility] [address]

## 2023-04-21 ENCOUNTER — Ambulatory Visit: Payer: Medicaid Other | Admitting: "Endocrinology

## 2023-04-25 ENCOUNTER — Ambulatory Visit: Payer: Medicaid Other | Admitting: "Endocrinology

## 2023-04-25 ENCOUNTER — Encounter: Payer: Self-pay | Admitting: "Endocrinology

## 2023-05-10 ENCOUNTER — Ambulatory Visit: Payer: Medicaid Other | Admitting: "Endocrinology

## 2023-05-12 ENCOUNTER — Ambulatory Visit: Payer: Medicaid Other | Admitting: "Endocrinology

## 2023-06-02 ENCOUNTER — Other Ambulatory Visit: Payer: Self-pay | Admitting: "Endocrinology

## 2023-06-20 ENCOUNTER — Encounter: Payer: Self-pay | Admitting: "Endocrinology

## 2023-06-20 ENCOUNTER — Ambulatory Visit: Admitting: "Endocrinology

## 2023-06-20 VITALS — BP 90/52 | HR 76 | Ht <= 58 in | Wt 185.0 lb

## 2023-06-20 DIAGNOSIS — E559 Vitamin D deficiency, unspecified: Secondary | ICD-10-CM | POA: Diagnosis not present

## 2023-06-20 DIAGNOSIS — R768 Other specified abnormal immunological findings in serum: Secondary | ICD-10-CM | POA: Insufficient documentation

## 2023-06-20 DIAGNOSIS — E063 Autoimmune thyroiditis: Secondary | ICD-10-CM | POA: Diagnosis not present

## 2023-06-20 DIAGNOSIS — N946 Dysmenorrhea, unspecified: Secondary | ICD-10-CM | POA: Insufficient documentation

## 2023-06-20 DIAGNOSIS — E042 Nontoxic multinodular goiter: Secondary | ICD-10-CM

## 2023-06-20 NOTE — Progress Notes (Signed)
 06/20/2023           Endocrinology follow-up note   Subjective:    Patient ID: Hailey Thompson, female    DOB: 1996-03-03, PCP Jettie Pagan, NP   Past Medical History:  Diagnosis Date   Fibromyalgia    Hypothyroidism    Kidney disease, chronic, stage III (moderate, EGFR 30-59 ml/min) (HCC)    Migraine    Thyroid disease    UTI (lower urinary tract infection)    Past Surgical History:  Procedure Laterality Date   APPENDECTOMY     as a child   CESAREAN SECTION  2016   CESAREAN SECTION N/A 05/14/2019   Procedure: CESAREAN SECTION;  Surgeon: Tilda Burrow, MD;  Location: [facility] LD ORS;  Service: Obstetrics;  Laterality: N/A;   CHOLECYSTECTOMY N/A 11/06/2015   Procedure: LAPAROSCOPIC CHOLECYSTECTOMY;  Surgeon: Ancil Linsey, MD;  Location: [facility] ORS;  Service: General;  Laterality: N/A;   CYSTOSCOPY W/ DILATION OF BLADDER  03/11/2022   RENAL BIOPSY  08/02/2021   WISDOM TOOTH EXTRACTION     Social History   Socioeconomic History   Marital status: Married    Spouse name: dustin   Number of children: 1   Years of education: 12   Highest education level: 10th grade  Occupational History   Occupation: homemaker  Tobacco Use   Smoking status: Never    Passive exposure: Never   Smokeless tobacco: Never  Vaping Use   Vaping status: Never Used  Substance and Sexual Activity   Alcohol use: No   Drug use: No   Sexual activity: Not Currently    Birth control/protection: None  Other Topics Concern   Not on file  Social History Narrative   Not on file   Social Drivers of Health   Financial Resource Strain: Low Risk  (06/20/2023)   Received from [facility]   Overall Financial Resource Strain (CARDIA)    Difficulty of Paying Living Expenses: Not hard at all  Food Insecurity: No Food Insecurity (06/20/2023)   Received from [facility]   Hunger Vital Sign    Worried About Running Out of Food in the Last Year: Never true    Ran Out of Food in the Last Year: Never true   Transportation Needs: No Transportation Needs (06/20/2023)   Received from [facility] - Transportation    Lack of Transportation (Medical): No    Lack of Transportation (Non-Medical): No  Physical Activity: Insufficiently Active (06/20/2023)   Received from [facility]   Exercise Vital Sign    Days of Exercise per Week: 3 days    Minutes of Exercise per Session: 30 min  Stress: No Stress Concern Present (06/20/2023)   Received from [facility] - Occupational Stress Questionnaire    Feeling of Stress : Not at all  Social Connections: Socially Integrated (06/20/2023)   Received from [facility]   Social Network    How would you rate your social network (family, work, friends)?: Good participation with social networks   Outpatient Encounter Medications as of 06/20/2023  Medication Sig   FLUoxetine (PROZAC) 20 MG capsule Take 40 mg by mouth daily.   FLUoxetine (PROZAC) 40 MG capsule Take 40 mg by mouth daily.   Cholecalciferol ([facility] D3) 125 MCG (5000 UT) capsule TAKE 1 CAPSULE (5,000 UNITS TOTAL) BY MOUTH EVERY OTHER DAY FOR 90 DAYS   clindamycin (CLEOCIN) 300 MG capsule Take 1 capsule (300 mg total)  by mouth 3 (three) times daily. X 7 days   oxyCODONE-acetaminophen (PERCOCET/ROXICET) 5-325 MG tablet Take 1 tablet by mouth every 6 (six) hours as needed for up to 8 doses for severe pain. (Patient not taking: Reported on 10/07/2022)   Pediatric Multiple Vitamins (FLINSTONES GUMMIES OMEGA-3 DHA PO) Take 1 tablet by mouth daily.   promethazine (PHENERGAN) 25 MG tablet Take 1 tablet (25 mg total) by mouth every 6 (six) hours as needed for nausea or vomiting.   SYNTHROID 100 MCG tablet TAKE 1 TABLET BY MOUTH DAILY BEFORE BREAKFAST.   [DISCONTINUED] ARIPiprazole (ABILIFY) 15 MG tablet Take 15 mg by mouth daily. (Patient not taking: Reported on 10/07/2022)   [DISCONTINUED] famotidine (PEPCID) 20 MG tablet Take 1 tablet (20 mg total) by mouth 2  (two) times daily.   [DISCONTINUED] lumateperone tosylate (CAPLYTA) 42 MG capsule Take one capsule by mouth at bedtime. (Patient not taking: Reported on 10/07/2022)   [DISCONTINUED] lumateperone tosylate (CAPLYTA) 42 MG capsule Take 1 capsule (42 mg total) by mouth at bedtime. (Patient not taking: Reported on 10/07/2022)   [DISCONTINUED] lumateperone tosylate (CAPLYTA) 42 MG capsule Take 1 capsule (42 mg total) by mouth at bedtime. (Patient not taking: Reported on 10/07/2022)   [DISCONTINUED] propranolol (INDERAL) 10 MG tablet Take 10 mg by mouth 2 (two) times daily as needed. (Patient not taking: Reported on 10/07/2022)   [DISCONTINUED] sertraline (ZOLOFT) 50 MG tablet Take 50 mg by mouth daily.   [DISCONTINUED] tolterodine (DETROL LA) 4 MG 24 hr capsule Take 4 mg by mouth daily. (Patient not taking: Reported on 10/07/2022)   No facility-administered encounter medications on file as of 06/20/2023.   ALLERGIES: Allergies  Allergen Reactions   Oxycodone Diarrhea   Prednisone Other (See Comments)    hallucinations   Versed [Midazolam] Hives    Hives after IV administration of zofran and versed. Unsure which medication caused reaction   Zofran [Ondansetron Hcl] Hives    Hives after IV adminstration of zofran and versed. Unsure of which medication caused reaction.     VACCINATION STATUS: Immunization History  Administered Date(s) Administered   Influenza Whole 02/14/2011   Influenza,inj,Quad PF,6+ Mos 03/18/2019   Tdap 03/18/2019    HPI 28 year old female patient with medical history as above.  She is currently on levothyroxine for hypothyroidism induced by Hashimoto's thyroiditis.  Her levothyroxine dose was recently adjusted to 100 mcg p.o. daily before breakfast.  She presents with discordant thyroid function test with target free T4 and slightly above target TSH.    Patient is compliant and consistent taking her medication.    She also has history of vitamin D deficiency, and iron  deficiency anemia. -Her previsit labs also show positive celiac screen.  Recently, she was diagnosed with CKD, following with the nephrology group. -he denies tremors, heat intolerance.   She denies palpitations.  She presents with 15 pounds of weight loss since August 2024. Due to neck pain, she underwent MRI of the spine which incidentally showed multiple thyroid nodules.  She was to know more information about her thyroid anatomy.  - She reports that she was diagnosed with hypothyroidism at age 27 years, treated with various doses of levothyroxine over the years.   - She has family history of hypothyroidism in her mother. She denies family history of thyroid cancer. She denies any personal history of goiter.   Review of Systems Limited as above.    Objective:    BP (!) 90/52   Pulse 76   Ht 4'  6" (1.372 m)   Wt 185 lb (83.9 kg)   BMI 44.61 kg/m   Wt Readings from Last 3 Encounters:  06/20/23 185 lb (83.9 kg)  11/07/22 200 lb (90.7 kg)  10/07/22 200 lb 6.4 oz (90.9 kg)    Physical Exam- Limited   CMP     Component Value Date/Time   NA 135 03/17/2023 1540   NA 142 03/30/2022 1426   K 4.7 03/17/2023 1540   CL 109 03/17/2023 1540   CO2 16 (L) 03/17/2023 1540   GLUCOSE 89 03/17/2023 1540   BUN 19 03/17/2023 1540   BUN 19 03/30/2022 1426   CREATININE 1.63 (H) 03/17/2023 1540   CREATININE 0.56 07/23/2013 0815   CALCIUM 8.4 (L) 03/17/2023 1540   CALCIUM 8.9 07/19/2011 1547   PROT 7.8 11/07/2022 1333   PROT 7.2 03/30/2022 1426   ALBUMIN 4.1 11/07/2022 1333   ALBUMIN 3.8 (L) 03/30/2022 1426   AST 19 11/07/2022 1333   ALT 16 11/07/2022 1333   ALKPHOS 101 11/07/2022 1333   BILITOT 0.3 11/07/2022 1333   BILITOT 0.2 03/30/2022 1426   GFRNONAA 44 (L) 03/17/2023 1540   GFRAA >60 05/14/2019 1222     Diabetic Labs (most recent): Lab Results  Component Value Date   HGBA1C 5.0 02/04/2017   HGBA1C 5.1 07/16/2012   HGBA1C 5.4 10/25/2011     Lipid Panel ( most  recent) Lipid Panel     Component Value Date/Time   CHOL 156 07/23/2013 0815   TRIG 131 07/23/2013 0815   HDL 42 07/23/2013 0815   CHOLHDL 3.7 07/23/2013 0815   VLDL 26 07/23/2013 0815   LDLCALC 88 07/23/2013 0815   Recent Results (from the past 2160 hours)  Vitamin B12     Status: None   Collection Time: 04/12/23  4:25 PM  Result Value Ref Range   Vitamin B-12 502 232 - 1,245 pg/mL  VITAMIN D 25 Hydroxy (Vit-D Deficiency, Fractures)     Status: Abnormal   Collection Time: 04/12/23  4:25 PM  Result Value Ref Range   Vit D, 25-Hydroxy 27.0 (L) 30.0 - 100.0 ng/mL    Comment: Vitamin D deficiency has been defined by the Institute of Medicine and an Endocrine Society practice guideline as a level of serum 25-OH vitamin D less than 20 ng/mL (1,2). The Endocrine Society went on to further define vitamin D insufficiency as a level between 21 and 29 ng/mL (2). 1. IOM (Institute of Medicine). 2010. Dietary reference    intakes for calcium and D. [place]: The    [employer]. 2. Holick MF, [place], Bischoff-Ferrari HA, et al.    Evaluation, treatment, and prevention of vitamin D    deficiency: an Endocrine Society clinical practice    guideline. JCEM. 2011 Jul; 96(7):1911-30.   CBC with Differential     Status: Abnormal   Collection Time: 04/12/23  4:25 PM  Result Value Ref Range   WBC 12.1 (H) 3.4 - 10.8 x10E3/uL   RBC 3.43 (L) 3.77 - 5.28 x10E6/uL   Hemoglobin 10.0 (L) 11.1 - 15.9 g/dL   Hematocrit 16.1 (L) 09.6 - 46.6 %   MCV 92 79 - 97 fL   MCH 29.2 26.6 - 33.0 pg   MCHC 31.8 31.5 - 35.7 g/dL   RDW 04.5 40.9 - 81.1 %   Platelets 279 150 - 450 x10E3/uL   Neutrophils 80 Not Estab. %   Lymphs 13 Not Estab. %   Monocytes 5 Not Estab. %  Eos 2 Not Estab. %   Basos 0 Not Estab. %   Neutrophils Absolute 9.7 (H) 1.4 - 7.0 x10E3/uL   Lymphocytes Absolute 1.6 0.7 - 3.1 x10E3/uL   Monocytes Absolute 0.6 0.1 - 0.9 x10E3/uL   EOS (ABSOLUTE) 0.3 0.0 - 0.4  x10E3/uL   Basophils Absolute 0.0 0.0 - 0.2 x10E3/uL   Immature Granulocytes 0 Not Estab. %   Immature Grans (Abs) 0.0 0.0 - 0.1 x10E3/uL  Celiac Disease Comprehensive Panel with Reflexes     Status: Abnormal   Collection Time: 04/12/23  4:25 PM  Result Value Ref Range   Transglutaminase IgA <2 0 - 3 U/mL    Comment:                               Negative        0 -  3                               Weak Positive   4 - 10                               Positive           >10  Tissue Transglutaminase (tTG) has been identified  as the endomysial antigen.  Studies have demonstr-  ated that endomysial IgA antibodies have over 99%  specificity for gluten sensitive enteropathy.    IgA/Immunoglobulin A, Serum 419 (H) 87 - 352 mg/dL  T4, Free     Status: None   Collection Time: 04/12/23  4:25 PM  Result Value Ref Range   Free T4 1.51 0.82 - 1.77 ng/dL  TSH     Status: Abnormal   Collection Time: 04/12/23  4:25 PM  Result Value Ref Range   TSH 7.720 (H) 0.450 - 4.500 uIU/mL     Assessment & Plan:   1. Hypothyroidism due to Hashimoto's thyroiditis 2.  Multiple thyroid nodules 3.  Vitamin D deficiency 4.  Positive celiac screen -Her previsit thyroid function tests are discordant, however would benefit from staying on her current dose of Synthroid at 100 mcg p.o. daily before breakfast.    - We discussed about the correct intake of her thyroid hormone, on empty stomach at fasting, with water, separated by at least 30 minutes from breakfast and other medications,  and separated by more than 4 hours from calcium, iron, multivitamins, acid reflux medications (PPIs). -Patient is made aware of the fact that thyroid hormone replacement is needed for life, dose to be adjusted by periodic monitoring of thyroid function tests.   -Her vitamin D has corrected at 66, this is improving from 17.  She is advised to continue maintenance by taking vitamin D3 5000 units every other day.  She is advised to  continue her close follow-up with nephrology.    In light of her recent MRI showing multiple thyroid nodules, her subsequent dedicated thyroid ultrasound did not show any nodular lesions, showed shrunk thyroid consistent with Hashimoto's thyroiditis.    -I discussed dietary approach for potential celiac sprue diet advising limiting gluten rich food items such as wheat, barley, rye, and oats. -She does not have diabetes/prediabetes, her recent A1c was 5%.    She is advised to continue close follow-up with her PCP [facility], PA-C.   I spent  26  minutes in the care of the patient today including review of labs from Thyroid Function, CMP, and other relevant labs ; imaging/biopsy records (current and previous including abstractions from other facilities); face-to-face time discussing  her lab results and symptoms, medications doses, her options of short and long term treatment based on the latest standards of care / guidelines;   and documenting the encounter.  Hailey Thompson  participated in the discussions, expressed understanding, and voiced agreement with the above plans.  All questions were answered to her satisfaction. she is encouraged to contact clinic should she have any questions or concerns prior to her return visit.   Follow up plan: Return in about 6 months (around 12/21/2023) for Fasting Labs  in AM B4 8.  Marquis Lunch, MD Phone: [phone]  Fax: [phone]  -  This note was partially dictated with voice recognition software. Similar sounding words can be transcribed inadequately or may not  be corrected upon review.  06/20/2023, 4:50 PM

## 2023-06-22 ENCOUNTER — Ambulatory Visit: Payer: Medicaid Other | Admitting: "Endocrinology

## 2023-08-01 DIAGNOSIS — Z1589 Genetic susceptibility to other disease: Secondary | ICD-10-CM | POA: Insufficient documentation

## 2023-08-13 ENCOUNTER — Other Ambulatory Visit: Payer: Self-pay | Admitting: "Endocrinology

## 2023-08-17 ENCOUNTER — Ambulatory Visit: Admitting: Genetic Counselor

## 2023-09-12 ENCOUNTER — Encounter: Payer: Self-pay | Admitting: "Endocrinology

## 2023-09-23 ENCOUNTER — Other Ambulatory Visit: Payer: Self-pay | Admitting: "Endocrinology

## 2023-09-26 ENCOUNTER — Ambulatory Visit: Admitting: Genetic Counselor

## 2023-10-19 ENCOUNTER — Other Ambulatory Visit: Payer: Self-pay | Admitting: "Endocrinology

## 2023-11-21 ENCOUNTER — Ambulatory Visit: Admitting: Genetic Counselor

## 2023-12-18 LAB — TSH: TSH: 1.18 (ref 0.41–5.90)

## 2023-12-22 ENCOUNTER — Ambulatory Visit: Admitting: "Endocrinology

## 2024-01-11 ENCOUNTER — Ambulatory Visit: Admitting: Medical Genetics

## 2024-01-11 DIAGNOSIS — Z1589 Genetic susceptibility to other disease: Secondary | ICD-10-CM

## 2024-01-11 DIAGNOSIS — E063 Autoimmune thyroiditis: Secondary | ICD-10-CM

## 2024-01-11 DIAGNOSIS — L80 Vitiligo: Secondary | ICD-10-CM

## 2024-01-11 DIAGNOSIS — G935 Compression of brain: Secondary | ICD-10-CM

## 2024-01-11 DIAGNOSIS — R6252 Short stature (child): Secondary | ICD-10-CM | POA: Diagnosis not present

## 2024-01-11 NOTE — Progress Notes (Signed)
 MEDICAL GENETICS NEW PATIENT EVALUATION  Patient name: Hailey Thompson DOB: 1995/09/08 Age: 28 y.o. MRN: [id number]  Referring Provider/Specialty: Duwaine Sage, MS, CGC  Date of Evaluation: 01/11/2024 Chief Complaint/Reason for Referral: Multiple medical concerns, COL4A3-related Alport syndrome  Assessment: We discussed with Hailey Thompson that it is most likely she has a multifactorial etiology to her various medical concerns. 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 However, a renal biopsy did not identify any evidence of Alport syndrome, so it has been unclear to her nephrologists as to the cause of her renal disease. Hailey Thompson also was found to have a pathogenic SDHA variant on more recent genetic testing performed due to a family history of cancer. It is unlikely that both of these genetic conditions explain her medical issues.   Therefore, appropriate testing at this time would include genome sequencing; this would simultaneously evaluate thousands of individual genes for smaller changes, including non-exonic regions, as well as the chromosomes for gains or losses of genetic material and repeat disorders. Hailey Thompson was interested in this being performed, and consent and samples were obtained for a trio (Hailey Thompson and her children) genome sequencing study through [facility]. The results are expected in 1-2 months, and we will contact her when they are available. Hailey Thompson should otherwise continue her care per her current providers' recommendations.  Recommendations: Trio genome sequencing through [facility] - results expected in 1-2 months. Continue follow up with current medical providers per their recommendations.  Follow up in genetics clinic will be based on the results of the testing.   HPI: Hailey Thompson is a 28 y.o. assigned female at birth who presents  today for an initial genetics evaluation for multiple medical concerns. She is accompanied by her spouse, who helped provide the history. This information, along with a review of pertinent records, labs, and radiology studies, is summarized below.  Hailey Thompson was referred to genetics for multiple medical concerns as noted below. She developed pain in her abdomen/back during the third trimester with her first pregnancy at 28yo. She has seen several providers since that time and no specific cause to her symptoms (hypothyroidism, kidney disease, joint/skin issues - apparent autoimmune disorder) have been identified. A gene panel was performed by her nephrologist in 2023 and identified a pathogenic COL4A3 variant (c.2747-1G>C; p.?), but she had a subsequent renal biopsy that did not identify any changes seen with COL4A3 abnormalities. Several other variants were identified on this testing, where she is either a carrier or has uncertain findings (see genetic counselor note for further details) that would be be considered relative to her current symptoms.  Hailey Thompson has a family history of breast cancer and was seen for genetic counseling and testing. She was found to have an SDHA gene variant (c.1753C>T; p.Arg585Trp), but also recommended to have further genetic evaluation and testing related to her other medical concerns.   Hailey Thompson was born premature in [place], [place]. She was in the NICU following her birth. She is unaware of any developmental concerns, but did have difficulty with taking exams when she was younger. She gained significant weight at around 28 years old, and testing at that time showed she had hypothyroidism. She has been on thyroid  medication since that time.  Past Medical History: Patient Active Problem List   Diagnosis Date Noted   Monoallelic mutation of SDHA gene 08/01/2023   Hypothyroidism  due to Hashimoto's thyroiditis 06/20/2023   Positive autoantibody screening for celiac disease 06/20/2023    Dysmenorrhea 06/20/2023   Stage 4 chronic kidney disease (HCC) 03/13/2023   FH: breast cancer in first degree relative 10/24/2022   Multiple thyroid  nodules 10/07/2022   MGUS (monoclonal gammopathy of unknown significance) 07/19/2022   Fibromyalgia 07/11/2022   Chiari malformation type I (HCC) 05/06/2022   Bipolar 1 disorder (HCC) 03/16/2022   Generalized anxiety disorder 03/15/2022   Chronic midline thoracic back pain 01/07/2022   Dyspnea 09/08/2021   Iron  deficiency anemia due to chronic blood loss 07/08/2019   Vitamin D  deficiency 07/08/2019   COVID-19 04/18/2019   History of cesarean section 09/26/2018   Other fatigue 01/24/2018   Vitiligo 10/25/2011   Short stature 07/19/2011   Hypothyroidism 07/19/2011   Obesity 07/19/2011   Hypercholesterolemia with hypertriglyceridemia 07/19/2011   Hashimoto's thyroiditis 05/18/2010   Developmental History: Milestones -- Unknown Therapies -- Unknown School -- Did not finish HS (11th grade), learning issues likely related to ADHD  Medications: Current Outpatient Medications on File Prior to Visit  Medication Sig Dispense Refill   Atogepant (QULIPTA) 60 MG TABS Take 60 mg by mouth daily.     busPIRone (BUSPAR) 5 MG tablet Take 5 mg by mouth 2 (two) times daily.     dicyclomine (BENTYL) 10 MG capsule Take 10 mg by mouth 3 (three) times daily before meals.     Vilazodone HCl (VIIBRYD) 40 MG TABS Take 40 mg by mouth daily.     Vitamin D , Ergocalciferol , (DRISDOL ) 1.25 MG (50000 UNIT) CAPS capsule Take 50,000 Units by mouth every 7 (seven) days.     WEGOVY 0.25 MG/0.5ML SOAJ SQ injection Inject 1 mg into the skin once a week.     clindamycin  (CLEOCIN ) 300 MG capsule Take 1 capsule (300 mg total) by mouth 3 (three) times daily. X 7 days 21 capsule 0   JORNAY PM 60 MG CP24 Take 1 capsule by mouth at bedtime.     oxyCODONE -acetaminophen  (PERCOCET/ROXICET) 5-325 MG tablet Take 1 tablet by mouth every 6 (six) hours as needed for up to 8 doses  for severe pain. (Patient not taking: Reported on 10/07/2022) 6 tablet 0   Pediatric Multiple Vitamins (FLINSTONES GUMMIES OMEGA-3 DHA PO) Take 1 tablet by mouth daily.     promethazine  (PHENERGAN ) 25 MG tablet Take 1 tablet (25 mg total) by mouth every 6 (six) hours as needed for nausea or vomiting. 30 tablet 0   SYNTHROID  100 MCG tablet TAKE 1 TABLET BY MOUTH EVERY DAY BEFORE BREAKFAST 90 tablet 1   [DISCONTINUED] famotidine  (PEPCID ) 20 MG tablet Take 1 tablet (20 mg total) by mouth 2 (two) times daily. 20 tablet 0   No current facility-administered medications on file prior to visit.   Allergies:  Allergies  Allergen Reactions   Oxycodone  Diarrhea   Prednisone Other (See Comments)    hallucinations   Versed  [Midazolam ] Hives    Hives after IV administration of zofran  and versed . Unsure which medication caused reaction   Zofran  [Ondansetron  Hcl] Hives    Hives after IV adminstration of zofran  and versed . Unsure of which medication caused reaction.    Review of Systems: Negative except as noted in the HPI  Family History: The family history was notable for the following: Son, 68 yo, alive and well. Daughter, 74 yo, alive and well. Increased weight gain over the past year.   Paternal Family History No information available about the paternal family history.  Ms.  Thompson is aware of two paternal half-brothers and one paternal half-sister but does not have knowledge of their health.   Maternal Family History Mother, deceased at 64 yo from breast cancer.  Also with diabetes and chronic kidney disease. Half-sister, deceased at 25 yo from chronic kidney disease and complications from COVID. Grandmother, deceased, with breast cancer and thyroidectomy. Two of her sisters with short stature.   Mother's ethnicity: Mixed European Father's ethnicity: Mixed European Consangunity: Denies Please see the Dentist note for additional information  Social History: Lives with spouse and  children in [place]  Vitals: Weight: 184.3 lb (94%) Height: 4'6 (<1%, -4 SD) Head circumference: 55.7 cm (73%)  Genetics Physical Exam:  Constitution: The patient is active and alert  Head: No abnormalities detected in: head, hairline, shape or size    Anterior fontanelle flat: not flat    Anterior fontanelle open: not open    Bitemporal narrowing: forehead not narrow    Frontal bossing: no frontal bossing    Macrocephaly: not macrocephalic    Microcephaly: not microcephalic    Plagiocephaly: not plagiocephalic  Face: No abnormalities detected in: shape  Eyes:    Upslanting palpebral fissure: upslanting palpebral fissure  Ears: No abnormalities detected in: ears    Low-set ears: ears not low set    Posteriorly rotated ears: ears not posteriorly rotated  Nose: No abnormalities detected in: nose, nasal bridge or nasal tip    Bulbous nasal tip: no prominent nasal tip    Columella below nares: no columella below nares    Depressed nasal bridge: no depressed nasal bridge    Flat nasal bridge: no flat nasal bridge    Hypoplastic alae nasi: nasal alae not underdeveloped     Upturned nasal tip: non-upturned nasal tip  Mouth: No abnormalities detected in: palate (comments: S/p braces for dental crowding, upturned corners of the mouth)  Neck: No abnormalities detected in: neck    Cysts: no cysts    Pits: no pits in neck    Redundant nuchal skin: no redundant neck skin    Webbing: no webbed neck  Chest: not assessed  Cardiac: No abnormalities detected in: cardiovascular system    Abnormal distal perfusion: normal distal perfusion    Irregular rate: heart rate regular    Irregular rhythm: regular rhythm    Murmur: no murmur  Lungs: No abnormalities detected in: pulmonary system, bilateral auscultation or effort  Abdomen: No abnormalities detected in: abdomen or appearance    Abnormal umbilicus: normal umbilicus    Diastasis recti: no diastasis recti     Distended abdomen: no distension    Hepatosplenomegaly: no hepatosplenomegaly    Umbilical hernia: no umbilical hernia  Spine: No abnormalities detected in: spine    Sacral anomalies: sacrum normal    Scoliosis: no scoliosis    Sacral dimple: no sacral dimple  Neurological: No abnormalities detected in: neurological system, deep tendon reflexes, antigravity movement of extremities, strength, facial movement or tone    Hypertonia: not hypertonic    Hypotonia: not hypotonic  Genitourinary: not assessed  Hair, Nails, and Skin: (comments: Brittle nails, soft skin)  Extremities: (comments: Proportionate short stature; increased flexibility of fingers, elbows, and knees)  Hands and Feet: No abnormalities detected in: distal extremities    Clinodactyly: no clinodactyly    Polydactyly: no polydactyly    Single palmar crease: multiple palmar creases    Syndactyly: no syndactyly   Photo of patient available (verbal consent obtained)   [place] Haldeman-Englert, MD [facility]/Genetics Date: 01/11/2024 Time: 1115   Total time spent: 80 minutes Time spent includes face to face and non-face to face care for the patient on the date of this encounter (history and physical, genetic counseling, coordination of care, data gathering and/or documentation as outlined).  Genetic counselor: Lum Molt, MS, [facility]

## 2024-01-11 NOTE — Progress Notes (Signed)
 GENETIC COUNSELING NEW PATIENT EVALUATION Patient name: Hailey Thompson DOB: 1996-03-20 Age: 28 y.o. MRN: [id number]  Referring Provider/Specialty: Duwaine A Mayers / Lyle Setters, NP (PCP) Date of Evaluation: 01/11/2024 Chief Complaint/Reason for Referral: COL4A3-related Alport syndrome, SDHA-related Pheochromocytoma and Paraganglioma (PPGL), short stature   Brief Summary: Hailey Thompson is a 28 y.o. female who presents today for an initial genetics evaluation for COL4A3-related Alport syndrome, SDHA-related Pheochromocytoma and Paraganglioma (PPGL), short stature. She is accompanied by her husband and two children at today's visit.  Prior genetic testing has been performed.   Hailey Thompson had a Scientist, research (life sciences) Panel that identified a likely pathogenic variant in the COL4A3 gene (c.2747-1G>C (p.?) associated with a diagnosis of dominant COL4A3-related Alport syndrome.  There were several other variants of uncertain significance and/or carrier status results on this panel that are detailed in Prior Genetic Testing, below.  Hailey Thompson also had genetic testing related to her family history of cancer which identified a pathogenic variant in the SDHA gene (c.1753C>T / p.Arg585Trp) associated with a diagnosis of SDHA-related Pheochromocytoma and Paraganglioma (PPGL).   Family History: See pedigree obtained during today's visit under History->Family->Pedigree.  The family history was notable for the following: Son, 73 yo, alive and well. Daughter, 59 yo, alive and well.  Paternal Family History No information available about the paternal family history.  Ms. Lingerfelt is aware of two paternal half-brothers and one paternal half-sister but does not have knowledge of their health.  Maternal Family History Mother, deceased at 31 yo from breast cancer.  Also with diabetes and chronic kidney disease. Half-sister, deceased at 49 yo from chronic kidney disease and complications from COVID. Grandmother,  deceased, with breast cancer and thyroidectomy. Two of her sisters with short stature.  Mother's ethnicity: Mixed European Father's ethnicity: Mixed European Consangunity: Denies   Prior Genetic testing: Hailey Thompson has previusy had a [employer] [facility] Panel and Hereditary Cancer Panel, please see table below for results.  Result Interpretation  SDHA c.1753C>T (p.Arg585Trp) Pathogenic variant associated with SDHA-related Pheochromocytoma and Paraganglioma syndrome.  COL4A3 c.2747-1G>C (p.?) Pathogenic variant associated with COL4A3-related Dominant Alport syndrome.  CEP290 c.181-2A>G (p.?) Pathogenic variant associated with carrier status for CEP290-related Ciliopathies  NPHS2 c.888G>A (p.Arg229Gln) Pathogenic variant associated with carrier status for Congenital Nephrotic syndrome.  NF1 c.3916C>A (p.Arg1306=) Variant of uncertain significance. Pathogenic variants are associated with Neurofibromatosis Type 1 (NF1) characterized by cafe au lait macules, neurofibromas, and learning disability.  Hailey Thompson does not have symptoms that clearly align with NF1.  This variant is unlikely to explain her symptoms.  KANSL1 c.741A>T (p.Arg247Ser) Mosaic variant of uncertain significance.  Pathogenic variants are associated with Noreen Estimable syndrome, characterized by developmental delay, intellectual disability, brain abnormalities, and epilepsy.  Hailey Thompson does not have symptoms that clearly align with Noreen Estimable syndrome. This variant is unlikely to explain her symptoms.  TSC2 c.5384G>A (p.Arg1795His) Variant of uncertain significance.  Pathogenic variants are associated with Tuberous Sclerosis Complex (TSC) characterized by angiomyolipomas, beingn tumors, dermatological findings, intellectual disability, and seizures. Hailey Thompson does not have symptoms that clearly align with TSC. This variant is unlikely to explain her symptoms.  LMNA c.23G>A (p.Arg8His) Variant of uncertain significance.  Pathogenic  variants are associated with LMNA-related disorders with significant extra-renal findings.  Hailey Thompson does not have symptoms that clearly align with LMNA-related conditions. This variant is unlikely to explain her symptoms.  OFD1 c.755C>T (p.Ala252Val) Variant of uncertain significance. Pathogenic variants are associated with Orofaciodigital syndrome characterized by ciliary dyskinesia and abnormalities of  the mouth face, and digits. Ms. Lalley does not have symptoms that clearly align with Orofaciodigital syndrome. This variant is unlikely to explain her symptoms.  NPHS1 c.1861G>T (p.Val621Leu) Variant of uncertain significance. Biallelic pathogenic variants are associated with Congenital Nephrotic syndrome.  The onset of Ms. Canelo's kidney disease occurred later than would be expected if this variant and the additional pathogenic variant in NPHS1 were causative for her symptoms.  This variant is unlikely to cause her symptoms.  COQ6 c.427G>A (p.Val143Met) Variant of uncertain significance.  Biallelic pathogenic variants are associated with Primary Coenzyme Q10 Deficiency 6.  Ms, Sazama only has one variant of uncertain significance, thus this variant is unlikely to explain her symptoms.  DLC1 c.704G>A (p.Arg235Gln) Variant of uncertain significance.  Biallelic pathogenic variants are associated with congenital heart defects, nephrotic syndrome, and neural tube defects. Hailey Thompson does not have symptoms that clearly align with this condition. This variant is unlikely to explain her symptoms.    Genetic Counseling: Hailey Thompson is a 28 y.o. female with SDHA-related Pheochromocytoma and Paraganglioma (PPGL), short stature.  For detailed HPI, please see accompanying note from Dr. [place] Haldeman Englert.  Hailey Thompson has a family history of a maternal half-sister who is deceased at age 40 from congential kidney disease and complications related to COVID infection.  Her mother, deceased at 37 yo, also had chronic kidney  disease and breast cancer diagnosed at 28 yo.  Her maternal grandmother also had breast cancer. She has several maternal aunts with short stature. Her paternal family history is largely unknown.  Hailey Thompson has had prior genetic testing related to her family history of cancer and her personal and family history of kidney disease.  Hailey Thompson previously had genetic counseling related to her diagnosis of SDHA-related Pheochromocytoma and Paraganglioma and thus I depth discussion of this result was not required.  Hailey Thompson is followed by [facility] for management of chronic kidney disease.  However, she has not had renal findings suggestive of COL4A3-related Alport syndrome and her care team does not believe this to be the cause of her chronic kidney disease.  We discussed that her previous genetic testing results likely impact her risk for pheochromocytoma, paraganglioma, and kidney disease, but do not fully explain her clinical picture of nonCOL4A3-related kidney disease, short stature, and other complex medical concerns.  Because of this, it would be reasonable to consider comprehensive genetic testing, like genome sequencing.  Genetic considerations were reviewed with the family. They are aware that we have over 20,000 genes, each with an important role in the body. All of the genes are packaged into structures called chromosomes. We have two copies of every chromosome- one that is inherited from each parent- and thus two copies of every gene. Given Hailey Thompson's features, concern for a genetic cause of her symptoms has arisen. If a specific genetic abnormality can be identified, it may help provide further insight into prognosis, management, and recurrence risk.  Given her complicated medical and developmental history, a broad approach to genetic testing is recommended. Specifically, we recommend genome sequencing (GS). Genome sequencing assesses all of the coding regions (exons) and non-coding regions  (introns) of the genes for any variants that could be associated with an individual's symptoms. It will also simultaneously assess for copy number variants, like deletions including the SHOX gene, which are a common cause of short stature.  The family is interested in pursuing this testing today and would like to know of secondary findings and incidental as well,  but not for the comparators. They are interested in being contacted about research opportunities.The consent form, possible results (positive, negative, and variant of uncertain significance), and expected timeline were reviewed. Buccal samples from Hailey Thompson's children were also collected to help determine phasing of her NPHS1 variants (due to her parents being deceased or unavailable for testing) and determine their affected status. A sample was collected today from Hailey Thompson and her children to be sent to [facility] for [employer]. Results are expected in  1-2 months, at which point we will reach out with more information.  Recommendations: [facility] Continue follow-up with other healthcare providers as recommended  Date: 01/11/2024 Total time spent: 90 minutes Genetic Counselor-only time: 45 minutes  Time spent includes face to face and non-face to face care for the patient on the date of this encounter (history, genetic counseling, coordination of care, data gathering and/or documentation as outlined).   Lum Molt MS [facility] Certified Genetic Counselor [facility] [employer]

## 2024-02-19 ENCOUNTER — Telehealth: Payer: Self-pay | Admitting: Genetic Counselor

## 2024-02-19 DIAGNOSIS — Z1589 Genetic susceptibility to other disease: Secondary | ICD-10-CM

## 2024-02-19 NOTE — Telephone Encounter (Signed)
 Attempted to call Zilpha Mcandrew to discuss results of genetic testing. Voicemail box full, unable to leave voicemail.  Will call again next week.  Kimberly Molt, MS [facility] Certified Genetic Counselor

## 2024-02-26 ENCOUNTER — Encounter: Payer: Self-pay | Admitting: Genetic Counselor

## 2024-02-26 NOTE — Telephone Encounter (Signed)
 Spoke with Hailey Thompson regarding the results of her recent genetic testing.   Hailey Thompson was seen in the [facility] clinic on 01/11/2024 at 28 yo due to a personal history of short stature and kidney failure.  After evaluation, genetic testing was ordered for Hailey Thompson including genome sequencing.   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  This finding was previously identified in Hailey Thompson and is not new.  Her previously identified pathogenic variant in SDHA, and variants of uncertain significance in CEP290, NPHS2, NF1, KANSL1, TSC2, LMNA, OFD1, NPHS1, COQ1, and DLC1 were also identified but not likely to be diagnostic.  No novel genetic findings were identified. A summary of results can be found below, as well as information regarding COL4A3-related Alport syndrome and SDHA-realted Hereditary Paraganglioma and Pheochromocytoma.  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  CEP290 c.181-2A>G (p.?) Pathogenic variant associated with carrier status for CEP290-related Ciliopathies  NPHS2 c.888G>A (p.Arg229Gln) Pathogenic variant associated with carrier status for Congenital Nephrotic syndrome.  NF1 c.3916C>A (p.Arg1306=) Variant of uncertain significance. Pathogenic variants are associated with Neurofibromatosis Type 1 (NF1) characterized by cafe au lait macules, neurofibromas, and learning disability.  Hailey Thompson does not have symptoms that clearly align with NF1.  This variant is unlikely to explain her symptoms.  KANSL1 c.741A>T (p.Arg247Ser) Mosaic variant of uncertain significance.  Pathogenic variants are associated with Noreen Estimable syndrome,  characterized by developmental delay, intellectual disability, brain abnormalities, and epilepsy.  Hailey Thompson does not have symptoms that clearly align with Noreen Estimable syndrome. This variant is unlikely to explain her symptoms.  TSC2 c.5384G>A (p.Arg1795His) Variant of uncertain significance.  Pathogenic variants are associated with Tuberous Sclerosis Complex (TSC) characterized by angiomyolipomas, beingn tumors, dermatological findings, intellectual disability, and seizures. Hailey Thompson does not have symptoms that clearly align with TSC. This variant is unlikely to explain her symptoms.  LMNA c.23G>A (p.Arg8His) Variant of uncertain significance.  Pathogenic variants are associated with LMNA-related disorders with significant extra-renal findings.  Ms. Bango does not have symptoms that clearly align with LMNA-related conditions. This variant is unlikely to explain her symptoms.  OFD1 c.755C>T (p.Ala252Val) Variant of uncertain significance. Pathogenic variants are associated with Orofaciodigital syndrome characterized by ciliary dyskinesia and abnormalities of the mouth face, and digits. Hailey Thompson does not have symptoms that clearly align with Orofaciodigital syndrome. This variant is unlikely to explain her symptoms.  NPHS1 c.1861G>T (p.Val621Leu) Variant of uncertain significance. Biallelic pathogenic variants are associated with Congenital Nephrotic syndrome.  The onset of Hailey Thompson's kidney disease occurred later than would be expected if this variant and the additional pathogenic variant in NPHS1 were causative for her symptoms.  This variant is unlikely to cause her symptoms.  COQ6 c.427G>A (p.Val143Met) Variant of uncertain significance.  Biallelic pathogenic variants are associated with Primary Coenzyme Q10 Deficiency 6.  Ms, Thompson only has one variant of uncertain significance, thus this variant is unlikely to explain her symptoms.  DLC1 c.704G>A (p.Arg235Gln) Variant of uncertain significance.   Biallelic pathogenic variants are associated with congenital heart defects, nephrotic syndrome, and neural tube defects. Hailey Thompson does not have symptoms that clearly align with this condition. This variant is unlikely to explain her symptoms.    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  This variant is a canonical splice site variant that is expected to maintain the reading frame but disrupt a significant percentage of the protein length or a critical region of the protein, or potentially introducing a premature stop codon and triggering nonsense-mediated decay.  This variant may be associated with recessive and dominant disease.  Hailey Thompson has a single pathogenic variant in COL4A3, as is found in autosomal dominant Alport syndrome (ADAS).  The penetrance of ADAS is reduced with significant variable expressivity.  Some individuals will be asymptomatic, while others may develop ESKD later in life.  The lifetime risk for ESKD due to a single COL4A3 variant is less than 3%.  Hailey Thompson is currently followed by [facility] for her chronic kidney disease; however, they do not believe that her current symptoms are associated with her diagnosis of Alport syndrome.  This condition is inherited in an autosomal dominant manner, meaning that only pathogenic variant is required to increase the risk for symptoms.  Because of this, there is a 50% chance that each of Hailey Thompson's children may have also inherited this variant.  Her son was found to also have thi variant, while her daughter did not.  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 This variant is a missense change that replaces arginine with tryptophan.  It has been observed in individuals with paragangliomas, pheochromocytomas, and GIST and in-silico tools predict a damaging effect on protein function.  This variant has a reduced penetrance of 50% by 28 yo.  This means that only have of individuals with an SDHA pathogenic variant will develop symptoms by age 53.  This condition is inherited in an autosomal dominant manner, meaning that only pathogenic variant is required to increase the risk for symptoms.  Because of this, there is a 50% chance that each of Ms. Tomasello's children may have also inherited this variant.  Neither of her children inherited this variant.  Symptoms:  Paraganglioma Pheochromocytoma Gastrointestinal stromal tumor (GIST)  Individuals with SDHA variants who develop symptoms typically have single tumors (as opposed to multiple or bilateral tumors) with a mixed biochemical phenotype and low metastatic risk.  Surveillance:  * Note: surveillance is only recommended for individuals with a pathogenic variant in SDHA AND a family history of an associated cancer/tumor. Ms. Lavergne has not reported such family history. *  Clinical assessment for manifestations of paraganglioma, pheochromocytomas, and GIST annually. Plasma-free fractionated metanephrines or 24-hour urine fractionated metanephrines (optional dopamine or 3-methoxytyramine) for secreting PGL/PCC annually. Whole-body MRI to assess for PGL, PCC, RCC, & GIST every 2-3 years. EGD for those w/unexplained anemia & GI symptoms as needed.  Ms. Samson is followed by [facility] and should continue to do so per their recommendations.  Ms. Flam  expressed understanding of these results and was encouraged to reach out with any further questions.  The test report has been released to the family and is attached to the associated order.   Kimberly Molt, MS [facility] Certified Genetic Counselor
